# Patient Record
Sex: Female | Born: 1975 | Race: White | Hispanic: No
Health system: Midwestern US, Community
[De-identification: ages and names within clinical notes are randomized; demographics above are authoritative.]

## PROBLEM LIST (undated history)

## (undated) DIAGNOSIS — I10 Essential (primary) hypertension: Secondary | ICD-10-CM

## (undated) DIAGNOSIS — R229 Localized swelling, mass and lump, unspecified: Secondary | ICD-10-CM

## (undated) DIAGNOSIS — D249 Benign neoplasm of unspecified breast: Secondary | ICD-10-CM

## (undated) DIAGNOSIS — IMO0002 Reserved for concepts with insufficient information to code with codable children: Secondary | ICD-10-CM

## (undated) DIAGNOSIS — F419 Anxiety disorder, unspecified: Secondary | ICD-10-CM

## (undated) DIAGNOSIS — T7840XA Allergy, unspecified, initial encounter: Secondary | ICD-10-CM

## (undated) DIAGNOSIS — D649 Anemia, unspecified: Secondary | ICD-10-CM

## (undated) DIAGNOSIS — S96911A Strain of unspecified muscle and tendon at ankle and foot level, right foot, initial encounter: Secondary | ICD-10-CM

## (undated) HISTORY — DX: Benign neoplasm of unspecified breast: D24.9

## (undated) HISTORY — DX: Anxiety disorder, unspecified: F41.9

## (undated) HISTORY — PX: BREAST EXCISIONAL BIOPSY: SUR124

## (undated) HISTORY — DX: Anemia, unspecified: D64.9

## (undated) HISTORY — DX: Allergy, unspecified, initial encounter: T78.40XA

---

## 1998-05-20 ENCOUNTER — Emergency Department (HOSPITAL_COMMUNITY): Admission: EM | Admit: 1998-05-20 | Discharge: 1998-05-20 | Payer: Self-pay | Admitting: Emergency Medicine

## 1998-07-16 ENCOUNTER — Emergency Department (HOSPITAL_COMMUNITY): Admission: EM | Admit: 1998-07-16 | Discharge: 1998-07-16 | Payer: Self-pay | Admitting: Emergency Medicine

## 1998-09-29 ENCOUNTER — Emergency Department (HOSPITAL_COMMUNITY): Admission: EM | Admit: 1998-09-29 | Discharge: 1998-09-29 | Payer: Self-pay | Admitting: Emergency Medicine

## 1999-09-29 ENCOUNTER — Emergency Department (HOSPITAL_COMMUNITY): Admission: EM | Admit: 1999-09-29 | Discharge: 1999-09-29 | Payer: Self-pay | Admitting: Emergency Medicine

## 2000-01-13 ENCOUNTER — Emergency Department (HOSPITAL_COMMUNITY): Admission: EM | Admit: 2000-01-13 | Discharge: 2000-01-13 | Payer: Self-pay | Admitting: Emergency Medicine

## 2000-01-22 ENCOUNTER — Emergency Department (HOSPITAL_COMMUNITY): Admission: EM | Admit: 2000-01-22 | Discharge: 2000-01-22 | Payer: Self-pay

## 2001-05-19 ENCOUNTER — Emergency Department (HOSPITAL_COMMUNITY): Admission: EM | Admit: 2001-05-19 | Discharge: 2001-05-19 | Payer: Self-pay

## 2002-10-12 ENCOUNTER — Emergency Department (HOSPITAL_COMMUNITY): Admission: EM | Admit: 2002-10-12 | Discharge: 2002-10-12 | Payer: Self-pay | Admitting: Emergency Medicine

## 2003-01-31 ENCOUNTER — Emergency Department (HOSPITAL_COMMUNITY): Admission: EM | Admit: 2003-01-31 | Discharge: 2003-01-31 | Payer: Self-pay | Admitting: Emergency Medicine

## 2003-05-03 ENCOUNTER — Ambulatory Visit (HOSPITAL_COMMUNITY): Admission: RE | Admit: 2003-05-03 | Discharge: 2003-05-03 | Payer: Self-pay | Admitting: *Deleted

## 2003-06-08 ENCOUNTER — Ambulatory Visit (HOSPITAL_COMMUNITY): Admission: RE | Admit: 2003-06-08 | Discharge: 2003-06-08 | Payer: Self-pay | Admitting: *Deleted

## 2003-06-08 ENCOUNTER — Encounter: Payer: Self-pay | Admitting: *Deleted

## 2003-09-27 ENCOUNTER — Ambulatory Visit (HOSPITAL_COMMUNITY): Admission: RE | Admit: 2003-09-27 | Discharge: 2003-09-27 | Payer: Self-pay | Admitting: *Deleted

## 2003-10-29 ENCOUNTER — Inpatient Hospital Stay (HOSPITAL_COMMUNITY): Admission: AD | Admit: 2003-10-29 | Discharge: 2003-10-31 | Payer: Self-pay | Admitting: *Deleted

## 2003-10-30 ENCOUNTER — Encounter (INDEPENDENT_AMBULATORY_CARE_PROVIDER_SITE_OTHER): Payer: Self-pay | Admitting: Specialist

## 2003-10-30 HISTORY — PX: TUBAL LIGATION: SHX77

## 2004-12-16 ENCOUNTER — Emergency Department (HOSPITAL_COMMUNITY): Admission: EM | Admit: 2004-12-16 | Discharge: 2004-12-16 | Payer: Self-pay | Admitting: Family Medicine

## 2006-02-15 ENCOUNTER — Emergency Department (HOSPITAL_COMMUNITY): Admission: EM | Admit: 2006-02-15 | Discharge: 2006-02-15 | Payer: Self-pay | Admitting: Emergency Medicine

## 2006-08-10 ENCOUNTER — Emergency Department (HOSPITAL_COMMUNITY): Admission: EM | Admit: 2006-08-10 | Discharge: 2006-08-10 | Payer: Self-pay | Admitting: Emergency Medicine

## 2007-02-22 ENCOUNTER — Emergency Department (HOSPITAL_COMMUNITY): Admission: EM | Admit: 2007-02-22 | Discharge: 2007-02-22 | Payer: Self-pay | Admitting: Family Medicine

## 2008-03-01 ENCOUNTER — Emergency Department (HOSPITAL_COMMUNITY): Admission: EM | Admit: 2008-03-01 | Discharge: 2008-03-01 | Payer: Self-pay | Admitting: Emergency Medicine

## 2008-09-26 ENCOUNTER — Emergency Department (HOSPITAL_COMMUNITY): Admission: EM | Admit: 2008-09-26 | Discharge: 2008-09-26 | Payer: Self-pay | Admitting: Family Medicine

## 2009-05-30 ENCOUNTER — Ambulatory Visit (HOSPITAL_COMMUNITY): Admission: RE | Admit: 2009-05-30 | Discharge: 2009-05-30 | Payer: Self-pay | Admitting: Family Medicine

## 2010-02-20 ENCOUNTER — Emergency Department (HOSPITAL_COMMUNITY): Admission: EM | Admit: 2010-02-20 | Discharge: 2010-02-20 | Payer: Self-pay | Admitting: Family Medicine

## 2010-10-07 ENCOUNTER — Encounter: Payer: Self-pay | Admitting: Cardiology

## 2010-10-07 ENCOUNTER — Encounter: Payer: Self-pay | Admitting: Family Medicine

## 2011-01-31 NOTE — Op Note (Signed)
NAME:  Pamela Allen, HADDOX                         ACCOUNT NO.:  [id number]   MEDICAL RECORD NO.:  [id number]                   PATIENT TYPE:  INP   LOCATION:  9124                                 FACILITY:  [facility]   PHYSICIAN:  Phil D. Okey Dupre, M.D.                  DATE OF BIRTH:  [birth year]   DATE OF PROCEDURE:  10/30/2003  DATE OF DISCHARGE:                                 OPERATIVE REPORT   PREOPERATIVE DIAGNOSIS:  Multiparity.   POSTOPERATIVE DIAGNOSIS:  Multiparity with voluntary sterilization.   PROCEDURE:  Bilateral tubal ligation and partial bilateral salpingectomy.   SURGEON:  Javier Glazier. Rose, M.D.   ESTIMATED BLOOD LOSS:  Less than 5 mL.   ANESTHESIA:  Epidural.   DESCRIPTION OF PROCEDURE:  Under satisfactory epidural anesthesia, with the  patient in the dorsal supine position, the abdomen was prepped and draped in  the usual sterile manner and entered through a 5 cm subumbilical incision.  By entering the peritoneal cavity, the fallopian tubes were identified,  grasped from the mid point and opening made in an avascular portion of the  meso beneath the tube and through that, brought a #1 plain suture which was  tighter over the distal and proximal ends of the tube to form a loop of  approximately 2 cm above the tie.  A second tie was placed just below the  aforementioned tie and cut short.  The first tie, which was held with the  hemostat was also cut short and a portion of the tube above the first tie  was excised and sent for pathological diagnosis and the exposed ends of the  tube which was held by the hemostat were coagulated with hot cautery.  The  ureters were observed for bleeding and none was noted.  The tubes were  replaced in the peritoneal cavity and the fascia and peritoneum closed with  a continuous running 0 Vicryl on an atraumatic needle.  This was run to a  subcutaneous suture closure.  The subcuticular of 3-0 Monocryl was used for  subcuticular closure.  Dry  sterile dressing applied.  The patient  transferred to the recovery room in satisfactory condition having tolerated  the procedure well.                                               Phil D. Okey Dupre, M.D.    PDR/MEDQ  D:  10/30/2003  T:  10/30/2003  Job:  [id number]

## 2011-11-21 ENCOUNTER — Ambulatory Visit (INDEPENDENT_AMBULATORY_CARE_PROVIDER_SITE_OTHER): Payer: BC Managed Care – PPO | Admitting: Family Medicine

## 2011-11-21 ENCOUNTER — Telehealth: Payer: Self-pay | Admitting: Family Medicine

## 2011-11-21 DIAGNOSIS — L0390 Cellulitis, unspecified: Secondary | ICD-10-CM

## 2011-11-21 DIAGNOSIS — L03119 Cellulitis of unspecified part of limb: Secondary | ICD-10-CM

## 2011-11-21 DIAGNOSIS — I10 Essential (primary) hypertension: Secondary | ICD-10-CM | POA: Insufficient documentation

## 2011-11-21 DIAGNOSIS — L03115 Cellulitis of right lower limb: Secondary | ICD-10-CM

## 2011-11-21 MED ORDER — ACETAMINOPHEN-CODEINE #3 300-30 MG PO TABS: 1.0000 | ORAL_TABLET | Freq: Four times a day (QID) | ORAL | Status: AC | PRN

## 2011-11-21 MED ORDER — DOXYCYCLINE HYCLATE 100 MG PO TABS: 100.0000 mg | ORAL_TABLET | Freq: Two times a day (BID) | ORAL | Status: AC

## 2011-11-21 NOTE — Progress Notes (Signed)
  Subjective:    Patient ID: Pamela Allen, female    DOB: 1976/08/13, 36 y.o.   MRN: [id number]  HPI  Patient presents with a 3-4 day history of a painful lesion on her (R) lower extremity. Today with more redness and swelling.  Pain with ambulation.  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  Neck: Neck supple.  Cardiovascular: Normal rate, regular rhythm and normal heart sounds.   Pulmonary/Chest: Effort normal and breath sounds normal.  Musculoskeletal: Normal range of motion.  Neurological: She is alert.  Skin:         Informed consent obtained; anesthesia provided with ethyl chloride; I+D performed with 18 gauge needle.  Wound culture obtained. Patient tolerated procedure without complications    Assessment & Plan:   1. HTN (hypertension)    2. Cellulitis of right leg will small abscess;  Status post  I+D doxycycline (VIBRA-TABS) 100 MG tablet, acetaminophen-codeine (TYLENOL #3) 300-30 MG per tablet, Wound culture, Tdap vaccine greater than or equal to 7yo IM   Silvadene dressing placed. Wound care reviewed Call with follow up in 48 hours, sooner with worsening symptoms.  Patient in agreement with  plan

## 2011-11-21 NOTE — Telephone Encounter (Signed)
Rx for tylenol #3 printed and not given, will call into [facility].

## 2011-11-24 LAB — WOUND CULTURE: Gram Stain: NONE SEEN

## 2011-12-13 ENCOUNTER — Inpatient Hospital Stay: Admit: 2011-12-13 | Discharge: 2011-12-13

## 2011-12-13 MED ORDER — IBUPROFEN 800 MG PO TABS
800 MG | Freq: Once | ORAL | Status: AC
Start: 2011-12-13 — End: 2011-12-13
  Administered 2011-12-13: 22:00:00 via ORAL

## 2011-12-13 MED FILL — IBUPROFEN 800 MG PO TABS: 800 MG | ORAL | Qty: 1

## 2011-12-13 NOTE — Discharge Instructions (Signed)
Contusions  A contusion is an area of tenderness and swelling in the soft tissues. It may also be called a deep bruise. A contusion is the result of damage and a small amount of bleeding in an injured area. Severe contusions may stay painful and swollen for a few weeks following injury.    HOME CARE INSTRUCTIONS   Rest the injured area until the pain and swelling are better.    Apply ice packs every few hours for 2 to 3 days, then use moist heat for comfort.    Elevate the injury to reduce swelling.    Compression bandages may also help reduce swelling and motion, which may help control pain.   A larger collection of blood may form in the deep tissue (hematoma). This may form with a large contusion. Hematomas are usually reabsorbed by the body naturally. Sometimes they need to be drained.   SEEK IMMEDIATE MEDICAL CARE IF YOU DEVELOP:   Signs of infection (increased redness, swelling, or pain).    Numbness or coldness to the injured area.   MAKE SURE YOU:    Understand these instructions.    Will watch your condition.    Will get help right away if you are not doing well or get worse.   Document Released: 10/09/2004 Document Re-Released: 11/26/2009  [employer] Patient Information 2012 [employer].
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
## 2011-12-13 NOTE — ED Notes (Signed)
Derek PA in room to examine pt. No drug screen / BAT test required per Lt. In room with pt.    Cherylin Mylar, RN  12/13/11 [phone]

## 2011-12-13 NOTE — ED Notes (Signed)
RUE:AV40<JW> Expected date:<BR> Expected time:<BR> Means of arrival:<BR> Comments:<BR>

## 2011-12-13 NOTE — ED Notes (Signed)
Pt to x-ray per ambulation.    Regina Potter, [place]  12/13/11 [phone]
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
## 2012-04-17 ENCOUNTER — Ambulatory Visit (INDEPENDENT_AMBULATORY_CARE_PROVIDER_SITE_OTHER): Payer: BC Managed Care – PPO | Admitting: Family Medicine

## 2012-04-17 VITALS — BP 133/86 | HR 99 | Temp 99.0°F | Resp 16 | Ht 62.5 in | Wt 184.0 lb

## 2012-04-17 DIAGNOSIS — R070 Pain in throat: Secondary | ICD-10-CM

## 2012-04-17 DIAGNOSIS — E0789 Other specified disorders of thyroid: Secondary | ICD-10-CM

## 2012-04-17 LAB — POCT CBC
Granulocyte percent: 55.8 %G (ref 37–80)
Hemoglobin: 12.2 g/dL (ref 12.2–16.2)
MPV: 9.3 fL (ref 0–99.8)
POC Granulocyte: 5.4 (ref 2–6.9)
POC MID %: 6.5 %M (ref 0–12)
Platelet Count, POC: 328 10*3/uL (ref 142–424)
RBC: 4.22 M/uL (ref 4.04–5.48)

## 2012-04-17 LAB — POCT RAPID STREP A (OFFICE): Rapid Strep A Screen: NEGATIVE

## 2012-04-17 MED ORDER — PREDNISONE 20 MG PO TABS: ORAL_TABLET | ORAL | Status: AC

## 2012-04-17 NOTE — Progress Notes (Signed)
Subjective: Patient been having pain in her right side of her neck since yesterday. It's not like her usual sore throat. No fever. It hurts coughs or swallows hard.  Objective: TMs normal. Throat clear. Neck supple without nodes. He is tender in the right side of her thyroid. Chest clear. Heart regular without murmurs. Strep screen was taken anyhow.  Assessment: Throat pain Thyroid pain Possible thyroiditis  Plan: Strep screen and labs Results for orders placed in visit on 04/17/12  POCT CBC      Component Value Range   WBC 9.7  4.6 - 10.2 K/uL   Lymph, poc 3.7 (*) 0.6 - 3.4   POC LYMPH PERCENT 37.7  10 - 50 %L   MID (cbc) 0.6  0 - 0.9   POC MID % 6.5  0 - 12 %M   POC Granulocyte 5.4  2 - 6.9   Granulocyte percent 55.8  37 - 80 %G   RBC 4.22  4.04 - 5.48 M/uL   Hemoglobin 12.2  12.2 - 16.2 g/dL   HCT, POC 16.1  09.6 - 47.9 %   MCV 94.7  80 - 97 fL   MCH, POC 28.9  27 - 31.2 pg   MCHC 30.5 (*) 31.8 - 35.4 g/dL   RDW, POC 04.5     Platelet Count, POC 328  142 - 424 K/uL   MPV 9.3  0 - 99.8 fL  POCT RAPID STREP A (OFFICE)      Component Value Range   Rapid Strep A Screen Negative  Negative   Sedimentation rate is pending. Will give her a brief course of steroids and see if that helps. All other labs are pending.

## 2012-04-17 NOTE — Patient Instructions (Addendum)
Take medication as ordered. I will let you know the results of your other labs in a few days. If problems persist we may need to do further testing on the thyroid. I think this is a possible thyroiditis.  If not improving over the next week come back and get rechecked.

## 2012-04-18 LAB — THYROID PANEL WITH TSH
Free Thyroxine Index: 2.4 (ref 1.0–3.9)
T3 Uptake: 32.4 % (ref 22.5–37.0)
T4, Total: 7.3 ug/dL (ref 5.0–12.5)

## 2012-04-19 ENCOUNTER — Encounter: Payer: Self-pay | Admitting: Radiology

## 2012-06-11 ENCOUNTER — Ambulatory Visit: Payer: BC Managed Care – PPO

## 2012-06-11 ENCOUNTER — Ambulatory Visit (INDEPENDENT_AMBULATORY_CARE_PROVIDER_SITE_OTHER): Payer: BC Managed Care – PPO | Admitting: Emergency Medicine

## 2012-06-11 VITALS — BP 118/72 | HR 85 | Temp 98.5°F | Resp 16 | Ht 62.5 in | Wt 185.0 lb

## 2012-06-11 DIAGNOSIS — R079 Chest pain, unspecified: Secondary | ICD-10-CM

## 2012-06-11 MED ORDER — HYDROCODONE-ACETAMINOPHEN 5-325 MG PO TABS: 1.0000 | ORAL_TABLET | ORAL | Status: AC | PRN

## 2012-06-11 MED ORDER — NAPROXEN SODIUM 550 MG PO TABS: 550.0000 mg | ORAL_TABLET | Freq: Two times a day (BID) | ORAL | Status: DC

## 2012-06-11 NOTE — Progress Notes (Signed)
Subjective:    Patient ID: Pamela Allen, female    DOB: 1976/01/10, 36 y.o.   MRN: [id number]  HPI Comments: Comes and goes "for a minute or so" over last two weeks.  No history of injury or overuse.    Chest Pain  This is a new problem. The current episode started 1 to 4 weeks ago. The onset quality is sudden. The problem occurs 2 to 4 times per day. The problem has been unchanged. The pain is present in the lateral region. The pain is at a severity of 5/10. The pain is moderate. The quality of the pain is described as sharp and stabbing. The pain does not radiate. Pertinent negatives include no abdominal pain, back pain, claudication, cough, diaphoresis, dizziness, exertional chest pressure, fever, headaches, hemoptysis, irregular heartbeat, leg pain, lower extremity edema, malaise/fatigue, nausea, near-syncope, numbness, orthopnea, palpitations, PND, shortness of breath, sputum production, syncope, vomiting or weakness. The pain is aggravated by nothing. She has tried nothing for the symptoms. Risk factors include obesity.  Her past medical history is significant for hypertension.  Pertinent negatives for past medical history include no aneurysm, no anxiety/panic attacks, no aortic aneurysm, no aortic dissection, no arrhythmia, no bicuspid aortic valve, no CAD, no cancer, no congenital heart disease, no connective tissue disease, no COPD, no CHF, no diabetes, no DVT, no hyperlipidemia, no Kawasaki disease, no Marfan's syndrome, no MI, no mitral valve prolapse, no pacemaker, no PE, no PVD, no recent injury, no rheumatic fever, no seizures, no sickle cell disease, no sleep apnea, no spontaneous pneumothorax, no stimulant use, no strokes, no TIA, Turner syndrome and no valve disorder.  Her family medical history is significant for diabetes in family.  Pertinent negatives for family medical history include: family history of aortic dissection, no CAD in family, no connective tissue disease in family, no  heart disease in family, no hyperlipidemia in family, no hypertension in family, no Marfan's syndrome in family, no early MI in family, no PE in family, no PVD in family, no sickle cell disease in family, no stroke in family, no sudden death in family and no TIA in family.      Review of Systems  Constitutional: Negative.  Negative for fever, malaise/fatigue and diaphoresis.  Eyes: Negative.   Respiratory: Negative.  Negative for cough, hemoptysis, sputum production and shortness of breath.   Cardiovascular: Positive for chest pain. Negative for palpitations, orthopnea, claudication, syncope, PND and near-syncope.  Gastrointestinal: Negative.  Negative for nausea, vomiting and abdominal pain.  Genitourinary: Negative.   Musculoskeletal: Negative.  Negative for back pain.  Neurological: Negative.  Negative for dizziness, seizures, weakness, numbness and headaches.       Objective:   Physical Exam  Constitutional: She is oriented to person, place, and time. She appears well-developed and well-nourished.  HENT:  Head: Normocephalic and atraumatic.  Eyes: Conjunctivae normal are normal. Pupils are equal, round, and reactive to light. No scleral icterus.  Neck: Normal range of motion. Neck supple.  Cardiovascular: Normal rate, regular rhythm and normal heart sounds.   Pulmonary/Chest: Effort normal and breath sounds normal. No respiratory distress. She has no wheezes. She has no rales. She exhibits tenderness.  Abdominal: Soft. Bowel sounds are normal. She exhibits no distension.  Musculoskeletal: Normal range of motion.  Neurological: She is alert and oriented to person, place, and time.  Skin: Skin is warm and dry.          Assessment & Plan:  Pleuritic chest pain CXR  [facility] reading (  PRIMARY) by  Dr. Dareen Piano.  negative.  Meds ordered this encounter  Medications  . HYDROcodone-acetaminophen (NORCO) 5-325 MG per tablet    Sig: Take 1-2 tablets by mouth every 4 (four) hours as  needed for pain.    Dispense:  30 tablet    Refill:  0  . naproxen sodium (ANAPROX DS) 550 MG tablet    Sig: Take 1 tablet (550 mg total) by mouth 2 (two) times daily with a meal.    Dispense:  40 tablet    Refill:  0   I have reviewed and agree with documentation. Robert P. Merla Riches, M.D.

## 2013-04-30 ENCOUNTER — Encounter: Payer: Self-pay | Admitting: Family Medicine

## 2013-04-30 ENCOUNTER — Ambulatory Visit (INDEPENDENT_AMBULATORY_CARE_PROVIDER_SITE_OTHER): Payer: Managed Care, Other (non HMO) | Admitting: Family Medicine

## 2013-04-30 VITALS — BP 134/90 | HR 82 | Temp 99.0°F | Resp 16 | Ht 62.0 in | Wt 186.0 lb

## 2013-04-30 DIAGNOSIS — I10 Essential (primary) hypertension: Secondary | ICD-10-CM

## 2013-04-30 DIAGNOSIS — E669 Obesity, unspecified: Secondary | ICD-10-CM

## 2013-04-30 DIAGNOSIS — Z79899 Other long term (current) drug therapy: Secondary | ICD-10-CM

## 2013-04-30 MED ORDER — AMLODIPINE BESYLATE-VALSARTAN 10-320 MG PO TABS: 1.0000 | ORAL_TABLET | Freq: Every day | ORAL | Status: DC

## 2013-04-30 NOTE — Progress Notes (Signed)
  Subjective:    Patient ID: Pamela Allen, female    DOB: 15-Mar-1976, 37 y.o.   MRN: [id number] Chief Complaint  Patient presents with  . Medication Refill    ran out of bp med two days ago   HPI  Has switched her PCP to [facility] but her year long rx from her prior PCP just ran out.  Does check her BP at home and always runs about 130/89.  Here today with her fiance.  No problems tolerating the exforge 5/320. Has never been on a higher dose of it.  Plans to make an appt at 104 for a full CPE soon - is not fasting today.  History reviewed. No pertinent past medical history. No current outpatient prescriptions on file prior to visit.   No current facility-administered medications on file prior to visit.   No Known Allergies   Review of Systems  Constitutional: Negative for fever, chills, diaphoresis and appetite change.  Eyes: Negative for visual disturbance.  Respiratory: Negative for cough and shortness of breath.   Cardiovascular: Negative for chest pain, palpitations and leg swelling.  Genitourinary: Negative for decreased urine volume.  Neurological: Negative for syncope and headaches.  Hematological: Does not bruise/bleed easily.      BP 134/90  Pulse 82  Temp(Src) 99 F (37.2 C) (Oral)  Resp 16  Ht 5\' 2"  (1.575 m)  Wt 186 lb (84.369 kg)  BMI 34.01 kg/m2  SpO2 100%  LMP 04/21/2013 Objective:   Physical Exam  Constitutional: She is oriented to person, place, and time. She appears well-developed and well-nourished. No distress.  HENT:  Head: Normocephalic and atraumatic.  Right Ear: External ear normal.  Left Ear: External ear normal.  Eyes: Conjunctivae are normal. No scleral icterus.  Neck: Normal range of motion. Neck supple. No thyromegaly present.  Cardiovascular: Normal rate, regular rhythm, normal heart sounds and intact distal pulses.   Pulmonary/Chest: Effort normal and breath sounds normal. No respiratory distress.  Musculoskeletal: She exhibits no edema.   Lymphadenopathy:    She has no cervical adenopathy.  Neurological: She is alert and oriented to person, place, and time.  Skin: Skin is warm and dry. She is not diaphoretic. No erythema.  Psychiatric: She has a normal mood and affect. Her behavior is normal.      Assessment & Plan:  HTN (hypertension) - increase exforge from 5/320 to 10/320 - decrease back to prior dose if more lower ext edema.  Encounter for long-term (current) use of other medications - Plan: Comprehensive metabolic panel  Obesity, unspecified - Plan: TSH  Meds ordered this encounter  Medications  . amLODipine-valsartan (EXFORGE) 10-320 MG per tablet    Sig: Take 1 tablet by mouth daily.    Dispense:  90 tablet    Refill:  3   F/u at 104 within the next yr for BP recheck and CPE.

## 2013-04-30 NOTE — Patient Instructions (Addendum)
Continue checking your blood pressure at home about once a week. Make sure your arm is resting on the arm of the couch or chair or on the table and you have been sitting relaxed for several minutes before taking it. We want your blood pressure to be <120/80 on both upper and lower numbers.  DASH Diet The DASH diet stands for "Dietary Approaches to Stop Hypertension." It is a healthy eating plan that has been shown to reduce high blood pressure (hypertension) in as little as 14 days, while also possibly providing other significant health benefits. These other health benefits include reducing the risk of breast cancer after menopause and reducing the risk of type 2 diabetes, heart disease, colon cancer, and stroke. Health benefits also include weight loss and slowing kidney failure in patients with chronic kidney disease.  DIET GUIDELINES  Limit salt (sodium). Your diet should contain less than 1500 mg of sodium daily.  Limit refined or processed carbohydrates. Your diet should include mostly whole grains. Desserts and added sugars should be used sparingly.  Include small amounts of heart-healthy fats. These types of fats include nuts, oils, and tub margarine. Limit saturated and trans fats. These fats have been shown to be harmful in the body. CHOOSING FOODS  The following food groups are based on a 2000 calorie diet. See your Registered Dietitian for individual calorie needs. Grains and Grain Products (6 to 8 servings daily)  Eat More Often: Whole-wheat bread, brown rice, whole-grain or wheat pasta, quinoa, popcorn without added fat or salt (air popped).  Eat Less Often: White bread, white pasta, white rice, cornbread. Vegetables (4 to 5 servings daily)  Eat More Often: Fresh, frozen, and canned vegetables. Vegetables may be raw, steamed, roasted, or grilled with a minimal amount of fat.  Eat Less Often/Avoid: Creamed or fried vegetables. Vegetables in a cheese sauce. Fruit (4 to 5 servings  daily)  Eat More Often: All fresh, canned (in natural juice), or frozen fruits. Dried fruits without added sugar. One hundred percent fruit juice ( cup [237 mL] daily).  Eat Less Often: Dried fruits with added sugar. Canned fruit in light or heavy syrup. [employer], Fish, and Poultry (2 servings or less daily. One serving is 3 to 4 oz [85-114 g]).  Eat More Often: Ninety percent or leaner ground beef, tenderloin, sirloin. Round cuts of beef, chicken breast, [place] breast. All fish. Grill, bake, or broil your meat. Nothing should be fried.  Eat Less Often/Avoid: Fatty cuts of meat, [place], or chicken leg, thigh, or wing. Fried cuts of meat or fish. Dairy (2 to 3 servings)  Eat More Often: Low-fat or fat-free milk, low-fat plain or light yogurt, reduced-fat or part-skim cheese.  Eat Less Often/Avoid: Milk (whole, 2%).Whole milk yogurt. Full-fat cheeses. Nuts, Seeds, and Legumes (4 to 5 servings per week)  Eat More Often: All without added salt.  Eat Less Often/Avoid: Salted nuts and seeds, canned beans with added salt. Fats and Sweets (limited)  Eat More Often: Vegetable oils, tub margarines without trans fats, sugar-free gelatin. Mayonnaise and salad dressings.  Eat Less Often/Avoid: Coconut oils, palm oils, butter, stick margarine, cream, half and half, cookies, candy, pie. FOR MORE INFORMATION The Dash Diet Eating Plan: www.dashdiet.org Document Released: 08/21/2011 Document Revised: 11/24/2011 Document Reviewed: 08/21/2011 [facility] Patient Information 2014 [place], [place].

## 2013-05-01 LAB — COMPREHENSIVE METABOLIC PANEL
ALT: 16 U/L (ref 0–35)
AST: 16 U/L (ref 0–37)
Albumin: 4.4 g/dL (ref 3.5–5.2)
Alkaline Phosphatase: 45 U/L (ref 39–117)
Calcium: 9.3 mg/dL (ref 8.4–10.5)
Chloride: 103 mEq/L (ref 96–112)
Potassium: 4.1 mEq/L (ref 3.5–5.3)

## 2013-05-02 ENCOUNTER — Encounter: Payer: Self-pay | Admitting: Family Medicine

## 2013-08-04 IMAGING — CR DG CHEST 2V
2 series · 2 of 2 positions shown · non-contrast
Comparison: 12/16/2004.

CLINICAL DATA: Intermittent left chest pain.

CHEST - 2 VIEW

[PA]
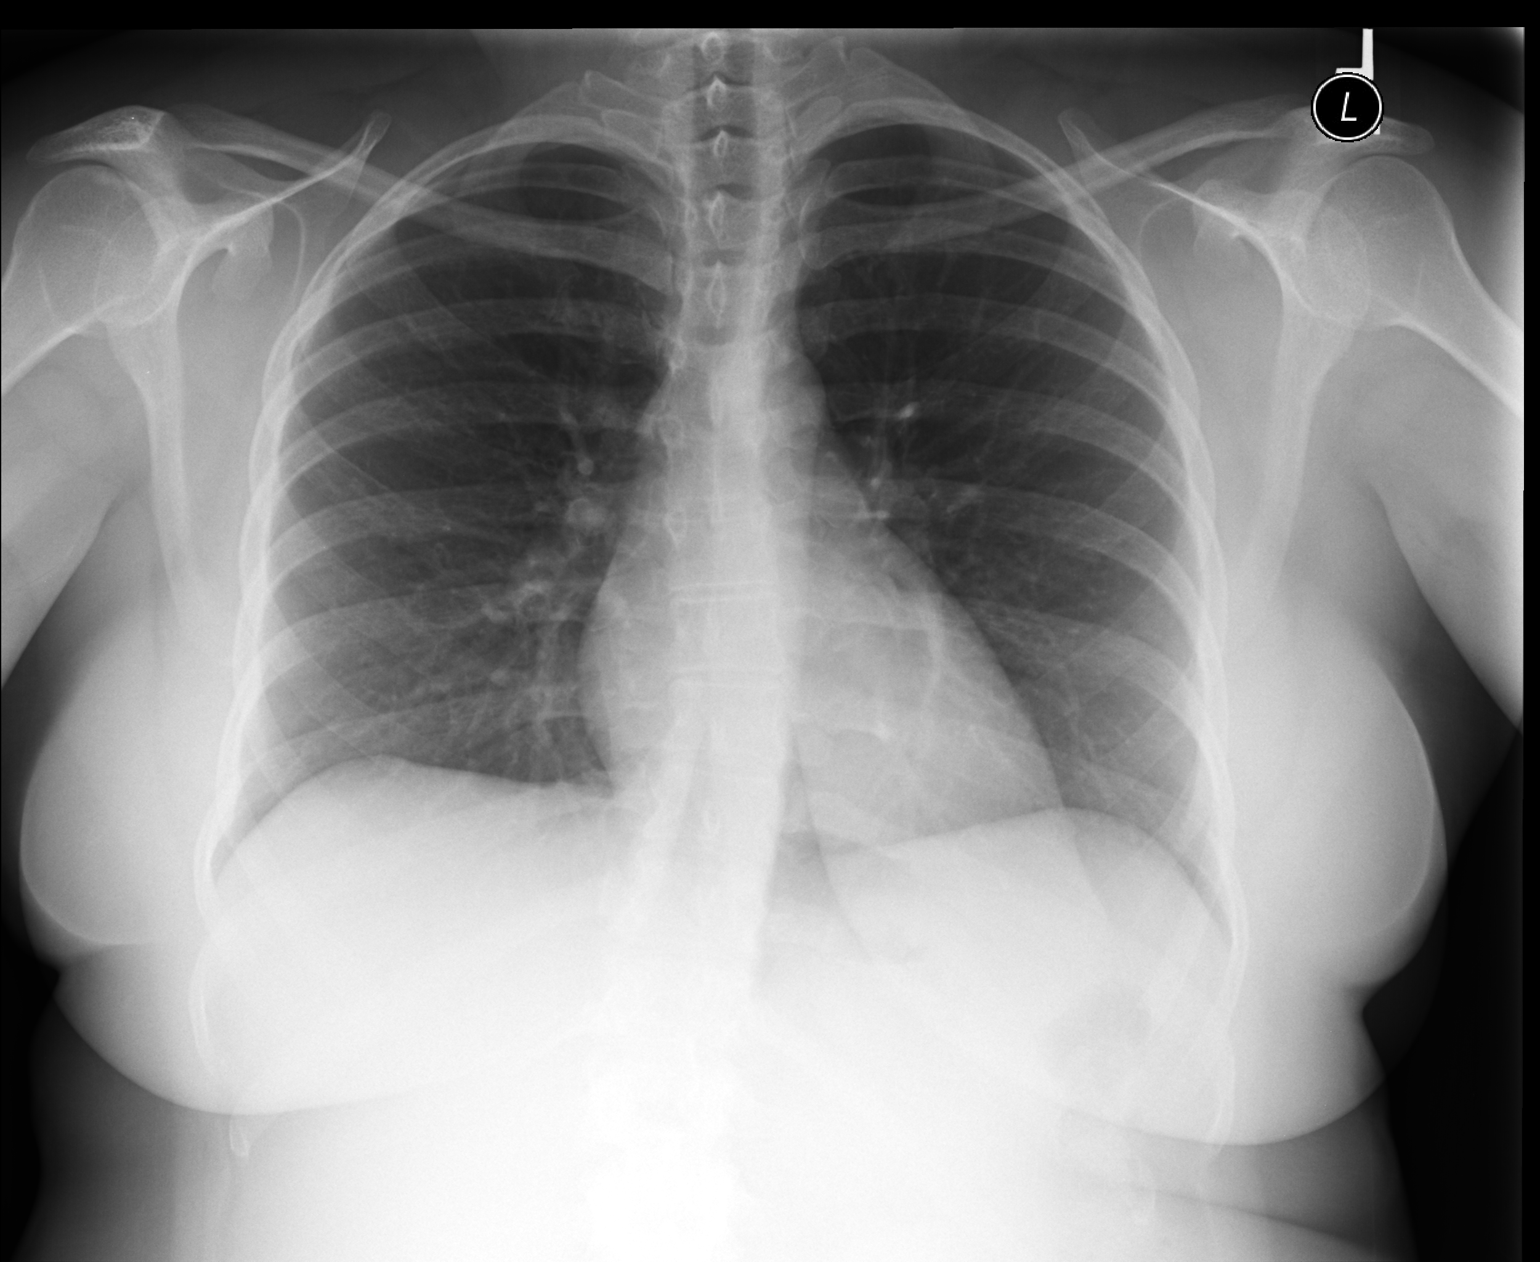

[lateral]
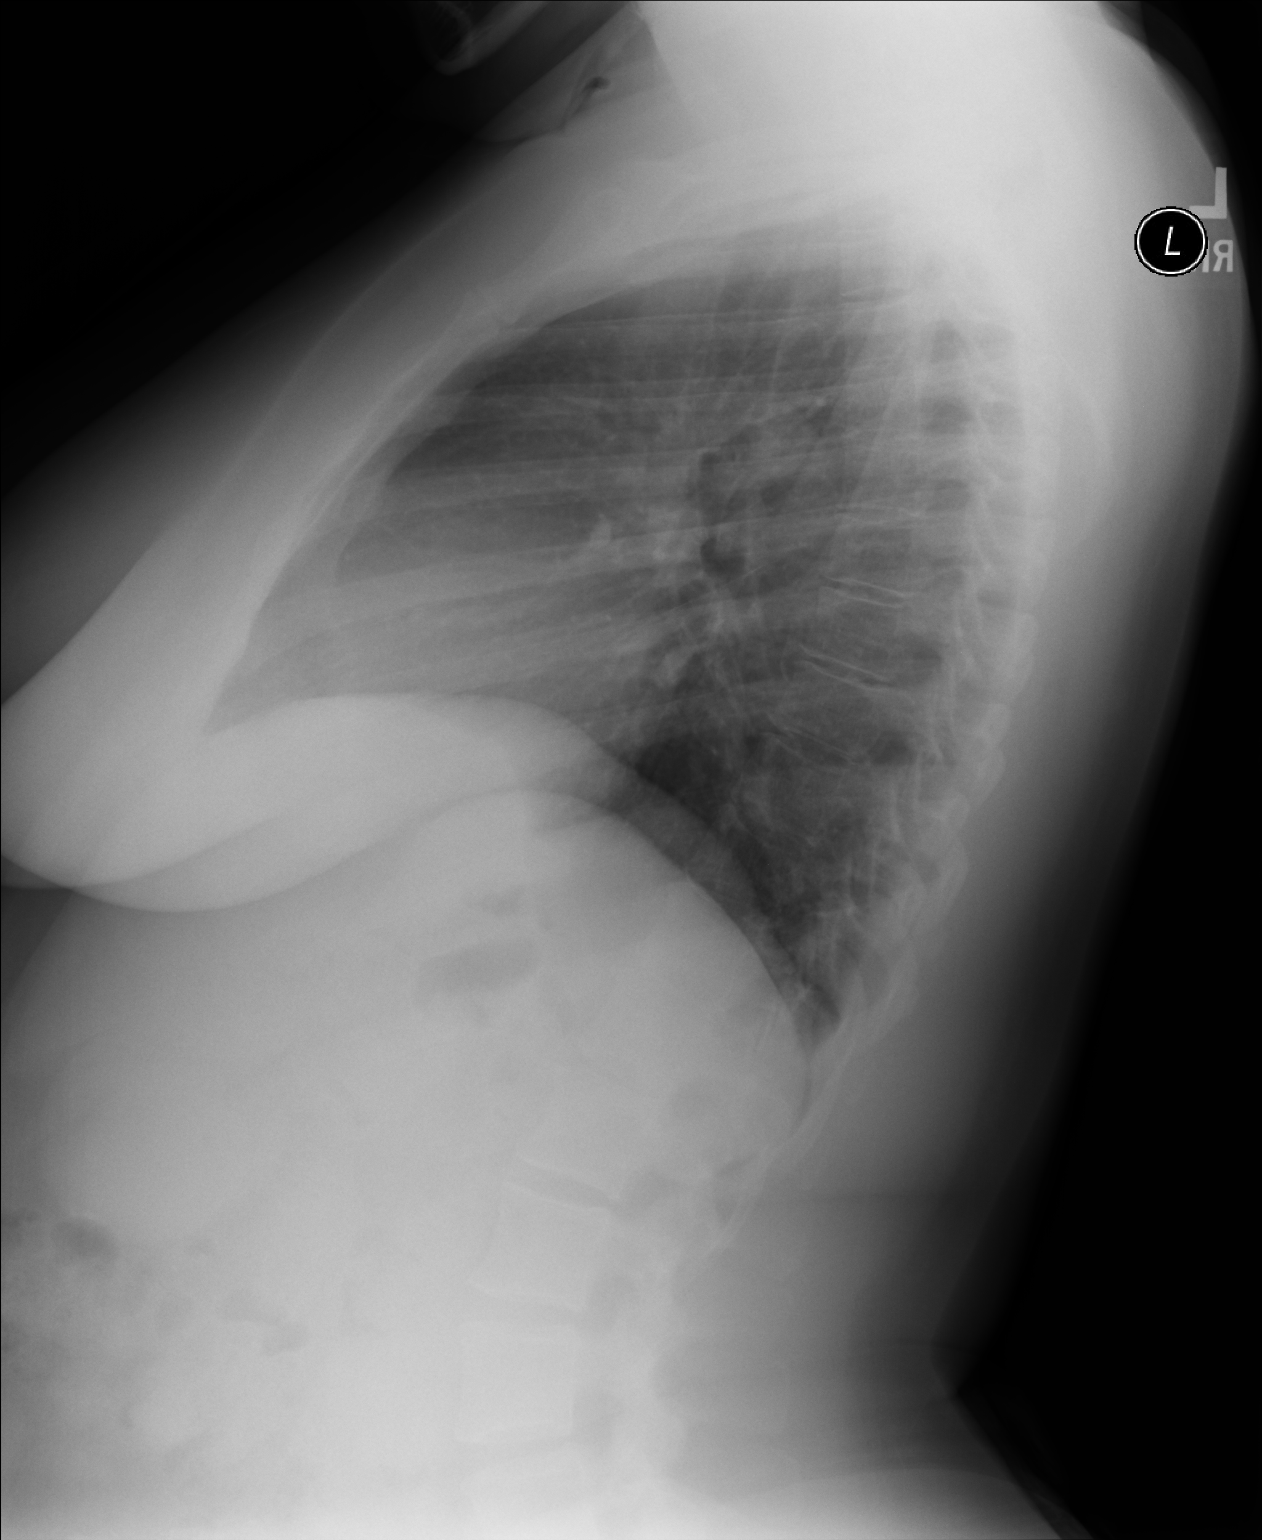

[2 of 2 positions shown; findings below may reference images not displayed]

FINDINGS: Trachea is midline.  Heart size normal.  Lungs are clear.
No pleural fluid.
IMPRESSION: No acute findings.

## 2013-11-07 DIAGNOSIS — G5600 Carpal tunnel syndrome, unspecified upper limb: Secondary | ICD-10-CM | POA: Insufficient documentation

## 2013-11-07 DIAGNOSIS — R209 Unspecified disturbances of skin sensation: Secondary | ICD-10-CM | POA: Insufficient documentation

## 2013-11-07 DIAGNOSIS — I10 Essential (primary) hypertension: Secondary | ICD-10-CM | POA: Insufficient documentation

## 2013-11-07 DIAGNOSIS — Z79899 Other long term (current) drug therapy: Secondary | ICD-10-CM | POA: Insufficient documentation

## 2013-11-08 ENCOUNTER — Encounter (HOSPITAL_COMMUNITY): Payer: Self-pay | Admitting: Emergency Medicine

## 2013-11-08 ENCOUNTER — Emergency Department (HOSPITAL_COMMUNITY)
Admission: EM | Admit: 2013-11-08 | Discharge: 2013-11-08 | Disposition: A | Discharge status: Home / Self Care | Payer: Managed Care, Other (non HMO) | Attending: Emergency Medicine | Admitting: Emergency Medicine

## 2013-11-08 DIAGNOSIS — G5602 Carpal tunnel syndrome, left upper limb: Secondary | ICD-10-CM

## 2013-11-08 HISTORY — DX: Essential (primary) hypertension: I10

## 2013-11-08 MED ORDER — NAPROXEN 500 MG PO TABS: 500.0000 mg | ORAL_TABLET | Freq: Two times a day (BID) | ORAL | Status: DC

## 2013-11-08 NOTE — ED Provider Notes (Signed)
CSN: [id number]     Arrival date & time 11/07/13  2322 History   First MD Initiated Contact with Patient 11/08/13 [phone]     Chief Complaint  Patient presents with  . Arm Pain     (Consider location/radiation/quality/duration/timing/severity/associated sxs/prior Treatment) HPI Comments: Patient states, that she's noticed for the past several weeks, that she's had some discomfort in the medial aspect of her right wrist.  That has progressively gotten worse for the last 2, days.  She's had discomfort.  That radiated up into the upper portion of her right arm.  At making in nature, cannot be reproduced with palpation, but with certain movements, has pain stops at the shoulder.  She has tried Advil with some relief.  Denies any injury. She says, occasionally, she'll feel a tingly sensation, like her arm is asleep, but has full range of motion and function  Patient is a 38 y.o. female presenting with arm pain. The history is provided by the patient.  Arm Pain This is a new problem. The current episode started in the past 7 days. The problem occurs constantly. The problem has been gradually worsening. Associated symptoms include numbness. Pertinent negatives include no joint swelling, rash or weakness. The symptoms are aggravated by exertion. She has tried NSAIDs for the symptoms. The treatment provided mild relief.    Past Medical History  Diagnosis Date  . Hypertension    History reviewed. No pertinent past surgical history. History reviewed. No pertinent family history. History  Substance Use Topics  . Smoking status: Never Smoker   . Smokeless tobacco: Not on file  . Alcohol Use: No   OB History   Grav Para Term Preterm Abortions TAB SAB Ect Mult Living                 Review of Systems  Musculoskeletal: Negative for joint swelling.  Skin: Negative for rash and wound.  Neurological: Positive for numbness. Negative for weakness.      Allergies  Review of patient's allergies  indicates no known allergies.  Home Medications   Current Outpatient Rx  Name  Route  Sig  Dispense  Refill  . amLODipine-valsartan (EXFORGE) 10-320 MG per tablet   Oral   Take 1 tablet by mouth daily.   90 tablet   3   . naproxen (NAPROSYN) 500 MG tablet   Oral   Take 1 tablet (500 mg total) by mouth 2 (two) times daily.   30 tablet   0    BP 155/96  Pulse 85  Temp(Src) 98.1 F (36.7 C) (Oral)  Resp 20  SpO2 100%  LMP 10/10/2013 Physical Exam  Nursing note and vitals reviewed. Constitutional: She is oriented to person, place, and time. She appears well-developed and well-nourished.  HENT:  Head: Normocephalic.  Eyes: Pupils are equal, round, and reactive to light.  Neck: Normal range of motion.  Cardiovascular: Normal rate.   Pulmonary/Chest: Effort normal.  Musculoskeletal: Normal range of motion. She exhibits tenderness. She exhibits no edema.       Right wrist: She exhibits tenderness. She exhibits no bony tenderness.  Positive Tinel sign  Neurological: She is alert and oriented to person, place, and time.  Skin: Skin is warm. No rash noted. No erythema.    ED Course  Procedures (including critical care time) Labs Review Labs Reviewed - No data to display Imaging Review No results found.  EKG Interpretation   None       MDM   Final diagnoses:  Carpal tunnel syndrome on left        Garald Balding, NP 11/08/13 2047  Garald Balding, NP 11/08/13 2047

## 2013-11-08 NOTE — ED Provider Notes (Signed)
Medical screening examination/treatment/procedure(s) were performed by non-physician practitioner and as supervising physician I was immediately available for consultation/collaboration.    Teressa Lower, MD 11/08/13 [phone]

## 2013-11-08 NOTE — ED Notes (Signed)
Pt complains of right arm numbness that started earlier today

## 2013-11-08 NOTE — Discharge Instructions (Signed)
Carpal Tunnel Syndrome The carpal tunnel is an area under the skin of the palm of your hand. Nerves, blood vessels, and strong tissues (tendons) pass through the tunnel. The tunnel can become puffy (swollen). If this happens, a nerve can be pinched in the wrist. This causes carpal tunnel syndrome.  HOME CARE  Take all medicine as told by your doctor.  If you were given a splint, wear it as told. Wear it at night or at times when your doctor told you to.  Rest your wrist from the activity that causes your pain.  Put ice on your wrist after long periods of wrist activity.  Put ice in a plastic bag.  Place a towel between your skin and the bag.  Leave the ice on for 15-20 minutes, 03-04 times a day.  Keep all doctor visits as told. GET HELP RIGHT AWAY IF:  You have new problems you cannot explain.  Your problems get worse and medicine does not help. MAKE SURE YOU:   Understand these instructions.  Will watch your condition.  Will get help right away if you are not doing well or get worse. Document Released: 08/21/2011 Document Revised: 11/24/2011 Document Reviewed: 08/21/2011 [facility] Patient Information 2014 [place], [place]. Please wear the splint provided 24 hours a day for the next week and then while up and about and at work.  He may remove it at night.  He may remove it to bed during the first week, but it should be worn at all times.  If this does not improve your discomfort.  Please make an appointment with Dr. Grandville Silos for further evaluation

## 2014-01-03 ENCOUNTER — Ambulatory Visit (INDEPENDENT_AMBULATORY_CARE_PROVIDER_SITE_OTHER): Payer: Managed Care, Other (non HMO) | Admitting: Emergency Medicine

## 2014-01-03 VITALS — BP 134/80 | HR 86 | Temp 98.0°F | Resp 16 | Ht 65.0 in | Wt 190.0 lb

## 2014-01-03 DIAGNOSIS — L259 Unspecified contact dermatitis, unspecified cause: Secondary | ICD-10-CM

## 2014-01-03 MED ORDER — TRIAMCINOLONE ACETONIDE 0.1 % EX CREA: 1.0000 "application " | TOPICAL_CREAM | Freq: Two times a day (BID) | CUTANEOUS | Status: DC

## 2014-01-03 MED ORDER — METHYLPREDNISOLONE ACETATE 80 MG/ML IJ SUSP
120.0000 mg | Freq: Once | INTRAMUSCULAR | Status: AC
  Administered 2014-01-03: 120 mg via INTRAMUSCULAR

## 2014-01-03 NOTE — Patient Instructions (Signed)
ContactContact Dermatitis Contact dermatitis is a reaction to certain substances that touch the skin. Contact dermatitis can be either irritant contact dermatitis or allergic contact dermatitis. Irritant contact dermatitis does not require previous exposure to the substance for a reaction to occur.Allergic contact dermatitis only occurs if you have been exposed to the substance before. Upon a repeat exposure, your body reacts to the substance.  CAUSES  Many substances can cause contact dermatitis. Irritant dermatitis is most commonly caused by repeated exposure to mildly irritating substances, such as:  Makeup.  Soaps.  Detergents.  Bleaches.  Acids.  Metal salts, such as nickel. Allergic contact dermatitis is most commonly caused by exposure to:  Poisonous plants.  Chemicals (deodorants, shampoos).  Jewelry.  Latex.  Neomycin in triple antibiotic cream.  Preservatives in products, including clothing. SYMPTOMS  The area of skin that is exposed may develop:  Dryness or flaking.  Redness.  Cracks.  Itching.  Pain or a burning sensation.  Blisters. With allergic contact dermatitis, there may also be swelling in areas such as the eyelids, mouth, or genitals.  DIAGNOSIS  Your caregiver can usually tell what the problem is by doing a physical exam. In cases where the cause is uncertain and an allergic contact dermatitis is suspected, a patch skin test may be performed to help determine the cause of your dermatitis. TREATMENT Treatment includes protecting the skin from further contact with the irritating substance by avoiding that substance if possible. Barrier creams, powders, and gloves may be helpful. Your caregiver may also recommend:  Steroid creams or ointments applied 2 times daily. For best results, soak the rash area in cool water for 20 minutes. Then apply the medicine. Cover the area with a plastic wrap. You can store the steroid cream in the refrigerator for a  "chilly" effect on your rash. That may decrease itching. Oral steroid medicines may be needed in more severe cases.  Antibiotics or antibacterial ointments if a skin infection is present.  Antihistamine lotion or an antihistamine taken by mouth to ease itching.  Lubricants to keep moisture in your skin.  Burow's solution to reduce redness and soreness or to dry a weeping rash. Mix one packet or tablet of solution in 2 cups cool water. Dip a clean washcloth in the mixture, wring it out a bit, and put it on the affected area. Leave the cloth in place for 30 minutes. Do this as often as possible throughout the day.  Taking several cornstarch or baking soda baths daily if the area is too large to cover with a washcloth. Harsh chemicals, such as alkalis or acids, can cause skin damage that is like a burn. You should flush your skin for 15 to 20 minutes with cold water after such an exposure. You should also seek immediate medical care after exposure. Bandages (dressings), antibiotics, and pain medicine may be needed for severely irritated skin.  HOME CARE INSTRUCTIONS  Avoid the substance that caused your reaction.  Keep the area of skin that is affected away from hot water, soap, sunlight, chemicals, acidic substances, or anything else that would irritate your skin.  Do not scratch the rash. Scratching may cause the rash to become infected.  You may take cool baths to help stop the itching.  Only take over-the-counter or prescription medicines as directed by your caregiver.  See your caregiver for follow-up care as directed to make sure your skin is healing properly. SEEK MEDICAL CARE IF:   Your condition is not better after 3  days of treatment.  You seem to be getting worse.  You see signs of infection such as swelling, tenderness, redness, soreness, or warmth in the affected area.  You have any problems related to your medicines. Document Released: 08/29/2000 Document Revised:  11/24/2011 Document Reviewed: 02/04/2011 [facility] Patient Information 2014 [place], [place].

## 2014-01-03 NOTE — Progress Notes (Signed)
[facility] [address], [place] [address] [phone]  Date:  01/03/2014   Name:  Pamela Allen   DOB:  [birth year]   MRN:  [id number]  PCP:  Delman Cheadle, MD    Chief Complaint: Rash   History of Present Illness:  Pamela Allen is a 38 y.o. very pleasant female patient who presents with the following:  Used a weight loss band the last two days.  How has a rather intense red and swollen and highly pruritic rash in the area of the rubber band.  No dysphagia or difficulty swallowing or breathing. No cough or coryza.  No improvement with over the counter medications or other home remedies. Denies other complaint or health concern today.   Patient Active Problem List   Diagnosis Date Noted  . HTN (hypertension) 11/21/2011    Past Medical History  Diagnosis Date  . Hypertension     No past surgical history on file.  History  Substance Use Topics  . Smoking status: Never Smoker   . Smokeless tobacco: Not on file  . Alcohol Use: No    No family history on file.  No Known Allergies  Medication list has been reviewed and updated.  Current Outpatient Prescriptions on File Prior to Visit  Medication Sig Dispense Refill  . amLODipine-valsartan (EXFORGE) 10-320 MG per tablet Take 1 tablet by mouth daily.  90 tablet  3  . naproxen (NAPROSYN) 500 MG tablet Take 1 tablet (500 mg total) by mouth 2 (two) times daily.  30 tablet  0   No current facility-administered medications on file prior to visit.    Review of Systems:  As per HPI, otherwise negative.    Physical Examination: Filed Vitals:   01/03/14 1908  BP: 134/80  Pulse: 86  Temp: 98 F (36.7 C)  Resp: 16   Filed Vitals:   01/03/14 1908  Height: 5' 5"  (1.651 m)  Weight: 190 lb (86.183 kg)   Body mass index is 31.62 kg/(m^2). Ideal Body Weight: Weight in (lb) to have BMI = 25: 149.9  GEN: WDWN, NAD, Non-toxic, A & O x 3 HEENT: Atraumatic, Normocephalic. Neck supple. No masses,  No LAD. Ears and Nose: No external deformity. CV: RRR, No M/G/R. No JVD. No thrill. No extra heart sounds. PULM: CTA B, no wheezes, crackles, rhonchi. No retractions. No resp. distress. No accessory muscle use. ABD: S, NT, ND, +BS. No rebound. No HSM. EXTR: No c/c/e NEURO Normal gait.  PSYCH: Normally interactive. Conversant. Not depressed or anxious appearing.  Calm demeanor.  SKIn:  Band of erythematous, swollen area.    Assessment and Plan: Contact dermatitis ?latex Depo medrol TAC  Signed,  Ellison Carwin, MD

## 2014-01-04 ENCOUNTER — Telehealth: Payer: Self-pay

## 2014-01-04 NOTE — Telephone Encounter (Signed)
Give it a week!

## 2014-01-04 NOTE — Telephone Encounter (Signed)
Patient was seen by Dr. Ouida Sills for rash.  She has been using the cream that was prescribed and taking the benadryl, but the itching and redness is worsening.  Please advise.

## 2014-01-04 NOTE — Telephone Encounter (Signed)
Patient called stated she seen Dr. Ouida Sills on 01/03/14. Patient stated she have rash on back and abdomen area. Rash is still itching and irritating patient. Please call patient at work [phone] ext:[phone]

## 2014-01-06 NOTE — Telephone Encounter (Signed)
Spoke to patient.  Advised it can take up to one week for sx to subside. Patient stated that someone had called her yesterday.  No phone message was documented.

## 2014-05-05 ENCOUNTER — Other Ambulatory Visit: Payer: Self-pay | Admitting: Family Medicine

## 2014-08-26 ENCOUNTER — Other Ambulatory Visit: Payer: Self-pay | Admitting: Family Medicine

## 2014-09-22 ENCOUNTER — Telehealth: Payer: Self-pay

## 2014-09-22 NOTE — Telephone Encounter (Signed)
Patient is requesting a refill on "Exforge". Patient is scheduled for her CPE with Dr Brigitte Pulse on 11/10/14. She is requesting it to be sent to [facility] in [place] and her call back number is [phone]

## 2014-09-23 MED ORDER — AMLODIPINE BESYLATE-VALSARTAN 10-320 MG PO TABS: 1.0000 | ORAL_TABLET | Freq: Every day | ORAL | Status: DC

## 2014-09-23 NOTE — Telephone Encounter (Signed)
Done and pt notified.

## 2014-11-10 ENCOUNTER — Ambulatory Visit (INDEPENDENT_AMBULATORY_CARE_PROVIDER_SITE_OTHER): Payer: Managed Care, Other (non HMO) | Admitting: Family Medicine

## 2014-11-10 ENCOUNTER — Encounter: Payer: Self-pay | Admitting: Family Medicine

## 2014-11-10 VITALS — BP 131/85 | HR 73 | Temp 98.5°F | Resp 16 | Ht 62.5 in | Wt 184.4 lb

## 2014-11-10 DIAGNOSIS — E559 Vitamin D deficiency, unspecified: Secondary | ICD-10-CM

## 2014-11-10 DIAGNOSIS — F4321 Adjustment disorder with depressed mood: Secondary | ICD-10-CM

## 2014-11-10 DIAGNOSIS — Z0000 Encounter for general adult medical examination without abnormal findings: Secondary | ICD-10-CM

## 2014-11-10 DIAGNOSIS — I10 Essential (primary) hypertension: Secondary | ICD-10-CM

## 2014-11-10 DIAGNOSIS — Z0189 Encounter for other specified special examinations: Secondary | ICD-10-CM

## 2014-11-10 DIAGNOSIS — Z5181 Encounter for therapeutic drug level monitoring: Secondary | ICD-10-CM

## 2014-11-10 DIAGNOSIS — R5383 Other fatigue: Secondary | ICD-10-CM

## 2014-11-10 DIAGNOSIS — F4320 Adjustment disorder, unspecified: Secondary | ICD-10-CM

## 2014-11-10 DIAGNOSIS — Z1239 Encounter for other screening for malignant neoplasm of breast: Secondary | ICD-10-CM

## 2014-11-10 LAB — CBC WITH DIFFERENTIAL/PLATELET
BASOS PCT: 0 % (ref 0–1)
Basophils Absolute: 0 10*3/uL (ref 0.0–0.1)
EOS ABS: 0.1 10*3/uL (ref 0.0–0.7)
EOS PCT: 1 % (ref 0–5)
HEMATOCRIT: 35.7 % — AB (ref 36.0–46.0)
HEMOGLOBIN: 11.6 g/dL — AB (ref 12.0–15.0)
Lymphocytes Relative: 47 % — ABNORMAL HIGH (ref 12–46)
Lymphs Abs: 3.2 10*3/uL (ref 0.7–4.0)
MCH: 29.7 pg (ref 26.0–34.0)
MCHC: 32.5 g/dL (ref 30.0–36.0)
MCV: 91.5 fL (ref 78.0–100.0)
MONO ABS: 0.5 10*3/uL (ref 0.1–1.0)
MONOS PCT: 7 % (ref 3–12)
MPV: 10.2 fL (ref 8.6–12.4)
NEUTROS PCT: 45 % (ref 43–77)
Neutro Abs: 3.1 10*3/uL (ref 1.7–7.7)
Platelets: 331 10*3/uL (ref 150–400)
RBC: 3.9 MIL/uL (ref 3.87–5.11)
RDW: 12.7 % (ref 11.5–15.5)
WBC: 6.9 10*3/uL (ref 4.0–10.5)

## 2014-11-10 LAB — COMPREHENSIVE METABOLIC PANEL
ALK PHOS: 44 U/L (ref 39–117)
ALT: 15 U/L (ref 0–35)
AST: 16 U/L (ref 0–37)
Albumin: 4.3 g/dL (ref 3.5–5.2)
BILIRUBIN TOTAL: 0.3 mg/dL (ref 0.2–1.2)
BUN: 9 mg/dL (ref 6–23)
CHLORIDE: 105 meq/L (ref 96–112)
CO2: 22 mEq/L (ref 19–32)
Calcium: 9.1 mg/dL (ref 8.4–10.5)
Creat: 0.99 mg/dL (ref 0.50–1.10)
GLUCOSE: 95 mg/dL (ref 70–99)
Potassium: 4.2 mEq/L (ref 3.5–5.3)
Sodium: 137 mEq/L (ref 135–145)
Total Protein: 7.4 g/dL (ref 6.0–8.3)

## 2014-11-10 LAB — LDL CHOLESTEROL, DIRECT: Direct LDL: 94 mg/dL

## 2014-11-10 LAB — HIV ANTIBODY (ROUTINE TESTING W REFLEX): HIV 1&2 Ab, 4th Generation: NONREACTIVE

## 2014-11-10 LAB — TSH: TSH: 2.475 u[IU]/mL (ref 0.350–4.500)

## 2014-11-10 MED ORDER — AMLODIPINE BESYLATE-VALSARTAN 10-320 MG PO TABS: 1.0000 | ORAL_TABLET | Freq: Every day | ORAL | Status: DC

## 2014-11-10 NOTE — Patient Instructions (Signed)
Stress and Stress Management Stress is a normal reaction to life events. It is what you feel when life demands more than you are used to or more than you can handle. Some stress can be useful. For example, the stress reaction can help you catch the last bus of the day, study for a test, or meet a deadline at work. But stress that occurs too often or for too long can cause problems. It can affect your emotional health and interfere with relationships and normal daily activities. Too much stress can weaken your immune system and increase your risk for physical illness. If you already have a medical problem, stress can make it worse. CAUSES  All sorts of life events may cause stress. An event that causes stress for one person may not be stressful for another person. Major life events commonly cause stress. These may be positive or negative. Examples include losing your job, moving into a new home, getting married, having a baby, or losing a loved one. Less obvious life events may also cause stress, especially if they occur day after day or in combination. Examples include working long hours, driving in traffic, caring for children, being in debt, or being in a difficult relationship. SIGNS AND SYMPTOMS Stress may cause emotional symptoms including, the following:  Anxiety. This is feeling worried, afraid, on edge, overwhelmed, or out of control.  Anger. This is feeling irritated or impatient.  Depression. This is feeling sad, down, helpless, or guilty.  Difficulty focusing, remembering, or making decisions. Stress may cause physical symptoms, including the following:   Aches and pains. These may affect your head, neck, back, stomach, or other areas of your body.  Tight muscles or clenched jaw.  Low energy or trouble sleeping. Stress may cause unhealthy behaviors, including the following:   Eating to feel better (overeating) or skipping meals.  Sleeping too little, too much, or both.  Working  too much or putting off tasks (procrastination).  Smoking, drinking alcohol, or using drugs to feel better. DIAGNOSIS  Stress is diagnosed through an assessment by your health care provider. Your health care provider will ask questions about your symptoms and any stressful life events.Your health care provider will also ask about your medical history and may order blood tests or other tests. Certain medical conditions and medicine can cause physical symptoms similar to stress. Mental illness can cause emotional symptoms and unhealthy behaviors similar to stress. Your health care provider may refer you to a mental health professional for further evaluation.  TREATMENT  Stress management is the recommended treatment for stress.The goals of stress management are reducing stressful life events and coping with stress in healthy ways.  Techniques for reducing stressful life events include the following:  Stress identification. Self-monitor for stress and identify what causes stress for you. These skills may help you to avoid some stressful events.  Time management. Set your priorities, keep a calendar of events, and learn to say "no." These tools can help you avoid making too many commitments. Techniques for coping with stress include the following:  Rethinking the problem. Try to think realistically about stressful events rather than ignoring them or overreacting. Try to find the positives in a stressful situation rather than focusing on the negatives.  Exercise. Physical exercise can release both physical and emotional tension. The key is to find a form of exercise you enjoy and do it regularly.  Relaxation techniques. These relax the body and mind. Examples include yoga, meditation, tai chi, biofeedback, deep  breathing, progressive muscle relaxation, listening to music, being out in nature, journaling, and other hobbies. Again, the key is to find one or more that you enjoy and can do  regularly.  Healthy lifestyle. Eat a balanced diet, get plenty of sleep, and do not smoke. Avoid using alcohol or drugs to relax.  Strong support network. Spend time with family, friends, or other people you enjoy being around.Express your feelings and talk things over with someone you trust. Counseling or talktherapy with a mental health professional may be helpful if you are having difficulty managing stress on your own. Medicine is typically not recommended for the treatment of stress.Talk to your health care provider if you think you need medicine for symptoms of stress. HOME CARE INSTRUCTIONS  Keep all follow-up visits as directed by your health care provider.  Take all medicines as directed by your health care provider. SEEK MEDICAL CARE IF:  Your symptoms get worse or you start having new symptoms.  You feel overwhelmed by your problems and can no longer manage them on your own. SEEK IMMEDIATE MEDICAL CARE IF:  You feel like hurting yourself or someone else. Document Released: 02/25/2001 Document Revised: 01/16/2014 Document Reviewed: 04/26/2013 [facility] Patient Information 2015 [place], [place]. This information is not intended to replace advice given to you by your health care provider. Make sure you discuss any questions you have with your health care provider. Adjustment Disorder Most changes in life can cause stress. Getting used to changes may take a few months or longer. If feelings of stress, hopelessness, or worry continue, you may have an adjustment disorder. This stress-related mental health problem may affect your feelings, thinking and how you act. It occurs in both sexes and happens at any age. SYMPTOMS  Some of the following problems may be seen and vary from person to person:  Sadness or depression.  Loss of enjoyment.  Thoughts of suicide.  Fighting.  Avoiding family and friends.  Poor school performance.  Hopelessness, sense of loss.  Trouble  sleeping.  Vandalism.  Worry, weight loss or gain.  Crying spells.  Anxiety  Reckless driving.  Skipping school.  Poor work Systems analyst.  Nervousness.  Ignoring bills.  Poor attitude. DIAGNOSIS  Your caregiver will ask what has happened in your life and do a physical exam. They will make a diagnosis of an adjustment disorder when they are sure another problem or medical illness causing your feelings does not exist. TREATMENT  When problems caused by stress interfere with you daily life or last longer than a few months, you may need counseling for an adjustment disorder. Early treatment may diminish problems and help you to better cope with the stressful events in your life. Sometimes medication is necessary. Individual counseling and or support groups can be very helpful. PROGNOSIS  Adjustment disorders usually last less than 3 to 6 months. The condition may persist if there is long lasting stress. This could include health problems, relationship problems, or job difficulties where you can not easily escape from what is causing the problem. PREVENTION  Even the most mentally healthy, highly functioning people can suffer from an adjustment disorder given a significant blow from a life-changing event. There is no way to prevent pain and loss. Most people need help from time to time. You are not alone. SEEK MEDICAL CARE IF:  Your feelings or symptoms listed above do not improve or worsen. Document Released: 05/06/2006 Document Revised: 11/24/2011 Document Reviewed: 07/28/2007 [facility] Patient Information 2015 [place], [place]. This information  is not intended to replace advice given to you by your health care provider. Make sure you discuss any questions you have with your health care provider. Managing Your High Blood Pressure Blood pressure is a measurement of how forceful your blood is pressing against the walls of the arteries. Arteries are muscular tubes within the circulatory  system. Blood pressure does not stay the same. Blood pressure rises when you are active, excited, or nervous; and it lowers during sleep and relaxation. If the numbers measuring your blood pressure stay above normal most of the time, you are at risk for health problems. High blood pressure (hypertension) is a long-term (chronic) condition in which blood pressure is elevated. A blood pressure reading is recorded as two numbers, such as 120 over 80 (or 120/80). The first, higher number is called the systolic pressure. It is a measure of the pressure in your arteries as the heart beats. The second, lower number is called the diastolic pressure. It is a measure of the pressure in your arteries as the heart relaxes between beats.  Keeping your blood pressure in a normal range is important to your overall health and prevention of health problems, such as heart disease and stroke. When your blood pressure is uncontrolled, your heart has to work harder than normal. High blood pressure is a very common condition in adults because blood pressure tends to rise with age. Men and women are equally likely to have hypertension but at different times in life. Before age 56, men are more likely to have hypertension. After 40 years of age, women are more likely to have it. Hypertension is especially common in African Americans. This condition often has no signs or symptoms. The cause of the condition is usually not known. Your caregiver can help you come up with a plan to keep your blood pressure in a normal, healthy range. BLOOD PRESSURE STAGES Blood pressure is classified into four stages: normal, prehypertension, stage 1, and stage 2. Your blood pressure reading will be used to determine what type of treatment, if any, is necessary. Appropriate treatment options are tied to these four stages:  Normal  Systolic pressure (mm Hg): below 120.  Diastolic pressure (mm Hg): below 80. Prehypertension  Systolic pressure (mm  Hg): 120 to 139.  Diastolic pressure (mm Hg): 80 to 89. Stage1  Systolic pressure (mm Hg): 140 to 159.  Diastolic pressure (mm Hg): 90 to 99. Stage2  Systolic pressure (mm Hg): 160 or above.  Diastolic pressure (mm Hg): 100 or above. RISKS RELATED TO HIGH BLOOD PRESSURE Managing your blood pressure is an important responsibility. Uncontrolled high blood pressure can lead to:  A heart attack.  A stroke.  A weakened blood vessel (aneurysm).  Heart failure.  Kidney damage.  Eye damage.  Metabolic syndrome.  Memory and concentration problems. HOW TO MANAGE YOUR BLOOD PRESSURE Blood pressure can be managed effectively with lifestyle changes and medicines (if needed). Your caregiver will help you come up with a plan to bring your blood pressure within a normal range. Your plan should include the following: Education  Read all information provided by your caregivers about how to control blood pressure.  Educate yourself on the latest guidelines and treatment recommendations. New research is always being done to further define the risks and treatments for high blood pressure. Lifestylechanges  Control your weight.  Avoid smoking.  Stay physically active.  Reduce the amount of salt in your diet.  Reduce stress.  Control any chronic conditions, such  as high cholesterol or diabetes.  Reduce your alcohol intake. Medicines  Several medicines (antihypertensive medicines) are available, if needed, to bring blood pressure within a normal range. Communication  Review all the medicines you take with your caregiver because there may be side effects or interactions.  Talk with your caregiver about your diet, exercise habits, and other lifestyle factors that may be contributing to high blood pressure.  See your caregiver regularly. Your caregiver can help you create and adjust your plan for managing high blood pressure. RECOMMENDATIONS FOR TREATMENT AND FOLLOW-UP  The  following recommendations are based on current guidelines for managing high blood pressure in nonpregnant adults. Use these recommendations to identify the proper follow-up period or treatment option based on your blood pressure reading. You can discuss these options with your caregiver.  Systolic pressure of 536 to 468 or diastolic pressure of 80 to 89: Follow up with your caregiver as directed.  Systolic pressure of 032 to 122 or diastolic pressure of 90 to 100: Follow up with your caregiver within 2 months.  Systolic pressure above 482 or diastolic pressure above 500: Follow up with your caregiver within 1 month.  Systolic pressure above 370 or diastolic pressure above 488: Consider antihypertensive therapy; follow up with your caregiver within 1 week.  Systolic pressure above 891 or diastolic pressure above 694: Begin antihypertensive therapy; follow up with your caregiver within 1 week. Document Released: 05/26/2012 Document Reviewed: 05/26/2012 [facility] Patient Information 2015 [place]. This information is not intended to replace advice given to you by your health care provider. Make sure you discuss any questions you have with your health care provider.

## 2014-11-10 NOTE — Progress Notes (Deleted)
   Subjective:    Patient ID: Pamela Allen, female    DOB: [birth year], 39 y.o.   MRN: [id number]  HPI    Review of Systems  Constitutional: Negative.   HENT: Negative.   Eyes: Negative.   Respiratory: Negative.   Cardiovascular: Negative.   Gastrointestinal: Negative.   Endocrine: Negative.   Genitourinary: Negative.   Musculoskeletal: Negative.   Skin: Negative.   Allergic/Immunologic: Negative.   Neurological: Negative.   Hematological: Negative.   Psychiatric/Behavioral: Negative.        Objective:   Physical Exam        Assessment & Plan:

## 2014-11-10 NOTE — Progress Notes (Signed)
Subjective:  This chart was scribed for Pamela Knapp, MD by Tamsen Roers, at [facility].  This patient was seen in room 26 and the patient's care was started at 11:00 AM.   Chief Complaint  Patient presents with  . Annual Exam    with pap     Patient ID: Pamela Allen, female    DOB: March 01, 1976, 39 y.o.   MRN: [id number]  HPI  HPI Comments: Pamela Allen is a 39 y.o. female who presents to [facility] for blood pressure medication.  She is not having any issues with her blood pressure medication, checks it at home ( usually 128/82).  She has been on Amlodipine valsartan for years and states that it works well for her.  Patients last pap smear 2 years ago without any abnormalities. Patient goes to the eye doctor once a year (last in December).  She has already eaten today.  She has not had a mammogram yet and she is up to date with her shots.  Patient does not Vitamin D or Calcium supplements, nor does she drink milk. Patients husband is in the hospital (ICU) and has stage 4 Pancreatic cancer and states she is not getting much sleep lately.  She is not currently working and has children (11 and 17).  Patient has no other complaints or concerns today.    Past Medical History  Diagnosis Date  . Hypertension     Current Outpatient Prescriptions on File Prior to Visit  Medication Sig Dispense Refill  . amLODipine-valsartan (EXFORGE) 10-320 MG per tablet Take 1 tablet by mouth daily. NO MORE REFILLS WITHOUT OFFICE VISIT - 2ND NOTICE 30 tablet 1  . naproxen (NAPROSYN) 500 MG tablet Take 1 tablet (500 mg total) by mouth 2 (two) times daily. 30 tablet 0  . triamcinolone cream (KENALOG) 0.1 % Apply 1 application topically 2 (two) times daily. 60 g 1   No current facility-administered medications on file prior to visit.    No Known Allergies  Past Surgical History  Procedure Laterality Date  . Tubal ligation     Family History  Problem  Relation Age of Onset  . Diabetes Mother   . Heart disease Mother   . Hypertension Mother   . Stroke Mother   . Cancer Father   . Hypertension Father   . Stroke Sister    History   Social History  . Marital Status: Single    Spouse Name: N/A  . Number of Children: N/A  . Years of Education: N/A   Social History Main Topics  . Smoking status: Never Smoker   . Smokeless tobacco: Not on file  . Alcohol Use: No  . Drug Use: Not on file  . Sexual Activity: Not on file   Other Topics Concern  . None   Social History Narrative   Married   Cytogeneticist / CNA    Review of Systems  All other systems reviewed and are negative.  BP 131/85 mmHg  Pulse 73  Temp(Src) 98.5 F (36.9 C) (Oral)  Resp 16  Ht 5' 2.5" (1.588 m)  Wt 184 lb 6.4 oz (83.643 kg)  BMI 33.17 kg/m2  SpO2 99%  LMP 10/23/2014     Objective:   Physical Exam  Constitutional: She is oriented to person, place, and time. She appears well-developed and well-nourished. No distress.  HENT:  Head: Normocephalic and atraumatic.  Eyes: Conjunctivae and  EOM are normal.  Neck: Neck supple.  Cardiovascular: Normal rate, regular rhythm, S1 normal, S2 normal and normal heart sounds.   No murmur heard. Pulmonary/Chest: Effort normal and breath sounds normal. No respiratory distress. She has no wheezes. She has no rales.  Musculoskeletal: Normal range of motion.  Neurological: She is alert and oriented to person, place, and time.  Skin: Skin is warm and dry.  Psychiatric: She has a normal mood and affect. Her behavior is normal.  Nursing note and vitals reviewed.   Results for orders placed or performed in visit on 04/30/13  Comprehensive metabolic panel  Result Value Ref Range   Sodium 137 135 - 145 mEq/L   Potassium 4.1 3.5 - 5.3 mEq/L   Chloride 103 96 - 112 mEq/L   CO2 27 19 - 32 mEq/L   Glucose, Bld 94 70 - 99 mg/dL   BUN 11 6 - 23 mg/dL   Creat 1.16 (H) 0.50 - 1.10 mg/dL   Total  Bilirubin 0.4 0.3 - 1.2 mg/dL   Alkaline Phosphatase 45 39 - 117 U/L   AST 16 0 - 37 U/L   ALT 16 0 - 35 U/L   Total Protein 7.4 6.0 - 8.3 g/dL   Albumin 4.4 3.5 - 5.2 g/dL   Calcium 9.3 8.4 - 10.5 mg/dL  TSH  Result Value Ref Range   TSH 1.400 0.350 - 4.500 uIU/mL         Assessment & Plan:   Essential hypertension, benign - Plan: LDL Cholesterol, Direct  Wellness examination - Plan: CBC with Differential/Platelet, Comprehensive metabolic panel, TSH, Vit D  25 hydroxy (rtn osteoporosis monitoring), LDL Cholesterol, Direct, HIV antibody (with reflex) - nml pap 2 yrs prior w/ no h/o abnml so repeat in 1 yr - not currently sexually active due to husbands illness.  Medication monitoring encounter - Plan: Comprehensive metabolic panel  Screening for breast cancer - Plan: MM Digital Diagnostic Bilat  Other fatigue - Plan: CBC with Differential/Platelet, TSH, Vit D  25 hydroxy (rtn osteoporosis monitoring), HIV antibody (with reflex)  Vitamin D deficiency - start high dose replacement x 6 mos  Anticipatory grieving - husband in [place] ICU for end-stage pancreatic cancer - pt taking him home with hospice tomorrow - has 11 yo and 97 yo sons so pt advised that if she has any needs - inc poor sleep or anxiety - just to call and will try to avoid her needing to come in for OV if possible  Meds ordered this encounter  Medications  . amLODipine-valsartan (EXFORGE) 10-320 MG per tablet    Sig: Take 1 tablet by mouth daily.    Dispense:  90 tablet    Refill:  3    I personally performed the services described in this documentation, which was scribed in my presence. The recorded information has been reviewed and considered, and addended by me as needed.  Delman Cheadle, MD MPH

## 2014-11-11 ENCOUNTER — Encounter: Payer: Self-pay | Admitting: Family Medicine

## 2014-11-11 DIAGNOSIS — E559 Vitamin D deficiency, unspecified: Secondary | ICD-10-CM | POA: Insufficient documentation

## 2014-11-11 LAB — VITAMIN D 25 HYDROXY (VIT D DEFICIENCY, FRACTURES): Vit D, 25-Hydroxy: 7 ng/mL — ABNORMAL LOW (ref 30–100)

## 2014-11-11 MED ORDER — ERGOCALCIFEROL 1.25 MG (50000 UT) PO CAPS: 50000.0000 [IU] | ORAL_CAPSULE | ORAL | Status: DC

## 2014-11-14 ENCOUNTER — Other Ambulatory Visit: Payer: Self-pay | Admitting: Family Medicine

## 2014-11-14 DIAGNOSIS — Z1231 Encounter for screening mammogram for malignant neoplasm of breast: Secondary | ICD-10-CM

## 2014-11-17 ENCOUNTER — Other Ambulatory Visit: Payer: Self-pay | Admitting: Family Medicine

## 2014-12-07 ENCOUNTER — Telehealth: Payer: Self-pay

## 2014-12-07 NOTE — Telephone Encounter (Signed)
Dr Brigitte Pulse  Patient recently lost her husband and cannot sleep.  Can you recommend something she can take.   [place] [address]  [phone] (H)

## 2014-12-08 MED ORDER — CLONAZEPAM 1 MG PO TABS: 1.0000 mg | ORAL_TABLET | Freq: Every day | ORAL | Status: DC

## 2014-12-08 NOTE — Telephone Encounter (Signed)
Called pt - she is having trouble sleeping at night and then when she wakes up has trouble getting motivated and going - spends a lot of time just thinking of her husband - wonders if she could be depressed. No h/o any prior mood d/o - has never tried any antidepressed/antianxiety/or sleep medicine.  Pt advised that she is grieving which a lot of times feels exactly like depression but hopefully as the next several months pass she will notice her mood starting to normalize - if not would want to consider medication or counseling. In the meantime, try clonazepam qhs w/ 1/2 tab qam prn - pt counseled to call if ineffective - may make fatigue/sedation worse but hopefully will enable her to get the necessary things done during this grieving periods as well as sleep some.

## 2014-12-13 ENCOUNTER — Ambulatory Visit
Admission: RE | Admit: 2014-12-13 | Discharge: 2014-12-13 | Disposition: A | Discharge status: Home / Self Care | Payer: Managed Care, Other (non HMO) | Source: Ambulatory Visit | Attending: Family Medicine | Admitting: Family Medicine

## 2014-12-13 DIAGNOSIS — Z1231 Encounter for screening mammogram for malignant neoplasm of breast: Secondary | ICD-10-CM

## 2014-12-18 ENCOUNTER — Other Ambulatory Visit: Payer: Self-pay | Admitting: Family Medicine

## 2014-12-18 DIAGNOSIS — R928 Other abnormal and inconclusive findings on diagnostic imaging of breast: Secondary | ICD-10-CM

## 2014-12-20 ENCOUNTER — Ambulatory Visit
Admission: RE | Admit: 2014-12-20 | Discharge: 2014-12-20 | Disposition: A | Discharge status: Home / Self Care | Payer: Managed Care, Other (non HMO) | Source: Ambulatory Visit | Attending: Family Medicine | Admitting: Family Medicine

## 2014-12-20 DIAGNOSIS — R928 Other abnormal and inconclusive findings on diagnostic imaging of breast: Secondary | ICD-10-CM

## 2014-12-21 ENCOUNTER — Encounter: Payer: Self-pay | Admitting: Family Medicine

## 2014-12-21 DIAGNOSIS — D242 Benign neoplasm of left breast: Secondary | ICD-10-CM | POA: Insufficient documentation

## 2015-05-06 ENCOUNTER — Other Ambulatory Visit: Payer: Self-pay | Admitting: Family Medicine

## 2015-05-22 ENCOUNTER — Other Ambulatory Visit: Payer: Self-pay | Admitting: Family Medicine

## 2015-05-22 DIAGNOSIS — N6320 Unspecified lump in the left breast, unspecified quadrant: Secondary | ICD-10-CM

## 2015-06-11 ENCOUNTER — Ambulatory Visit (INDEPENDENT_AMBULATORY_CARE_PROVIDER_SITE_OTHER): Payer: Managed Care, Other (non HMO) | Admitting: Emergency Medicine

## 2015-06-11 VITALS — BP 110/72 | HR 86 | Temp 98.1°F | Resp 16 | Ht 62.0 in | Wt 184.2 lb

## 2015-06-11 DIAGNOSIS — M26629 Arthralgia of temporomandibular joint, unspecified side: Secondary | ICD-10-CM

## 2015-06-11 DIAGNOSIS — M2662 Arthralgia of temporomandibular joint: Secondary | ICD-10-CM

## 2015-06-11 MED ORDER — DIAZEPAM 2 MG PO TABS: 2.0000 mg | ORAL_TABLET | Freq: Three times a day (TID) | ORAL | Status: DC

## 2015-06-11 MED ORDER — NAPROXEN SODIUM 550 MG PO TABS: 550.0000 mg | ORAL_TABLET | Freq: Two times a day (BID) | ORAL | Status: DC

## 2015-06-11 NOTE — Patient Instructions (Signed)
Temporomandibular Problems  Temporomandibular joint (TMJ) dysfunction means there are problems with the joint between your jaw and your skull. This is a joint lined by cartilage like other joints in your body but also has a small disc in the joint which keeps the bones from rubbing on each other. These joints are like other joints and can get inflamed (sore) from arthritis and other problems. When this joint gets sore, it can cause headaches and pain in the jaw and the face. CAUSES  Usually the arthritic types of problems are caused by soreness in the joint. Soreness in the joint can also be caused by overuse. This may come from grinding your teeth. It may also come from mis-alignment in the joint. DIAGNOSIS Diagnosis of this condition can often be made by history and exam. Sometimes your caregiver may need X-rays or an MRI scan to determine the exact cause. It may be necessary to see your dentist to determine if your teeth and jaws are lined up correctly. TREATMENT  Most of the time this problem is not serious; however, sometimes it can persist (become chronic). When this happens medications that will cut down on inflammation (soreness) help. Sometimes a shot of cortisone into the joint will be helpful. If your teeth are not aligned it may help for your dentist to make a splint for your mouth that can help this problem. If no physical problems can be found, the problem may come from tension. If tension is found to be the cause, biofeedback or relaxation techniques may be helpful. HOME CARE INSTRUCTIONS   Later in the day, applications of ice packs may be helpful. Ice can be used in a plastic bag with a towel around it to prevent frostbite to skin. This may be used about every 2 hours for 20 to 30 minutes, as needed while awake, or as directed by your caregiver.  Only take over-the-counter or prescription medicines for pain, discomfort, or fever as directed by your caregiver.  If physical therapy was  prescribed, follow your caregiver's directions.  Wear mouth appliances as directed if they were given. Document Released: 05/27/2001 Document Revised: 11/24/2011 Document Reviewed: 09/03/2008 [facility] Patient Information 2015 [place], [place]. This information is not intended to replace advice given to you by your health care provider. Make sure you discuss any questions you have with your health care provider.

## 2015-06-11 NOTE — Progress Notes (Signed)
Subjective:  Patient ID: Pamela Allen, female    DOB: 10/09/1975  Age: 39 y.o. MRN: [id number]  CC: Headache; nausea; and left ear   HPI Pamela Allen presents  pain in the left side of her jaw. She has a headache from that. This been bothering her for about 2 weeks. She's no fever chills nasal congestion postnasal drainage or sore throat. She has no thermal sensitivity in her teeth. No history of injury or overuse. She has pain is worse when she chews or bites  History Pamela Allen has a past medical history of Hypertension.   She has past surgical history that includes Tubal ligation.   Her  family history includes Cancer in her father; Diabetes in her mother; Heart disease in her mother; Hypertension in her father and mother; Stroke in her mother and sister.  She   reports that she has never smoked. She does not have any smokeless tobacco history on file. She reports that she does not drink alcohol. Her drug history is not on file.  Outpatient Prescriptions Prior to Visit  Medication Sig Dispense Refill  . amLODipine-valsartan (EXFORGE) 10-320 MG per tablet Take 1 tablet by mouth daily. 90 tablet 3  . clonazePAM (KLONOPIN) 1 MG tablet Take 1 tablet (1 mg total) by mouth at bedtime. For sleep.  Can take 1/2 tab qam as needed for anxiety 30 tablet 1  . Vitamin D, Ergocalciferol, (DRISDOL) 50000 UNITS CAPS capsule TAKE 1 CAPSULE BY MOUTH ONCE A WEEK 12 capsule 0  . naproxen (NAPROSYN) 500 MG tablet Take 1 tablet (500 mg total) by mouth 2 (two) times daily. (Patient not taking: Reported on 11/10/2014) 30 tablet 0  . triamcinolone cream (KENALOG) 0.1 % Apply 1 application topically 2 (two) times daily. (Patient not taking: Reported on 11/10/2014) 60 g 1   No facility-administered medications prior to visit.    Social History   Social History  . Marital Status: Single    Spouse Name: N/A  . Number of Children: N/A  . Years of Education: N/A   Social History Main Topics  .  Smoking status: Never Smoker   . Smokeless tobacco: None  . Alcohol Use: No  . Drug Use: None  . Sexual Activity: Not Asked   Other Topics Concern  . None   Social History Narrative   Married   Cytogeneticist / CNA     Review of Systems  Constitutional: Negative for fever, chills and appetite change.  HENT: Negative for congestion, ear pain, postnasal drip, sinus pressure and sore throat.   Eyes: Negative for pain and redness.  Respiratory: Negative for cough, shortness of breath and wheezing.   Cardiovascular: Negative for leg swelling.  Gastrointestinal: Negative for nausea, vomiting, abdominal pain, diarrhea, constipation and blood in stool.  Endocrine: Negative for polyuria.  Genitourinary: Negative for dysuria, urgency, frequency and flank pain.  Musculoskeletal: Negative for gait problem.  Skin: Negative for rash.  Neurological: Negative for weakness and headaches.  Psychiatric/Behavioral: Negative for confusion and decreased concentration. The patient is not nervous/anxious.     Objective:  BP 110/72 mmHg  Pulse 86  Temp(Src) 98.1 F (36.7 C) (Oral)  Resp 16  Ht 5' 2"  (1.575 m)  Wt 184 lb 3.2 oz (83.553 kg)  BMI 33.68 kg/m2  SpO2 97%  LMP 06/08/2015  Physical Exam  Constitutional: She is oriented to person, place, and time. She appears well-developed and well-nourished.  HENT:  Head: Normocephalic and atraumatic.  Mouth/Throat: No oral lesions. Normal dentition. No dental caries.  Tender left TMJ  Eyes: Conjunctivae are normal. Pupils are equal, round, and reactive to light.  Pulmonary/Chest: Effort normal.  Musculoskeletal: She exhibits no edema.  Neurological: She is alert and oriented to person, place, and time.  Skin: Skin is dry.  Psychiatric: She has a normal mood and affect. Her behavior is normal. Thought content normal.      Assessment & Plan:   Maleia was seen today for headache, nausea and left ear.  Diagnoses and all  orders for this visit:  TMJ arthralgia  Other orders -     diazepam (VALIUM) 2 MG tablet; Take 1 tablet (2 mg total) by mouth every 8 (eight) hours. -     naproxen sodium (ANAPROX DS) 550 MG tablet; Take 1 tablet (550 mg total) by mouth 2 (two) times daily with a meal.  I have discontinued Ms. Chavis's naproxen and triamcinolone cream. I am also having her start on diazepam and naproxen sodium. Additionally, I am having her maintain her amLODipine-valsartan, clonazePAM, and Vitamin D (Ergocalciferol).  Meds ordered this encounter  Medications  . diazepam (VALIUM) 2 MG tablet    Sig: Take 1 tablet (2 mg total) by mouth every 8 (eight) hours.    Dispense:  45 tablet    Refill:  0  . naproxen sodium (ANAPROX DS) 550 MG tablet    Sig: Take 1 tablet (550 mg total) by mouth 2 (two) times daily with a meal.    Dispense:  40 tablet    Refill:  0     Appropriate red flag conditions were discussed with the patient as well as actions that should be taken.  Patient expressed his understanding.  Follow-up: Return if symptoms worsen or fail to improve.  Roselee Culver, MD

## 2015-06-22 ENCOUNTER — Ambulatory Visit
Admission: RE | Admit: 2015-06-22 | Discharge: 2015-06-22 | Disposition: A | Discharge status: Home / Self Care | Payer: Managed Care, Other (non HMO) | Source: Ambulatory Visit | Attending: Family Medicine | Admitting: Family Medicine

## 2015-06-22 DIAGNOSIS — N6320 Unspecified lump in the left breast, unspecified quadrant: Secondary | ICD-10-CM

## 2015-07-29 ENCOUNTER — Other Ambulatory Visit: Payer: Self-pay | Admitting: Physician Assistant

## 2015-08-27 ENCOUNTER — Emergency Department (HOSPITAL_COMMUNITY)
Admission: EM | Admit: 2015-08-27 | Discharge: 2015-08-27 | Disposition: A | Discharge status: Home / Self Care | Payer: Managed Care, Other (non HMO) | Attending: Emergency Medicine | Admitting: Emergency Medicine

## 2015-08-27 ENCOUNTER — Emergency Department (HOSPITAL_COMMUNITY): Payer: Managed Care, Other (non HMO)

## 2015-08-27 ENCOUNTER — Encounter (HOSPITAL_COMMUNITY): Payer: Self-pay

## 2015-08-27 DIAGNOSIS — H9209 Otalgia, unspecified ear: Secondary | ICD-10-CM | POA: Diagnosis not present

## 2015-08-27 DIAGNOSIS — Z792 Long term (current) use of antibiotics: Secondary | ICD-10-CM | POA: Diagnosis not present

## 2015-08-27 DIAGNOSIS — R519 Headache, unspecified: Secondary | ICD-10-CM

## 2015-08-27 DIAGNOSIS — Z79899 Other long term (current) drug therapy: Secondary | ICD-10-CM | POA: Insufficient documentation

## 2015-08-27 DIAGNOSIS — I10 Essential (primary) hypertension: Secondary | ICD-10-CM | POA: Diagnosis not present

## 2015-08-27 DIAGNOSIS — R51 Headache: Secondary | ICD-10-CM | POA: Insufficient documentation

## 2015-08-27 MED ORDER — NAPROXEN 500 MG PO TABS
500.0000 mg | ORAL_TABLET | Freq: Once | ORAL | Status: AC
  Administered 2015-08-27: 500 mg via ORAL
  Filled 2015-08-27: qty 1

## 2015-08-27 MED ORDER — NAPROXEN 375 MG PO TABS: 375.0000 mg | ORAL_TABLET | Freq: Two times a day (BID) | ORAL | Status: DC

## 2015-08-27 NOTE — ED Notes (Signed)
Pt transported to CT ?

## 2015-08-27 NOTE — ED Notes (Addendum)
Pt returned from CT °

## 2015-08-27 NOTE — ED Notes (Signed)
Pt c/o R side head pain radiating into R ear x 1 day.  Pain score 5/10.  Denies n/v/d.  Denies blurred vision and light sensitivity.  Pt reports taking ibuprofen w/o relief.

## 2015-08-27 NOTE — ED Provider Notes (Signed)
CSN: [id number]     Arrival date & time 08/27/15  0940 History   First MD Initiated Contact with Patient 08/27/15 1059     Chief Complaint  Patient presents with  . Headache  . Otalgia     (Consider location/radiation/quality/duration/timing/severity/associated sxs/prior Treatment) HPI   Pamela Allen is a 39 y.o F with a pmhx of HTN, TMJ, who presents the emergency department today complaining of headache and otalgia. Patient states that she was watching TV last night when she felt a headache on the right side of her head that radiates down to her right jaw with otalgia. The pain is sharp in nature and intermittent. No associated triggers. No photophobia or phonophobia. Patient is taking ibuprofen at home with no relief. Patient does not have history of migraines. Patient states that she attempted to see her PCP today however she is out of the office until next week. Denies visual changes, paresthesias, numbness, vomiting, gait disturbance, dizziness, syncope, neck stiffness, fever. Patient is not on blood thinners. No trauma or injury.  Past Medical History  Diagnosis Date  . Hypertension    Past Surgical History  Procedure Laterality Date  . Tubal ligation     Family History  Problem Relation Age of Onset  . Diabetes Mother   . Heart disease Mother   . Hypertension Mother   . Stroke Mother   . Cancer Father   . Hypertension Father   . Stroke Sister    Social History  Substance Use Topics  . Smoking status: Never Smoker   . Smokeless tobacco: None  . Alcohol Use: No   OB History    No data available     Review of Systems  All other systems reviewed and are negative.     Allergies  Review of patient's allergies indicates no known allergies.  Home Medications   Prior to Admission medications   Medication Sig Start Date End Date Taking? Authorizing Provider  amLODipine-valsartan (EXFORGE) 10-320 MG per tablet Take 1 tablet by mouth daily. 11/10/14  Yes Shawnee Knapp, MD  amoxicillin (AMOXIL) 500 MG capsule Take 1 capsule by mouth every 8 (eight) hours. 08/15/15  Yes Historical Provider, MD  clonazePAM (KLONOPIN) 1 MG tablet Take 1 tablet (1 mg total) by mouth at bedtime. For sleep.  Can take 1/2 tab qam as needed for anxiety 12/08/14  Yes Shawnee Knapp, MD  diazepam (VALIUM) 2 MG tablet Take 1 tablet (2 mg total) by mouth every 8 (eight) hours. Patient taking differently: Take 2 mg by mouth every 8 (eight) hours as needed for anxiety.  06/11/15  Yes Roselee Culver, MD  HYDROcodone-acetaminophen (NORCO/VICODIN) 5-325 MG tablet Take 1 tablet by mouth every 4 (four) hours as needed for moderate pain.   Yes Historical Provider, MD  ibuprofen (ADVIL,MOTRIN) 800 MG tablet Take 800 mg by mouth every 8 (eight) hours as needed for moderate pain.   Yes Historical Provider, MD  naproxen sodium (ANAPROX DS) 550 MG tablet Take 1 tablet (550 mg total) by mouth 2 (two) times daily with a meal. 06/11/15 06/10/16 Yes Roselee Culver, MD  Vitamin D, Ergocalciferol, (DRISDOL) 50000 UNITS CAPS capsule TAKE 1 CAPSULE BY MOUTH 1 TIME A WEEK   "NO MORE REFILLS WITHOUT OFFICE VISIT" Patient taking differently: Take 50,000 Units by mouth every Wednesday.  07/30/15  Yes Chelle Jeffery, PA-C   BP 128/91 mmHg  Pulse 76  Temp(Src) 98 F (36.7 C) (Oral)  Resp 16  SpO2 99%  LMP 08/20/2015 Physical Exam  Constitutional: She is oriented to person, place, and time. She appears well-developed and well-nourished. No distress.  HENT:  Head: Normocephalic and atraumatic.  Mouth/Throat: Oropharynx is clear and moist. No oropharyngeal exudate.  Eyes: Conjunctivae and EOM are normal. Pupils are equal, round, and reactive to light. Right eye exhibits no discharge. Left eye exhibits no discharge. No scleral icterus.  Neck: Normal range of motion. Neck supple.  No meningismus.   Cardiovascular: Normal rate, regular rhythm, normal heart sounds and intact distal pulses.  Exam reveals no  gallop and no friction rub.   No murmur heard. Pulmonary/Chest: Effort normal and breath sounds normal. No respiratory distress. She has no wheezes. She has no rales. She exhibits no tenderness.  Abdominal: Soft. She exhibits no distension. There is no tenderness. There is no guarding.  Musculoskeletal: Normal range of motion. She exhibits no edema.  Lymphadenopathy:    She has no cervical adenopathy.  Neurological: She is alert and oriented to person, place, and time. No cranial nerve deficit. She exhibits normal muscle tone. Coordination normal.  Strength 5/5 throughout. No sensory deficits.  No gait abnormality. Normal finger to nose.   Skin: Skin is warm and dry. No rash noted. She is not diaphoretic. No erythema. No pallor.  Psychiatric: She has a normal mood and affect. Her behavior is normal.  Nursing note and vitals reviewed.   ED Course  Procedures (including critical care time) Labs Review Labs Reviewed - No data to display  Imaging Review Ct Head Wo Contrast  08/27/2015  CLINICAL DATA:  Atypical headache. Right headache radiating to right ear for 1 day EXAM: CT HEAD WITHOUT CONTRAST TECHNIQUE: Contiguous axial images were obtained from the base of the skull through the vertex without intravenous contrast. COMPARISON:  05/30/2009 FINDINGS: No acute intracranial abnormality. Specifically, no hemorrhage, hydrocephalus, mass lesion, acute infarction, or significant intracranial injury. No acute calvarial abnormality. Visualized paranasal sinuses and mastoids clear. Orbital soft tissues unremarkable. IMPRESSION: Unremarkable noncontrast head CT. Electronically Signed   By: Rolm Baptise M.D.   On: 08/27/2015 12:49   I have personally reviewed and evaluated these images and lab results as part of my medical decision-making.   EKG Interpretation None      MDM   Final diagnoses:  Acute nonintractable headache, unspecified headache type   Given atypical presentation of HA with  no history of same, concern for intracranial abnormality. Discussed pt with Dr. Ralene Bathe. Will CT head.   Pt HA treated and improved while in ED.  CT head negative for SAH, hydrocephalus, mass lesion, or acute infarction. Presentation non concerning for  ICH, Meningitis, or temporal arteritis. Pt is afebrile with no focal neuro deficits, nuchal rigidity, or change in vision. Pt is to follow up with PCP to discuss prophylactic medication. Pt verbalizes understanding and is agreeable with plan to dc.   Patient was discussed with and seen by Dr. Ralene Bathe who agrees with the treatment plan.       Dondra Spry [place], PA-C 08/27/15 [place], MD 08/28/15 [phone]

## 2015-08-27 NOTE — Discharge Instructions (Signed)
General Headache Without Cause A headache is pain or discomfort felt around the head or neck area. There are many causes and types of headaches. In some cases, the cause may not be found.  HOME CARE  Managing Pain  Take over-the-counter and prescription medicines only as told by your doctor.  Lie down in a dark, quiet room when you have a headache.  If directed, apply ice to the head and neck area:  Put ice in a plastic bag.  Place a towel between your skin and the bag.  Leave the ice on for 20 minutes, 2-3 times per day.  Use a heating pad or hot shower to apply heat to the head and neck area as told by your doctor.  Keep lights dim if bright lights bother you or make your headaches worse. Eating and Drinking  Eat meals on a regular schedule.  Lessen how much alcohol you drink.  Lessen how much caffeine you drink, or stop drinking caffeine. General Instructions  Keep all follow-up visits as told by your doctor. This is important.  Keep a journal to find out if certain things bring on headaches. For example, write down:  What you eat and drink.  How much sleep you get.  Any change to your diet or medicines.  Relax by getting a massage or doing other relaxing activities.  Lessen stress.  Sit up straight. Do not tighten (tense) your muscles.  Do not use tobacco products. This includes cigarettes, chewing tobacco, or e-cigarettes. If you need help quitting, ask your doctor.  Exercise regularly as told by your doctor.  Get enough sleep. This often means 7-9 hours of sleep. GET HELP IF:  Your symptoms are not helped by medicine.  You have a headache that feels different than the other headaches.  You feel sick to your stomach (nauseous) or you throw up (vomit).  You have a fever. GET HELP RIGHT AWAY IF:   Your headache becomes really bad.  You keep throwing up.  You have a stiff neck.  You have trouble seeing.  You have trouble speaking.  You have  pain in the eye or ear.  Your muscles are weak or you lose muscle control.  You lose your balance or have trouble walking.  You feel like you will pass out (faint) or you pass out.  You have confusion.   This information is not intended to replace advice given to you by your health care provider. Make sure you discuss any questions you have with your health care provider.   Document Released: 06/10/2008 Document Revised: 05/23/2015 Document Reviewed: 12/25/2014 Elsevier Interactive Patient Education 2016 [employer].  Migraine Headache A migraine headache is an intense, throbbing pain on one or both sides of your head. A migraine can last for 30 minutes to several hours. CAUSES  The exact cause of a migraine headache is not always known. However, a migraine may be caused when nerves in the brain become irritated and release chemicals that cause inflammation. This causes pain. Certain things may also trigger migraines, such as:  Alcohol.  Smoking.  Stress.  Menstruation.  Aged cheeses.  Foods or drinks that contain nitrates, glutamate, aspartame, or tyramine.  Lack of sleep.  Chocolate.  Caffeine.  Hunger.  Physical exertion.  Fatigue.  Medicines used to treat chest pain (nitroglycerine), birth control pills, estrogen, and some blood pressure medicines. SIGNS AND SYMPTOMS  Pain on one or both sides of your head.  Pulsating or throbbing pain.  Severe  pain that prevents daily activities.  Pain that is aggravated by any physical activity.  Nausea, vomiting, or both.  Dizziness.  Pain with exposure to bright lights, loud noises, or activity.  General sensitivity to bright lights, loud noises, or smells. Before you get a migraine, you may get warning signs that a migraine is coming (aura). An aura may include:  Seeing flashing lights.  Seeing bright spots, halos, or zigzag lines.  Having tunnel vision or blurred vision.  Having feelings of numbness  or tingling.  Having trouble talking.  Having muscle weakness. DIAGNOSIS  A migraine headache is often diagnosed based on:  Symptoms.  Physical exam.  A CT scan or MRI of your head. These imaging tests cannot diagnose migraines, but they can help rule out other causes of headaches. TREATMENT Medicines may be given for pain and nausea. Medicines can also be given to help prevent recurrent migraines.  HOME CARE INSTRUCTIONS  Only take over-the-counter or prescription medicines for pain or discomfort as directed by your health care provider. The use of long-term narcotics is not recommended.  Lie down in a dark, quiet room when you have a migraine.  Keep a journal to find out what may trigger your migraine headaches. For example, write down:  What you eat and drink.  How much sleep you get.  Any change to your diet or medicines.  Limit alcohol consumption.  Quit smoking if you smoke.  Get 7-9 hours of sleep, or as recommended by your health care provider.  Limit stress.  Keep lights dim if bright lights bother you and make your migraines worse. SEEK IMMEDIATE MEDICAL CARE IF:   Your migraine becomes severe.  You have a fever.  You have a stiff neck.  You have vision loss.  You have muscular weakness or loss of muscle control.  You start losing your balance or have trouble walking.  You feel faint or pass out.  You have severe symptoms that are different from your first symptoms. MAKE SURE YOU:   Understand these instructions.  Will watch your condition.  Will get help right away if you are not doing well or get worse.   This information is not intended to replace advice given to you by your health care provider. Make sure you discuss any questions you have with your health care provider.   Follow-up with her primary care provider for reevaluation. Take naproxen as needed for pain. Return to the emergency department if you expands worsening or symptoms,  visual changes, numbness or tingling in extremity, severe increase in your pain, neck stiffness, fever, vomiting.

## 2015-09-16 HISTORY — PX: BREAST BIOPSY: SHX20

## 2015-09-16 HISTORY — PX: WISDOM TOOTH EXTRACTION: SHX21

## 2015-11-12 ENCOUNTER — Other Ambulatory Visit: Payer: Self-pay | Admitting: Family Medicine

## 2015-11-12 DIAGNOSIS — N6320 Unspecified lump in the left breast, unspecified quadrant: Secondary | ICD-10-CM

## 2015-11-15 ENCOUNTER — Encounter: Payer: Self-pay | Admitting: Family Medicine

## 2015-11-15 ENCOUNTER — Ambulatory Visit (INDEPENDENT_AMBULATORY_CARE_PROVIDER_SITE_OTHER): Payer: Managed Care, Other (non HMO) | Admitting: Family Medicine

## 2015-11-15 VITALS — BP 119/80 | HR 84 | Temp 98.0°F | Resp 16 | Ht 62.0 in | Wt 183.0 lb

## 2015-11-15 DIAGNOSIS — I10 Essential (primary) hypertension: Secondary | ICD-10-CM

## 2015-11-15 DIAGNOSIS — Z124 Encounter for screening for malignant neoplasm of cervix: Secondary | ICD-10-CM | POA: Diagnosis not present

## 2015-11-15 DIAGNOSIS — Z1239 Encounter for other screening for malignant neoplasm of breast: Secondary | ICD-10-CM | POA: Diagnosis not present

## 2015-11-15 DIAGNOSIS — Z1389 Encounter for screening for other disorder: Secondary | ICD-10-CM | POA: Diagnosis not present

## 2015-11-15 DIAGNOSIS — Z0000 Encounter for general adult medical examination without abnormal findings: Secondary | ICD-10-CM

## 2015-11-15 DIAGNOSIS — Z1329 Encounter for screening for other suspected endocrine disorder: Secondary | ICD-10-CM

## 2015-11-15 DIAGNOSIS — Z136 Encounter for screening for cardiovascular disorders: Secondary | ICD-10-CM | POA: Diagnosis not present

## 2015-11-15 DIAGNOSIS — N898 Other specified noninflammatory disorders of vagina: Secondary | ICD-10-CM | POA: Diagnosis not present

## 2015-11-15 DIAGNOSIS — E559 Vitamin D deficiency, unspecified: Secondary | ICD-10-CM | POA: Diagnosis not present

## 2015-11-15 DIAGNOSIS — B9689 Other specified bacterial agents as the cause of diseases classified elsewhere: Secondary | ICD-10-CM

## 2015-11-15 DIAGNOSIS — D649 Anemia, unspecified: Secondary | ICD-10-CM

## 2015-11-15 DIAGNOSIS — N760 Acute vaginitis: Secondary | ICD-10-CM

## 2015-11-15 DIAGNOSIS — R198 Other specified symptoms and signs involving the digestive system and abdomen: Secondary | ICD-10-CM

## 2015-11-15 DIAGNOSIS — Z1383 Encounter for screening for respiratory disorder NEC: Secondary | ICD-10-CM

## 2015-11-15 DIAGNOSIS — R5383 Other fatigue: Secondary | ICD-10-CM

## 2015-11-15 DIAGNOSIS — A499 Bacterial infection, unspecified: Secondary | ICD-10-CM

## 2015-11-15 DIAGNOSIS — K6289 Other specified diseases of anus and rectum: Secondary | ICD-10-CM

## 2015-11-15 LAB — CBC
HCT: 37.3 % (ref 36.0–46.0)
Hemoglobin: 12.1 g/dL (ref 12.0–15.0)
MCH: 29.6 pg (ref 26.0–34.0)
MCHC: 32.4 g/dL (ref 30.0–36.0)
MCV: 91.2 fL (ref 78.0–100.0)
MPV: 10.5 fL (ref 8.6–12.4)
PLATELETS: 347 10*3/uL (ref 150–400)
RBC: 4.09 MIL/uL (ref 3.87–5.11)
RDW: 12.7 % (ref 11.5–15.5)
WBC: 6.8 10*3/uL (ref 4.0–10.5)

## 2015-11-15 LAB — POCT URINALYSIS DIP (MANUAL ENTRY)
BILIRUBIN UA: NEGATIVE
Glucose, UA: NEGATIVE
Ketones, POC UA: NEGATIVE
Leukocytes, UA: NEGATIVE
NITRITE UA: NEGATIVE
PH UA: 6
Protein Ur, POC: NEGATIVE
RBC UA: NEGATIVE
SPEC GRAV UA: 1.015
UROBILINOGEN UA: 0.2

## 2015-11-15 LAB — POCT WET + KOH PREP
Trich by wet prep: ABSENT
Yeast by KOH: ABSENT
Yeast by wet prep: ABSENT

## 2015-11-15 LAB — COMPREHENSIVE METABOLIC PANEL
ALT: 13 U/L (ref 6–29)
AST: 14 U/L (ref 10–30)
Albumin: 4.3 g/dL (ref 3.6–5.1)
Alkaline Phosphatase: 41 U/L (ref 33–115)
BUN: 10 mg/dL (ref 7–25)
CHLORIDE: 103 mmol/L (ref 98–110)
CO2: 26 mmol/L (ref 20–31)
CREATININE: 1 mg/dL (ref 0.50–1.10)
Calcium: 9.3 mg/dL (ref 8.6–10.2)
GLUCOSE: 96 mg/dL (ref 65–99)
Potassium: 4.3 mmol/L (ref 3.5–5.3)
SODIUM: 139 mmol/L (ref 135–146)
Total Bilirubin: 0.4 mg/dL (ref 0.2–1.2)
Total Protein: 7.4 g/dL (ref 6.1–8.1)

## 2015-11-15 LAB — LIPID PANEL
CHOL/HDL RATIO: 2.7 ratio (ref <=5.0)
Cholesterol: 172 mg/dL (ref 125–200)
HDL: 63 mg/dL (ref >=46)
LDL Cholesterol: 89 mg/dL (ref <130)
Triglycerides: 98 mg/dL (ref <150)
VLDL: 20 mg/dL (ref <30)

## 2015-11-15 LAB — TSH: TSH: 1.98 mIU/L

## 2015-11-15 LAB — FERRITIN: FERRITIN: 11 ng/mL (ref 10–232)

## 2015-11-15 MED ORDER — AMITRIPTYLINE HCL 25 MG PO TABS: 25.0000 mg | ORAL_TABLET | Freq: Every day | ORAL | Status: DC

## 2015-11-15 MED ORDER — METRONIDAZOLE 500 MG PO TABS: 500.0000 mg | ORAL_TABLET | Freq: Two times a day (BID) | ORAL | Status: DC

## 2015-11-15 MED ORDER — CLONAZEPAM 1 MG PO TABS: 1.0000 mg | ORAL_TABLET | Freq: Every day | ORAL | Status: DC

## 2015-11-15 MED ORDER — AMLODIPINE BESYLATE-VALSARTAN 10-320 MG PO TABS: 1.0000 | ORAL_TABLET | Freq: Every day | ORAL | Status: DC

## 2015-11-15 NOTE — Progress Notes (Signed)
Subjective:    Patient ID: Pamela Allen, female    DOB: [birth year], 40 y.o.   MRN: [id number]    Chief Complaint  Patient presents with  . Annual Exam  . Gynecologic Exam     HPI  [name] is a 40 yo woman who is here for her CPE.  She was last seen by myself a year prior for the same.  Preventative Medicine:  She was not fasting at her CPE yest last yr Sees oprtho annually - has an appt  Mam: Had left breast abnml on screening mam last yr so left breast US was repeated6 mos piror and is scheuduled to have mamm and US done again at the end of this moth.  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 Immunizatoins: had flu shot this yr; tdap done 2013. Pap:   Pt has children 42 and 100 yo.  Her husband passed away 1 yr ago from pancreatic cancer so tried clonazpeam prn then.   HTN: Checks BP at home, on amlodipine and valsartan. Has been running 120s/70-80s H/o mild anemia so rec starting mvi with ca, vit D, and B complex. Vitamin D was 7, still on once weekly. Has been eating more fish and chicken, no red meats, lots of leafy greens.  Uses either valium or konoplin very sparingly. Tired/fatigued, not sleeping well.   Walking for exercise 3x/wk - 30 minutes.   Past Medical History  Diagnosis Date  . Hypertension    Past Surgical History  Procedure Laterality Date  . Tubal ligation     No current outpatient prescriptions on file prior to visit.   No current facility-administered medications on file prior to visit.   Allergies  Allergen Reactions  . Other     Neoprene in rubber   Family History  Problem Relation Age of Onset  . Diabetes Mother   . Heart disease Mother   . Hypertension Mother   . Stroke Mother   . Cancer Father   . Hypertension Father   . Stroke Sister    Social History   Social History  . Marital Status: Single    Spouse Name: N/A  . Number of Children: N/A  . Years  of Education: N/A   Social History Main Topics  . Smoking status: Never Smoker   . Smokeless tobacco: None  . Alcohol Use: No  . Drug Use: No  . Sexual Activity: No   Other Topics Concern  . None   Social History Narrative   Married   Cytogeneticist / CNA    Depression screen [facility] 2/9 11/15/2015  Decreased Interest 0  Down, Depressed, Hopeless 2  PHQ - 2 Score 2  Altered sleeping 3  Tired, decreased energy 3  Change in appetite 2  Feeling bad or failure about yourself  0  Trouble concentrating 0  Moving slowly or fidgety/restless 1  Suicidal thoughts 0  PHQ-9 Score 11     Review of Systems  Constitutional: Positive for activity change, appetite change and fatigue. Negative for unexpected weight change.  Eyes: Negative for visual disturbance.  Psychiatric/Behavioral: Positive for sleep disturbance, dysphoric mood and agitation. Negative for suicidal ideas, confusion, self-injury and decreased concentration. The patient is nervous/anxious.   All other systems reviewed and are negative.      Objective:  BP 119/80 mmHg  Pulse 84  Temp(Src) 98 F (36.7 C)  Resp 16  Ht 5' 2"  (1.575 m)  Wt 183 lb (83.008 kg)  BMI 33.46 kg/m2  LMP 11/09/2015  Physical Exam  Constitutional: She is oriented to person, place, and time. She appears well-developed and well-nourished. No distress.  HENT:  Head: Normocephalic and atraumatic.  Right Ear: Tympanic membrane, external ear and ear canal normal.  Left Ear: Tympanic membrane, external ear and ear canal normal.  Nose: Mucosal edema present. No rhinorrhea.  Mouth/Throat: Uvula is midline, oropharynx is clear and moist and mucous membranes are normal. No posterior oropharyngeal erythema.  Eyes: Conjunctivae and EOM are normal. Pupils are equal, round, and reactive to light. Right eye exhibits no discharge. Left eye exhibits no discharge. No scleral icterus.  Neck: Normal range of motion. Neck supple. No thyromegaly  present.  Cardiovascular: Normal rate, regular rhythm, normal heart sounds and intact distal pulses.   Pulmonary/Chest: Effort normal and breath sounds normal. No respiratory distress.  Abdominal: Soft. Bowel sounds are normal. There is no tenderness.  Genitourinary: Vagina normal and uterus normal. No breast swelling, tenderness, discharge or bleeding. Cervix exhibits no motion tenderness and no friability. Right adnexum displays no mass and no tenderness. Left adnexum displays no mass and no tenderness.  Musculoskeletal: She exhibits no edema.  Lymphadenopathy:    She has no cervical adenopathy.  Neurological: She is alert and oriented to person, place, and time. She has normal reflexes.  Skin: Skin is warm and dry. She is not diaphoretic. No erythema.     Rather large polyp-like mass on left inner lower buttock - approx 2 in dm soft benign appearing mass with thick base - feels like a lipoma and can feel some of the thicker tissue extend from the mass down into subcutaneous skin  Psychiatric: She has a normal mood and affect. Her behavior is normal.     EKG: NSR, no ischemic change Assessment & Plan:   1. Annual physical exam   2. Screening for breast cancer   3. Screening for cardiovascular, respiratory, and genitourinary diseases   4. Screening for cervical cancer   5. Anemia, unspecified anemia type   6. Screening for thyroid disorder   7. Vitamin D deficiency   8. Essential hypertension   9. Vaginal discharge   10. Other fatigue   11. Mass of perianal area - appears benign - feels like a lipoma but has grown out into a polyp rather than subcutaneously - however, this is quite large and at her underwear line so with size would be frequently inflamed and irritated causing pain and bleeding esp with acitvity/exericse so refer to surgery for removal - i expect this would be something that could be done with local only in the office.  12. BV (bacterial vaginosis)     Orders Placed  This Encounter  Procedures  . Comprehensive metabolic panel    Order Specific Question:  Has the patient fasted?    Answer:  Yes  . TSH  . Lipid panel    Order Specific Question:  Has the patient fasted?    Answer:  Yes  . CBC  . VITAMIN D 25 Hydroxy (Vit-D Deficiency, Fractures)  . Ferritin  . Ambulatory referral to General Surgery    Referral Priority:  Routine    Referral Type:  Surgical    Referral Reason:  Specialty Services Required    Requested Specialty:  General Surgery    Number of Visits Requested:  1  . POCT urinalysis dipstick  . POCT Wet +  KOH Prep  . EKG 12-Lead    Meds ordered this encounter  Medications  . ergocalciferol (VITAMIN D2) 50000 units capsule    Sig: Take 50,000 Units by mouth once a week.  . clonazePAM (KLONOPIN) 1 MG tablet    Sig: Take 1 tablet (1 mg total) by mouth at bedtime. For sleep.  Can take 1/2 tab qam as needed for anxiety    Dispense:  30 tablet    Refill:  1  . amLODipine-valsartan (EXFORGE) 10-320 MG tablet    Sig: Take 1 tablet by mouth daily.    Dispense:  90 tablet    Refill:  3  . amitriptyline (ELAVIL) 25 MG tablet    Sig: Take 1 tablet (25 mg total) by mouth at bedtime.    Dispense:  90 tablet    Refill:  3  . metroNIDAZOLE (FLAGYL) 500 MG tablet    Sig: Take 1 tablet (500 mg total) by mouth 2 (two) times daily.    Dispense:  14 tablet    Refill:  0     Delman Cheadle, MD MPH  Results for orders placed or performed in visit on 11/15/15  Comprehensive metabolic panel  Result Value Ref Range   Sodium 139 135 - 146 mmol/L   Potassium 4.3 3.5 - 5.3 mmol/L   Chloride 103 98 - 110 mmol/L   CO2 26 20 - 31 mmol/L   Glucose, Bld 96 65 - 99 mg/dL   BUN 10 7 - 25 mg/dL   Creat 1.00 0.50 - 1.10 mg/dL   Total Bilirubin 0.4 0.2 - 1.2 mg/dL   Alkaline Phosphatase 41 33 - 115 U/L   AST 14 10 - 30 U/L   ALT 13 6 - 29 U/L   Total Protein 7.4 6.1 - 8.1 g/dL   Albumin 4.3 3.6 - 5.1 g/dL   Calcium 9.3 8.6 - 10.2 mg/dL  TSH    Result Value Ref Range   TSH 1.98 mIU/L  Lipid panel  Result Value Ref Range   Cholesterol 172 125 - 200 mg/dL   Triglycerides 98 <150 mg/dL   HDL 63 >=46 mg/dL   Total CHOL/HDL Ratio 2.7 <=5.0 Ratio   VLDL 20 <30 mg/dL   LDL Cholesterol 89 <130 mg/dL  CBC  Result Value Ref Range   WBC 6.8 4.0 - 10.5 K/uL   RBC 4.09 3.87 - 5.11 MIL/uL   Hemoglobin 12.1 12.0 - 15.0 g/dL   HCT 37.3 36.0 - 46.0 %   MCV 91.2 78.0 - 100.0 fL   MCH 29.6 26.0 - 34.0 pg   MCHC 32.4 30.0 - 36.0 g/dL   RDW 12.7 11.5 - 15.5 %   Platelets 347 150 - 400 K/uL   MPV 10.5 8.6 - 12.4 fL  VITAMIN D 25 Hydroxy (Vit-D Deficiency, Fractures)  Result Value Ref Range   Vit D, 25-Hydroxy 23 (L) 30 - 100 ng/mL  Ferritin  Result Value Ref Range   Ferritin 11 10 - 232 ng/mL  POCT urinalysis dipstick  Result Value Ref Range   Color, UA yellow yellow   Clarity, UA clear clear   Glucose, UA negative negative   Bilirubin, UA negative negative   Ketones, POC UA negative negative   Spec Grav, UA 1.015    Blood, UA negative negative   pH, UA 6.0    Protein Ur, POC negative negative   Urobilinogen, UA 0.2    Nitrite, UA Negative Negative   Leukocytes, UA Negative Negative  POCT  Wet + KOH Prep  Result Value Ref Range   Yeast by KOH Absent Present, Absent   Yeast by wet prep Absent Present, Absent   WBC by wet prep None None, Few, Too numerous to count   Clue Cells Wet Prep HPF POC Few (A) None, Too numerous to count   Trich by wet prep Absent Present, Absent   Bacteria Wet Prep HPF POC Few None, Few, Too numerous to count   Epithelial Cells By [employer] ([facility]) Few None, Few, Too numerous to count   RBC,UR,HPF,POC None None RBC/hpf  Pap IG and HPV (high risk) DNA detection  Result Value Ref Range   HPV DNA High Risk Not Detected    Specimen adequacy: SEE NOTE    FINAL DIAGNOSIS: SEE NOTE    COMMENTS: SEE NOTE    Cytotechnologist: SEE NOTE

## 2015-11-15 NOTE — Patient Instructions (Signed)
Start an iron supplement once a day - I recommend ferrous sulfate 326m (644mof elemental iron).  Take this on an empty stomach 30 minutes before eating or 2 hours after a meal.  Take it with a vitamin C supplement or a small glass of orange juice.  Iron-Rich Diet Iron is a mineral that helps your body to produce hemoglobin. Hemoglobin is a protein in your red blood cells that carries oxygen to your body's tissues. Eating too little iron may cause you to feel weak and tired, and it can increase your risk for infection. Eating enough iron is necessary for your body's metabolism, muscle function, and nervous system. Iron is naturally found in many foods. It can also be added to foods or fortified in foods. There are two types of dietary iron:  Heme iron. Heme iron is absorbed by the body more easily than nonheme iron. Heme iron is found in meat, poultry, and fish.  Nonheme iron. Nonheme iron is found in dietary supplements, iron-fortified grains, beans, and vegetables. You may need to follow an iron-rich diet if:  You have been diagnosed with iron deficiency or iron-deficiency anemia.  You have a condition that prevents you from absorbing dietary iron, such as:  Infection in your intestines.  Celiac disease. This involves long-lasting (chronic) inflammation of your intestines.  You do not eat enough iron.  You eat a diet that is high in foods that impair iron absorption.  You have lost a lot of blood.  You have heavy bleeding during your menstrual cycle.  You are pregnant. WHAT IS MY PLAN? Your health care provider may help you to determine how much iron you need per day based on your condition. Generally, when a person consumes sufficient amounts of iron in the diet, the following iron needs are met:  Men.  1475838ears old: 11 mg per day.  197907ears old: 8 mg per day.  Women.   144862ears old: 15 mg per day.  197501ears old: 18 mg per day.  Over 5066ears old: 8 mg per  day.  Pregnant women: 27 mg per day.  Breastfeeding women: 9 mg per day. WHAT DO I NEED TO KNOW ABOUT AN IRON-RICH DIET?  Eat fresh fruits and vegetables that are high in vitamin C along with foods that are high in iron. This will help increase the amount of iron that your body absorbs from food, especially with foods containing nonheme iron. Foods that are high in vitamin C include oranges, peppers, tomatoes, and mango.  Take iron supplements only as directed by your health care provider. Overdose of iron can be life-threatening. If you were prescribed iron supplements, take them with orange juice or a vitamin C supplement.  Cook foods in pots and pans that are made from iron.   Eat nonheme iron-containing foods alongside foods that are high in heme iron. This helps to improve your iron absorption.   Certain foods and drinks contain compounds that impair iron absorption. Avoid eating these foods in the same meal as iron-rich foods or with iron supplements. These include:  Coffee, black tea, and red wine.  Milk, dairy products, and foods that are high in calcium.  Beans, soybeans, and peas.  Whole grains.  When eating foods that contain both nonheme iron and compounds that impair iron absorption, follow these tips to absorb iron better.   Soak beans overnight before cooking.  Soak whole grains overnight and drain them before using.  Ferment flours before  baking, such as using yeast in bread dough. WHAT FOODS CAN I EAT? Grains Iron-fortified breakfast cereal. Iron-fortified whole-wheat bread. Enriched rice. Sprouted grains. Vegetables Spinach. Potatoes with skin. Green peas. Broccoli. Red and green bell peppers. Fermented vegetables. Fruits Prunes. Raisins. Oranges. Strawberries. Mango. Grapefruit. Meats and Other Protein Sources Beef liver. Oysters. Beef. Shrimp. [place]. Chicken. [place]. Sardines. Chickpeas. Nuts. Tofu. Beverages Tomato juice. Fresh orange juice. Prune  juice. Hibiscus tea. Fortified instant breakfast shakes. Condiments Tahini. Fermented soy sauce. Sweets and Desserts Black-strap molasses.  Other Wheat germ. The items listed above may not be a complete list of recommended foods or beverages. Contact your dietitian for more options. WHAT FOODS ARE NOT RECOMMENDED? Grains Whole grains. Bran cereal. Bran flour. Oats. Vegetables Artichokes. Brussels sprouts. Kale. Fruits Blueberries. Raspberries. Strawberries. Figs. Meats and Other Protein Sources Soybeans. Products made from soy protein. Dairy Milk. Cream. Cheese. Yogurt. Cottage cheese. Beverages Coffee. Black tea. Red wine. Sweets and Desserts Cocoa. Chocolate. Ice cream. Other Basil. Oregano. Parsley. The items listed above may not be a complete list of foods and beverages to avoid. Contact your dietitian for more information.   This information is not intended to replace advice given to you by your health care provider. Make sure you discuss any questions you have with your health care provider.   Document Released: 04/15/2005 Document Revised: 09/22/2014 Document Reviewed: 03/29/2014 Elsevier Interactive Patient Education [employer].

## 2015-11-16 ENCOUNTER — Other Ambulatory Visit: Payer: Self-pay | Admitting: Physician Assistant

## 2015-11-16 LAB — VITAMIN D 25 HYDROXY (VIT D DEFICIENCY, FRACTURES): Vit D, 25-Hydroxy: 23 ng/mL — ABNORMAL LOW (ref 30–100)

## 2015-11-16 NOTE — Telephone Encounter (Signed)
Dr. Brigitte Pulse, can I refill this? On the lab notes it did not say continue but her vit d is still low.

## 2015-11-17 LAB — PAP IG AND HPV HIGH-RISK: HPV DNA HIGH RISK: NOT DETECTED

## 2015-12-14 ENCOUNTER — Ambulatory Visit
Admission: RE | Admit: 2015-12-14 | Discharge: 2015-12-14 | Disposition: A | Discharge status: Home / Self Care | Payer: Managed Care, Other (non HMO) | Source: Ambulatory Visit | Attending: Family Medicine | Admitting: Family Medicine

## 2015-12-14 ENCOUNTER — Other Ambulatory Visit: Payer: Self-pay | Admitting: Family Medicine

## 2015-12-14 DIAGNOSIS — N6320 Unspecified lump in the left breast, unspecified quadrant: Secondary | ICD-10-CM

## 2015-12-18 ENCOUNTER — Other Ambulatory Visit: Payer: Self-pay | Admitting: Family Medicine

## 2015-12-18 DIAGNOSIS — N6320 Unspecified lump in the left breast, unspecified quadrant: Secondary | ICD-10-CM

## 2015-12-19 ENCOUNTER — Ambulatory Visit
Admission: RE | Admit: 2015-12-19 | Discharge: 2015-12-19 | Disposition: A | Discharge status: Home / Self Care | Payer: Managed Care, Other (non HMO) | Source: Ambulatory Visit | Attending: Family Medicine | Admitting: Family Medicine

## 2015-12-19 DIAGNOSIS — N6320 Unspecified lump in the left breast, unspecified quadrant: Secondary | ICD-10-CM

## 2015-12-24 ENCOUNTER — Other Ambulatory Visit: Payer: Self-pay | Admitting: General Surgery

## 2016-01-08 ENCOUNTER — Encounter (HOSPITAL_BASED_OUTPATIENT_CLINIC_OR_DEPARTMENT_OTHER): Payer: Self-pay | Admitting: *Deleted

## 2016-01-08 NOTE — Progress Notes (Signed)
NPO AFTER MN.  ARRIVE AT 3845,.  NEEDS ISTAT AND EKG.  WILL TAKE EXFORGE AM DOS W/ SIPS OF WATER.

## 2016-01-17 ENCOUNTER — Encounter (HOSPITAL_BASED_OUTPATIENT_CLINIC_OR_DEPARTMENT_OTHER): Payer: Self-pay | Admitting: *Deleted

## 2016-01-17 ENCOUNTER — Ambulatory Visit (HOSPITAL_BASED_OUTPATIENT_CLINIC_OR_DEPARTMENT_OTHER): Payer: Managed Care, Other (non HMO) | Admitting: Anesthesiology

## 2016-01-17 ENCOUNTER — Encounter (HOSPITAL_BASED_OUTPATIENT_CLINIC_OR_DEPARTMENT_OTHER)
Admission: RE | Disposition: A | Discharge status: Home / Self Care | Payer: Self-pay | Source: Ambulatory Visit | Attending: General Surgery

## 2016-01-17 ENCOUNTER — Ambulatory Visit (HOSPITAL_BASED_OUTPATIENT_CLINIC_OR_DEPARTMENT_OTHER)
Admission: RE | Admit: 2016-01-17 | Discharge: 2016-01-17 | Disposition: A | Discharge status: Home / Self Care | Payer: Managed Care, Other (non HMO) | Source: Ambulatory Visit | Attending: General Surgery | Admitting: General Surgery

## 2016-01-17 DIAGNOSIS — Z79899 Other long term (current) drug therapy: Secondary | ICD-10-CM | POA: Diagnosis not present

## 2016-01-17 DIAGNOSIS — D171 Benign lipomatous neoplasm of skin and subcutaneous tissue of trunk: Secondary | ICD-10-CM | POA: Insufficient documentation

## 2016-01-17 DIAGNOSIS — I10 Essential (primary) hypertension: Secondary | ICD-10-CM | POA: Insufficient documentation

## 2016-01-17 DIAGNOSIS — Z6833 Body mass index (BMI) 33.0-33.9, adult: Secondary | ICD-10-CM | POA: Diagnosis not present

## 2016-01-17 DIAGNOSIS — F329 Major depressive disorder, single episode, unspecified: Secondary | ICD-10-CM | POA: Insufficient documentation

## 2016-01-17 DIAGNOSIS — R222 Localized swelling, mass and lump, trunk: Secondary | ICD-10-CM | POA: Diagnosis present

## 2016-01-17 HISTORY — DX: Reserved for concepts with insufficient information to code with codable children: IMO0002

## 2016-01-17 HISTORY — PX: MASS EXCISION: SHX2000

## 2016-01-17 HISTORY — DX: Localized swelling, mass and lump, unspecified: R22.9

## 2016-01-17 LAB — POCT I-STAT 4, (NA,K, GLUC, HGB,HCT)
GLUCOSE: 106 mg/dL — AB (ref 65–99)
HCT: 40 % (ref 36.0–46.0)
HEMOGLOBIN: 13.6 g/dL (ref 12.0–15.0)
POTASSIUM: 3.5 mmol/L (ref 3.5–5.1)
Sodium: 140 mmol/L (ref 135–145)

## 2016-01-17 SURGERY — EXCISION MASS
Anesthesia: Monitor Anesthesia Care | Laterality: Left

## 2016-01-17 MED ORDER — FENTANYL CITRATE (PF) 100 MCG/2ML IJ SOLN
INTRAMUSCULAR | Status: DC | PRN
  Administered 2016-01-17 (×2): 50 ug via INTRAVENOUS

## 2016-01-17 MED ORDER — ACETAMINOPHEN 650 MG RE SUPP
650.0000 mg | RECTAL | Status: DC | PRN
  Filled 2016-01-17: qty 1

## 2016-01-17 MED ORDER — HYDROCODONE-ACETAMINOPHEN 5-325 MG PO TABS: 1.0000 | ORAL_TABLET | ORAL | Status: DC | PRN

## 2016-01-17 MED ORDER — PROPOFOL 500 MG/50ML IV EMUL
INTRAVENOUS | Status: DC | PRN
  Administered 2016-01-17: 50 ug/kg/min via INTRAVENOUS

## 2016-01-17 MED ORDER — SODIUM CHLORIDE 0.9% FLUSH
3.0000 mL | INTRAVENOUS | Status: DC | PRN
  Filled 2016-01-17: qty 3

## 2016-01-17 MED ORDER — LACTATED RINGERS IV SOLN
INTRAVENOUS | Status: DC
  Administered 2016-01-17: 09:00:00 via INTRAVENOUS
  Filled 2016-01-17: qty 1000

## 2016-01-17 MED ORDER — CEFAZOLIN SODIUM-DEXTROSE 2-4 GM/100ML-% IV SOLN
2.0000 g | INTRAVENOUS | Status: AC
  Administered 2016-01-17: 2 g via INTRAVENOUS
  Filled 2016-01-17: qty 100

## 2016-01-17 MED ORDER — OXYCODONE HCL 5 MG/5ML PO SOLN
5.0000 mg | Freq: Once | ORAL | Status: DC | PRN
  Filled 2016-01-17: qty 5

## 2016-01-17 MED ORDER — LIDOCAINE HCL (CARDIAC) 20 MG/ML IV SOLN
INTRAVENOUS | Status: AC
Start: 2016-01-17 — End: 2016-01-17
  Filled 2016-01-17: qty 5

## 2016-01-17 MED ORDER — ACETAMINOPHEN 160 MG/5ML PO SOLN
325.0000 mg | ORAL | Status: DC | PRN
  Filled 2016-01-17: qty 20.3

## 2016-01-17 MED ORDER — FENTANYL CITRATE (PF) 100 MCG/2ML IJ SOLN
25.0000 ug | INTRAMUSCULAR | Status: DC | PRN
  Filled 2016-01-17: qty 1

## 2016-01-17 MED ORDER — ACETAMINOPHEN 325 MG PO TABS
325.0000 mg | ORAL_TABLET | ORAL | Status: DC | PRN
  Filled 2016-01-17: qty 2

## 2016-01-17 MED ORDER — CEFAZOLIN SODIUM-DEXTROSE 2-4 GM/100ML-% IV SOLN
INTRAVENOUS | Status: AC
  Filled 2016-01-17: qty 100

## 2016-01-17 MED ORDER — BUPIVACAINE HCL 0.5 % IJ SOLN
INTRAMUSCULAR | Status: DC | PRN
  Administered 2016-01-17: 10 mL

## 2016-01-17 MED ORDER — DIPHENHYDRAMINE HCL 50 MG/ML IJ SOLN
INTRAMUSCULAR | Status: AC
Start: 2016-01-17 — End: 2016-01-17
  Filled 2016-01-17: qty 1

## 2016-01-17 MED ORDER — FENTANYL CITRATE (PF) 100 MCG/2ML IJ SOLN
INTRAMUSCULAR | Status: AC
  Filled 2016-01-17: qty 2

## 2016-01-17 MED ORDER — LIDOCAINE HCL (CARDIAC) 20 MG/ML IV SOLN
INTRAVENOUS | Status: DC | PRN
  Administered 2016-01-17: 50 mg via INTRAVENOUS

## 2016-01-17 MED ORDER — OXYCODONE HCL 5 MG PO TABS
5.0000 mg | ORAL_TABLET | Freq: Once | ORAL | Status: DC | PRN
  Filled 2016-01-17: qty 1

## 2016-01-17 MED ORDER — LIDOCAINE HCL (CARDIAC) 20 MG/ML IV SOLN
INTRAVENOUS | Status: AC
  Filled 2016-01-17: qty 5

## 2016-01-17 MED ORDER — ACETAMINOPHEN 325 MG PO TABS
650.0000 mg | ORAL_TABLET | ORAL | Status: DC | PRN
  Filled 2016-01-17: qty 2

## 2016-01-17 MED ORDER — SODIUM CHLORIDE 0.9 % IV SOLN
250.0000 mL | INTRAVENOUS | Status: DC | PRN
  Filled 2016-01-17: qty 250

## 2016-01-17 MED ORDER — ONDANSETRON HCL 4 MG/2ML IJ SOLN
INTRAMUSCULAR | Status: DC | PRN
  Administered 2016-01-17: 4 mg via INTRAVENOUS

## 2016-01-17 MED ORDER — SODIUM CHLORIDE 0.9% FLUSH
3.0000 mL | Freq: Two times a day (BID) | INTRAVENOUS | Status: DC
  Filled 2016-01-17: qty 3

## 2016-01-17 MED ORDER — MIDAZOLAM HCL 5 MG/5ML IJ SOLN
INTRAMUSCULAR | Status: DC | PRN
  Administered 2016-01-17: 2 mg via INTRAVENOUS

## 2016-01-17 MED ORDER — DEXAMETHASONE SODIUM PHOSPHATE 4 MG/ML IJ SOLN
INTRAMUSCULAR | Status: DC | PRN
  Administered 2016-01-17: 10 mg via INTRAVENOUS

## 2016-01-17 MED ORDER — OXYCODONE HCL 5 MG PO TABS
5.0000 mg | ORAL_TABLET | ORAL | Status: DC | PRN
Start: 2016-01-17 — End: 2016-01-17
  Filled 2016-01-17: qty 2

## 2016-01-17 MED ORDER — MIDAZOLAM HCL 2 MG/2ML IJ SOLN
INTRAMUSCULAR | Status: AC
  Filled 2016-01-17: qty 2

## 2016-01-17 SURGICAL SUPPLY — 45 items
APL SKNCLS STERI-STRIP NONHPOA (GAUZE/BANDAGES/DRESSINGS)
BENZOIN TINCTURE PRP APPL 2/3 (GAUZE/BANDAGES/DRESSINGS) IMPLANT
BLADE CLIPPER SURG (BLADE) IMPLANT
BLADE SURG 10 STRL SS (BLADE) ×3 IMPLANT
CHLORAPREP W/TINT 26ML (MISCELLANEOUS) ×3 IMPLANT
CLOSURE WOUND 1/2 X4 (GAUZE/BANDAGES/DRESSINGS)
COVER BACK TABLE 60X90IN (DRAPES) ×3 IMPLANT
COVER MAYO STAND STRL (DRAPES) ×3 IMPLANT
DECANTER SPIKE VIAL GLASS SM (MISCELLANEOUS) IMPLANT
DRAPE LAPAROTOMY 100X72 PEDS (DRAPES) ×3 IMPLANT
DRAPE UTILITY XL STRL (DRAPES) ×3 IMPLANT
DRSG TEGADERM 4X4.75 (GAUZE/BANDAGES/DRESSINGS) IMPLANT
ELECT BLADE 6.5 .24CM SHAFT (ELECTRODE) IMPLANT
ELECT REM PT RETURN 9FT ADLT (ELECTROSURGICAL) ×3
ELECTRODE REM PT RTRN 9FT ADLT (ELECTROSURGICAL) ×1 IMPLANT
GLOVE BIO SURGEON STRL SZ 6.5 (GLOVE) ×4 IMPLANT
GLOVE BIO SURGEONS STRL SZ 6.5 (GLOVE) ×2
GLOVE INDICATOR 7.0 STRL GRN (GLOVE) ×3 IMPLANT
GOWN STRL REUS W/ TWL XL LVL3 (GOWN DISPOSABLE) ×1 IMPLANT
GOWN STRL REUS W/TWL 2XL LVL3 (GOWN DISPOSABLE) ×3 IMPLANT
GOWN STRL REUS W/TWL XL LVL3 (GOWN DISPOSABLE) ×3
KIT ROOM TURNOVER WOR (KITS) ×3 IMPLANT
LIQUID BAND (GAUZE/BANDAGES/DRESSINGS) ×3 IMPLANT
MANIFOLD NEPTUNE II (INSTRUMENTS) IMPLANT
NEEDLE HYPO 22GX1.5 SAFETY (NEEDLE) ×3 IMPLANT
NS IRRIG 500ML POUR BTL (IV SOLUTION) IMPLANT
PAD ARMBOARD 7.5X6 YLW CONV (MISCELLANEOUS) IMPLANT
PENCIL BUTTON HOLSTER BLD 10FT (ELECTRODE) ×3 IMPLANT
SPONGE GAUZE 4X4 12PLY (GAUZE/BANDAGES/DRESSINGS) IMPLANT
STRIP CLOSURE SKIN 1/2X4 (GAUZE/BANDAGES/DRESSINGS) IMPLANT
SUT ETHILON 2 0 FS 18 (SUTURE) IMPLANT
SUT ETHILON 4 0 PS 2 18 (SUTURE) IMPLANT
SUT MNCRL AB 4-0 PS2 18 (SUTURE) ×3 IMPLANT
SUT SILK 2 0 SH (SUTURE) IMPLANT
SUT VIC AB 2-0 SH 27 (SUTURE)
SUT VIC AB 2-0 SH 27XBRD (SUTURE) IMPLANT
SUT VICRYL 4-0 PS2 18IN ABS (SUTURE) IMPLANT
SYR CONTROL 10ML LL (SYRINGE) ×3 IMPLANT
TOWEL OR 17X24 6PK STRL BLUE (TOWEL DISPOSABLE) ×3 IMPLANT
TUBE ANAEROBIC SPECIMEN COL (MISCELLANEOUS) IMPLANT
TUBE CONNECTING 12'X1/4 (SUCTIONS)
TUBE CONNECTING 12X1/4 (SUCTIONS) IMPLANT
UNDERPAD 30X30 INCONTINENT (UNDERPADS AND DIAPERS) IMPLANT
WATER STERILE IRR 500ML POUR (IV SOLUTION) ×3 IMPLANT
YANKAUER SUCT BULB TIP NO VENT (SUCTIONS) IMPLANT

## 2016-01-17 NOTE — Op Note (Signed)
01/17/2016  9:59 AM  PATIENT:  Pamela Allen  40 y.o. female  Patient Care Team: Shawnee Knapp, MD as PCP - General (Family Medicine)  PRE-OPERATIVE DIAGNOSIS:  Mass RIGHT buttock   POST-OPERATIVE DIAGNOSIS:  Mass RIGHT buttock   PROCEDURE:  Procedure(s): EXCISION BIOPSY OF MASS RIGHT BUTTOCK   SURGEON:  Surgeon(s): Leighton Ruff, MD  ASSISTANT: none   ANESTHESIA:   local and MAC  EBL:  Total I/O In: 300 [I.V.:300] Out: -   DRAINS: none   SPECIMEN:  Source of Specimen:  Right buttock lipoma  DISPOSITION OF SPECIMEN:  PATHOLOGY  COUNTS:  YES  PLAN OF CARE: Discharge to home after PACU  PATIENT DISPOSITION:  PACU - hemodynamically stable.  INDICATION: 40 year old female with a pedunculated right buttock mass.  It has gotten larger over time and is starting to become painful for the patient to sit.   OR FINDINGS: Right buttock mass  DESCRIPTION: the patient was identified in the preoperative holding area and taken to the OR where they were laid prone on the operating room table.  MAC anesthesia was induced without difficulty. SCDs were also noted to be in place prior to the initiation of anesthesia.  The patient was then prepped and draped in the usual sterile fashion.   A surgical timeout was performed indicating the correct patient, procedure, positioning and need for preoperative antibiotics.   Local anesthesia was infused subcutaneously around the mass. An elliptical incision was made with a 15 blade scalpel. Dissection was carried down around the lipomatous section of tissue using electrocautery. The entire specimen was removed. This was sent to pathology for further examination. The subcutaneous layer was closed using interrupted 2-0 Vicryl sutures. The dermal layer was closed using interrupted 2-0 Vicryl sutures and the skin was closed using a 3-0 running subcuticular Vicryl suture. Skin adhesive was placed over this. The patient was then awakened from anesthesia and  sent to the postanesthesia care unit in stable condition. All counts were correct per operating room staff.

## 2016-01-17 NOTE — Discharge Instructions (Addendum)
GENERAL SURGERY: POST OP INSTRUCTIONS  1. DIET: Follow a light bland diet the first 24 hours after arrival home, such as soup, liquids, crackers, etc.  Be sure to include lots of fluids daily.  Avoid fast food or heavy meals as your are more likely to get nauseated.   2. Take your usually prescribed home medications unless otherwise directed. 3. PAIN CONTROL: a. Pain is best controlled by a usual combination of three different methods TOGETHER: i. Ice/Heat ii. Over the counter pain medication iii. Prescription pain medication b. Most patients will experience some swelling and bruising around the incisions.  Ice packs or heating pads (30-60 minutes up to 6 times a day) will help. Use ice for the first few days to help decrease swelling and bruising, then switch to heat to help relax tight/sore spots and speed recovery.  Some people prefer to use ice alone, heat alone, alternating between ice & heat.  Experiment to what works for you.  Swelling and bruising can take several weeks to resolve.   c. It is helpful to take an over-the-counter pain medication regularly for the first few weeks.  Choose one of the following that works best for you: i. Naproxen (Aleve, etc)  Two 249m tabs twice a day ii. Ibuprofen (Advil, etc) Three 2047mtabs four times a day (every meal & bedtime) d. A  prescription for pain medication (such as Percocet, oxycodone, hydrocodone, etc) should be given to you upon discharge.  Take your pain medication as prescribed.  i. If you are having problems/concerns with the prescription medicine (does not control pain, nausea, vomiting, rash, itching, etc), please call [phone] see if we need to switch you to a different pain medicine that will work better for you and/or control your side effect better. ii. If you need a refill on your pain medication, please contact your pharmacy.  They will contact our office to request authorization. Prescriptions will not be filled after 5  pm or on week-ends. 4. Avoid getting constipated.  Between the surgery and the pain medications, it is common to experience some constipation.  Increasing fluid intake and taking a fiber supplement (such as Metamucil, Citrucel, FiberCon, MiraLax, etc) 1-2 times a day regularly will usually help prevent this problem from occurring.  A mild laxative (prune juice, Milk of Magnesia, MiraLax, etc) should be taken according to package directions if there are no bowel movements after 48 hours.   5. Wash / shower every day.  You may shower over the dressings as they are waterproof.   6. You have a sterile glue over your incision.  It will wear off with time. 7. ACTIVITIES as tolerated:   a. You may resume regular (light) daily activities beginning the next day--such as daily self-care, walking, climbing stairs--gradually increasing activities as tolerated.  If you can walk 30 minutes without difficulty, it is safe to try more intense activity such as jogging, treadmill, bicycling, low-impact aerobics, swimming, etc. b. Save the most intensive and strenuous activity for last such as sit-ups, heavy lifting, contact sports, etc  Refrain from any heavy lifting or straining until you are off narcotics for pain control.   c. DO NOT PUSH THROUGH PAIN.  Let pain be your guide: If it hurts to do something, don't do it.  Pain is your body warning you to avoid that activity for another week until the pain goes down. d. You may drive when you are no longer taking prescription pain medication, you can  comfortably wear a seatbelt, and you can safely maneuver your car and apply brakes. e. Pamela Allen may have sexual intercourse when it is comfortable.  8. FOLLOW UP in our office a. Please call [facility] at [phone] to set up an appointment to see your surgeon in the office for a follow-up appointment approximately 2-3 weeks after your surgery. b. Make sure that you call for this appointment the day you arrive home to insure a  convenient appointment time. 9. IF YOU HAVE DISABILITY OR FAMILY LEAVE FORMS, BRING THEM TO THE OFFICE FOR PROCESSING.  DO NOT GIVE THEM TO YOUR DOCTOR.   WHEN TO CALL us [phone]: 1. Poor pain control 2. Reactions / problems with new medications (rash/itching, nausea, etc)  3. Fever over 101.5 F (38.5 C) 4. Worsening swelling or bruising 5. Continued bleeding from incision. 6. Increased pain, redness, or drainage from the incision   The clinic staff is available to answer your questions during regular business hours (8:30am-5pm).  Please dont hesitate to call and ask to speak to one of our nurses for clinical concerns.   If you have a medical emergency, go to the nearest emergency room or call 911.  A surgeon from [facility] is always on call at the [facility], [place], [place], [place], [place]  [address] ? MAIN: [phone] ? TOLL FREE: [phone] ?  FAX [phone] [email or web address]   Post Anesthesia Home Care Instructions  Activity: Get plenty of rest for the remainder of the day. A responsible adult should stay with you for 24 hours following the procedure.  For the next 24 hours, DO NOT: -Drive a car -Paediatric nurse -Drink alcoholic beverages -Take any medication unless instructed by your physician -Make any legal decisions or sign important papers.  Meals: Start with liquid foods such as gelatin or soup. Progress to regular foods as tolerated. Avoid greasy, spicy, heavy foods. If nausea and/or vomiting occur, drink only clear liquids until the nausea and/or vomiting subsides. Call your physician if vomiting continues.  Special Instructions/Symptoms: Your throat may feel dry or sore from the anesthesia or the breathing tube placed in your throat during surgery. If this causes discomfort, gargle with warm salt water. The discomfort should disappear within 24 hours.  If you had a  scopolamine patch placed behind your ear for the management of post- operative nausea and/or vomiting:  1. The medication in the patch is effective for 72 hours, after which it should be removed.  Wrap patch in a tissue and discard in the trash. Wash hands thoroughly with soap and water. 2. You may remove the patch earlier than 72 hours if you experience unpleasant side effects which may include dry mouth, dizziness or visual disturbances. 3. Avoid touching the patch. Wash your hands with soap and water after contact with the patch.

## 2016-01-17 NOTE — H&P (Signed)
History of Present Illness Pamela Ruff MD; 11/20/6576 10:53 AM) Patient words: perianal mass.  The patient is a 40 year old female who presents with a complaint of Mass. 40 year old female who was found to have a mass on the right inner buttock approximately 2 cm in diameter. She states that over the past few months this has become very sore and irritated. It is mostly sore when she sits. She denies any other symptoms. She has never had anything like this before.   Other Problems Pamela Lorenzo, LPN; 4/69/6295 28:41 AM) Depression High blood pressure  Diagnostic Studies History Pamela Lorenzo, LPN; 12/06/4008 27:25 AM) Colonoscopy never Mammogram within last year Pap Smear 1-5 years ago  Allergies Pamela Lorenzo, LPN; 3/66/4403 47:42 AM) No Known Drug Allergies04/06/2016  Medication History Pamela Lorenzo, LPN; 5/95/6387 56:43 AM) Amitriptyline HCl (25MG Tablet, Oral) Active. Amlodipine Besylate-Valsartan (10-320MG Tablet, Oral) Active. Vitamin D (Ergocalciferol) (50000UNIT Capsule, Oral) Active. KlonoPIN (1MG Tablet, Oral as needed) Active. Medications Reconciled  Social History Pamela Lorenzo, LPN; 12/12/5186 41:66 AM) Alcohol use Occasional alcohol use. Caffeine use Tea. No drug use Tobacco use Never smoker.  Family History Pamela Lorenzo, LPN; 0/63/0160 10:93 AM) Cancer Father. Cerebrovascular Accident Mother. Diabetes Mellitus Mother. Heart disease in female family member before age 70 Hypertension Father, Mother. Kidney Disease Mother.  Pregnancy / Birth History Pamela Lorenzo, LPN; 2/35/5732 20:25 AM) Age at menarche 53 years. Gravida 4 Maternal age 59-20 Para 4 Regular periods    Review of Systems Pamela Billings Dockery LPN; 01/09/622 76:28 AM) General Not Present- Appetite Loss, Chills, Fatigue, Fever, Night Sweats, Weight Gain and Weight Loss. HEENT Present- Wears glasses/contact lenses. Not Present- Earache, Hearing Loss,  Hoarseness, Nose Bleed, Oral Ulcers, Ringing in the Ears, Seasonal Allergies, Sinus Pain, Sore Throat, Visual Disturbances and Yellow Eyes. Respiratory Not Present- Bloody sputum, Chronic Cough, Difficulty Breathing, Snoring and Wheezing. Breast Not Present- Breast Mass, Breast Pain, Nipple Discharge and Skin Changes. Cardiovascular Not Present- Chest Pain, Difficulty Breathing Lying Down, Leg Cramps, Palpitations, Rapid Heart Rate, Shortness of Breath and Swelling of Extremities. Gastrointestinal Not Present- Abdominal Pain, Bloating, Bloody Stool, Change in Bowel Habits, Chronic diarrhea, Constipation, Difficulty Swallowing, Excessive gas, Gets full quickly at meals, Hemorrhoids, Indigestion, Nausea, Rectal Pain and Vomiting. Female Genitourinary Not Present- Frequency, Nocturia, Painful Urination, Pelvic Pain and Urgency. Musculoskeletal Not Present- Back Pain, Joint Pain, Joint Stiffness, Muscle Pain, Muscle Weakness and Swelling of Extremities. Neurological Not Present- Decreased Memory, Fainting, Headaches, Numbness, Seizures, Tingling, Tremor, Trouble walking and Weakness. Psychiatric Present- Depression. Not Present- Anxiety, Bipolar, Change in Sleep Pattern, Fearful and Frequent crying. Endocrine Not Present- Cold Intolerance, Excessive Hunger, Hair Changes, Heat Intolerance, Hot flashes and New Diabetes. Hematology Not Present- Easy Bruising, Excessive bleeding, Gland problems, HIV and Persistent Infections.  Vitals Pamela Billings Dockery LPN; 11/28/1759 60:73 AM) 12/24/2015 10:42 AM Weight: 182 lb Height: 62in Body Surface Area: 1.84 m Body Mass Index: 33.29 kg/m  Temp.: 98.93F(Oral)  Pulse: 76 (Regular)  BP: 122/78 (Sitting, Left Arm, Standard)       Physical Exam Pamela Ruff MD; 03/24/6268 10:57 AM) General Mental Status-Alert. General Appearance-Not in acute distress. Build & Nutrition-Well nourished. Posture-Normal posture. Gait-Normal.  Head and  Neck Head-normocephalic, atraumatic with no lesions or palpable masses. Trachea-midline.  Chest and Lung Exam Chest and lung exam reveals -on auscultation, normal breath sounds, no adventitious sounds and normal vocal resonance.  Cardiovascular Cardiovascular examination reveals -normal heart sounds, regular rate and rhythm with no murmurs.  Abdomen Inspection Inspection of the abdomen reveals -  No Hernias. Palpation/Percussion Palpation and Percussion of the abdomen reveal - Soft, Non Tender, No Rigidity (guarding), No hepatosplenomegaly and No Palpable abdominal masses.  Rectal Anorectal Exam External - Note: Polypoid mass approximately 3 x 5 cm projecting from the inferior right buttock. Internal border unclear on exam.  Neurologic Neurologic evaluation reveals -alert and oriented x 3 with no impairment of recent or remote memory, normal attention span and ability to concentrate, normal sensation and normal coordination.  Musculoskeletal Normal Exam - Bilateral-Upper Extremity Strength Normal and Lower Extremity Strength Normal.    Assessment & Plan Pamela Ruff MD; 0/76/2263 10:58 AM) MASS OF SKIN (R22.9) Impression: 40 year old female with a mass of the right buttock causing significant irritation and pain. On exam this is approximately 3 x 5 cm. It was projecting out from the skin. I recommended excisional biopsy. Risk and benefits were explained to the patient and she has agreed to proceed with surgery.  Risks include bleeding, infection and wound dehiscence.  We have discussed ways to limit this as much as possible post operatively.  We will plan on doing this in the outpatient surgical center.

## 2016-01-17 NOTE — Anesthesia Preprocedure Evaluation (Signed)
Anesthesia Evaluation  Patient identified by MRN, date of birth, ID band Patient awake    Reviewed: Allergy & Precautions, NPO status , Patient's Chart, lab work & pertinent test results  History of Anesthesia Complications Negative for: history of anesthetic complications  Airway Mallampati: II  TM Distance: >3 FB Neck ROM: Full    Dental  (+) Teeth Intact   Pulmonary neg pulmonary ROS,    breath sounds clear to auscultation       Cardiovascular hypertension, Pt. on medications (-) angina(-) Past MI and (-) CHF  Rhythm:Regular     Neuro/Psych PSYCHIATRIC DISORDERS Anxiety negative neurological ROS     GI/Hepatic negative GI ROS, Neg liver ROS,   Endo/Other  Morbid obesity  Renal/GU negative Renal ROS     Musculoskeletal   Abdominal   Peds  Hematology   Anesthesia Other Findings   Reproductive/Obstetrics                             Anesthesia Physical Anesthesia Plan  ASA: II  Anesthesia Plan: MAC   Post-op Pain Management:    Induction: Intravenous  Airway Management Planned: Natural Airway, Nasal Cannula and Simple Face Mask  Additional Equipment: None  Intra-op Plan:   Post-operative Plan:   Informed Consent: I have reviewed the patients History and Physical, chart, labs and discussed the procedure including the risks, benefits and alternatives for the proposed anesthesia with the patient or authorized representative who has indicated his/her understanding and acceptance.   Dental advisory given  Plan Discussed with: CRNA and Surgeon  Anesthesia Plan Comments:         Anesthesia Quick Evaluation

## 2016-01-17 NOTE — Transfer of Care (Signed)
Last Vitals:  Filed Vitals:   01/17/16 0835  BP: 136/79  Pulse: 93  Temp: 36.6 C  Resp: 16    Last Pain: There were no vitals filed for this visit.   Immediate Anesthesia Transfer of Care Note  Patient: Pamela Allen  Procedure(s) Performed: Procedure(s) (LRB): EXCISION BIOPSY OF MASS LEFT BUTTOCK  (Left)  Patient Location: PACU  Anesthesia Type: General  Level of Consciousness: awake, alert  and oriented  Airway & Oxygen Therapy: Patient Spontanous Breathing and Patient connected to nasal cannula oxygen  Post-op Assessment: Report given to PACU RN and Post -op Vital signs reviewed and stable  Post vital signs: Reviewed and stable  Complications: No apparent anesthesia complications

## 2016-01-17 NOTE — Anesthesia Postprocedure Evaluation (Signed)
Anesthesia Post Note  Patient: Pamela Allen  Procedure(s) Performed: Procedure(s) (LRB): EXCISION BIOPSY OF MASS LEFT BUTTOCK  (Left)  Patient location during evaluation: PACU Anesthesia Type: MAC Level of consciousness: awake Pain management: pain level controlled Vital Signs Assessment: post-procedure vital signs reviewed and stable Respiratory status: spontaneous breathing Cardiovascular status: stable Postop Assessment: no signs of nausea or vomiting Anesthetic complications: no    Last Vitals:  Filed Vitals:   01/17/16 1030 01/17/16 1105  BP: 129/99 133/87  Pulse: 93 80  Temp:  36.8 C  Resp: 13 16    Last Pain:  Filed Vitals:   01/17/16 1127  PainSc: 0-No pain                 Laban Orourke

## 2016-01-17 NOTE — Anesthesia Procedure Notes (Signed)
Procedure Name: MAC Date/Time: 01/17/2016 9:20 AM Performed by: Mechele Claude Pre-anesthesia Checklist: Patient identified, Emergency Drugs available, Suction available, Patient being monitored and Timeout performed Patient Re-evaluated:Patient Re-evaluated prior to inductionOxygen Delivery Method: Nasal cannula Intubation Type: IV induction Placement Confirmation: positive ETCO2 and breath sounds checked- equal and bilateral

## 2016-01-18 ENCOUNTER — Encounter (HOSPITAL_BASED_OUTPATIENT_CLINIC_OR_DEPARTMENT_OTHER): Payer: Self-pay | Admitting: General Surgery

## 2016-02-26 ENCOUNTER — Encounter: Payer: Self-pay | Admitting: Family Medicine

## 2016-03-18 ENCOUNTER — Encounter: Admit: 2016-03-19 | Primary: Adult Medicine

## 2016-03-18 DIAGNOSIS — S76211A Strain of adductor muscle, fascia and tendon of right thigh, initial encounter: Secondary | ICD-10-CM

## 2016-03-18 NOTE — ED Triage Notes (Signed)
Pt states was chasing someone on foot for job and hit a pot hole twisting on the way down. C/O R groin and foot pain and L knee pain.

## 2016-03-18 NOTE — ED Provider Notes (Signed)
Patient Identification  Regina Potter is a 40 y.o. female    Chief Complaint  Fall      HPI  (History provided by patient)  This is a 40 y.o. female who was brought in by self for chief complaint of fall.  Onset was just PTA.  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  States fell forward and caught herself on left knee and bilateral hands.  Denies UE pain but does have pain in right groin, left knee, and right heel.  Pain is 7/10 and aching.  Constant since onset.  Notes abrasion on left knee.  Denies head injury, neck or back pain.  Able to walk after injuries.        REVIEW OF SYSTEMS    Constitutional:  Denies fever, chills  Musculoskeletal:  Denies back pain.  + left knee, right groin, right heel pain  Skin:  Denies rash  Neurologic:  Denies focal weakness, or sensory changes     See HPI and nursing notes for additional information     I have reviewed the following nursing documentation:  Allergies: No Known Allergies    Past medical history:  has no past medical history on file.    Past surgical history:  has a past surgical history that includes Tubal ligation.    Home medications:   Prior to Admission medications    Medication Sig Start Date End Date Taking? Authorizing Provider   ibuprofen (IBU) 800 MG tablet Take 1 tablet by mouth every 6 hours as needed for Pain. 12/13/11   Johny Shock, PA       Social history:  reports that she has never smoked. She does not have any smokeless tobacco history on file. She reports that she does not drink alcohol or use illicit drugs.    Family history:  History reviewed. No pertinent family history.      Exam  BP 102/64   Pulse 92   Temp 98 ??F (36.7 ??C) (Oral)    Resp 16   Ht 6' (1.829 m)   Wt 200 lb (90.7 kg)   LMP 02/19/2016 (Approximate)   SpO2 98%   BMI 27.12 kg/m2  Nursing note and vitals reviewed.  Constitutional: Well developed, well nourished. No acute distress.    HENT:      Head: Normocephalic and atraumatic.      Ears:  External ears normal.      Nose: Nose normal.  Cardiovascular: DP and PT pulses 2+ bilaterally.  Pulmonary/Chest: Effort normal. No respiratory distress.   Musculoskeletal: Moves all extremities. No gross deformity.  Pelvis stable and nontender.  No tenderness to palpation over the right lateral knee.  There is mild tenderness to palpation in the right groin.  Range of motion of the right hip shows no pain with flexion, internal or external rotation.  There is pain with abduction.  There is mild tenderness to palpation of the left knee joint over the patella, medial joint space, MCL.  No LCL, lateral joint space, popliteal fossa tenderness.  Joint is stable without varus or valgus laxity, normal anterior drawer test, full range of motion noted although full flexion is painful.  There is mild tenderness to palpation over the medial right calcaneus, remainder of foot and ankle including the right first DIP joint are nontender.    Neurological: Alert and oriented to person, place, and time.  Motor and sensation grossly intact. Normal muscle tone.    Skin:  Warm and dry. No rash.  Good capillary refill noted in right foot.    Psychiatric: Normal mood and affect. Behavior is normal.    Procedures        Radiographs (if obtained):  []  The following radiograph was interpreted by myself in the absence of a radiologist:   [x]  Radiologist's Report Reviewed:  XR Hip Right Standard   Final Result   Unremarkable right hip.         XR Foot Right Standard   Preliminary Result   1. Questionable longitudinal fracture of the 1st distal DIP, only appreciated   on a single view.  No associated soft tissue swelling.  Correlate with focal   pain in this area.         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   No acute osseous abnormality.                Labs  No results found for this visit on 03/18/16.      MDM  Patient presents today with chief complaint of  right heel, right groin, left knee pain.  Physical exam was as above.  Workup included x-ray of left knee, right hip, right foot which revealed tricompartmental osteoarthritis in the right knee with a joint effusion, and a questionable longitudinal fracture of the first distal DIP joint only appreciated on one view.  This does not correlate with any focal area of pain.  Discussed all the above patient.  Recommend a few days of rest due to likely sprains and contusions.  Discussed use of Motrin and Tylenol.  Recommended cryotherapy.  I estimate there is LOW risk for COMPARTMENT SYNDROME, DEEP VENOUS THROMBOSIS, NECROTIZING FASCIITIS, CELLULITIS, MAJOR FRACTURE, OSTEOMYELITIS, SEPTIC ARTHRITIS, TENDON OR NEUROVASCULAR INJURY, thus I consider the discharge disposition reasonable. Benedetto Coons and I have discussed the diagnosis and risks, and we agree with discharging home to follow-up with their primary doctor or the referral orthopedist. We also discussed returning to the Emergency Department immediately if new or worsening symptoms occur. We have discussed the symptoms which are most concerning (e.g., changing or worsening pain, numbness, weakness, fever, new rash, discoloration, new or worsening swelling) that necessitate immediate return.      I have independently evaluated this patient.    Final Impression  1. Groin strain, right, initial encounter    2. Sprain of left knee, unspecified ligament, initial encounter    3. Contusion of right foot, initial encounter        Blood pressure 102/64, pulse 92, temperature 98 ??F (36.7 ??C), temperature source Oral, resp. rate 16, height 6' (1.829 m), weight 200 lb (90.7 kg), last menstrual period 02/19/2016, SpO2 98 %.     Disposition:  Discharge to home in stable condition.       Patient was given scripts for the following medications. I counseled patient how to take these medications.     New Prescriptions    No medications on file       @EMPNOTECCTIME @    This chart was  generated using the [employer] dictation system. I created this record but it may contain dictation errors given the limitations of this technology.       Christian Mate Dois Juarbe, PA-C  03/19/16 [phone]

## 2016-03-18 NOTE — ED Notes (Signed)
Bed: ED03  Expected date:   Expected time:   Means of arrival:   Comments:  Needs quick reg     Bradd Burner, RN  03/18/16 2306

## 2016-03-19 ENCOUNTER — Inpatient Hospital Stay: Admit: 2016-03-19 | Discharge: 2016-03-19

## 2016-03-19 NOTE — ED Notes (Signed)
Discharge reviewed. Pt voices understanding and denies any questions.      Jesus Genera, RN  03/19/16 [phone]

## 2016-03-19 NOTE — ED Notes (Signed)
Pt awaiting XR results at this time. Water provided. Denies other needs.      Jesus Genera, RN  03/19/16 [phone]

## 2016-03-25 ENCOUNTER — Other Ambulatory Visit: Payer: Self-pay | Admitting: Family Medicine

## 2016-04-02 ENCOUNTER — Telehealth: Payer: Self-pay | Admitting: Family Medicine

## 2016-04-02 NOTE — Telephone Encounter (Signed)
[name] called saying her pharmacy sent a medication refill request to our office a week ago and haven't received a response. She thinks the medication was Metronidazole for a possible yeast infection but she's not sure. She's going to call the pharmacy to ask them to send the request again but she wants a phone call regarding this to see if a refill can be sent to the pharmacy or if she needs to come in for an office visit first.  Pt's ph# [phone] Thank you.

## 2016-04-02 NOTE — Telephone Encounter (Signed)
Called and LMOM advising pt that we had responded to pharm req w/denial and note that we will need to eval her first before Rxing an Abx to make sure we are treating her with the correct medication.

## 2016-04-15 ENCOUNTER — Inpatient Hospital Stay: Admit: 2016-04-15 | Attending: Physician Assistant | Primary: Adult Medicine

## 2016-04-15 ENCOUNTER — Encounter

## 2016-04-15 ENCOUNTER — Ambulatory Visit: Admit: 2016-04-15 | Primary: Adult Medicine

## 2016-04-15 ENCOUNTER — Encounter: Admit: 2016-04-15 | Primary: Adult Medicine

## 2016-04-15 DIAGNOSIS — S86912A Strain of unspecified muscle(s) and tendon(s) at lower leg level, left leg, initial encounter: Secondary | ICD-10-CM

## 2016-05-06 ENCOUNTER — Inpatient Hospital Stay: Primary: Adult Medicine

## 2016-05-06 NOTE — Other (Signed)
Physical Therapy Daily Treatment Note  Date:  05/06/2016    Patient Name:  Regina Potter    DOB:  19-Aug-1976  MRN: [id number]  Restrictions/Precautions:  Must Wear Walking Boot for [employer] Activities  Diagnosis: Rt Ankle/foot strain of unspecified muscle & tendon  Treatment Diagnosis: Muscular weakness, pain, difficulty walking, decreased function  Insurance/Certification information:  [employer]WC, auth 2 x 6 = 12 visits, dates 04/29/16 to 06/16/16  Next Physician Visit: 06/02/2016  Referring Physician:Kevin Adela Ports, MD    POC EXPIRATION DATE  Visit# / total visits: 1 / 12                 Initial Pain level: 4/10     Subjective: Pt was chasing a person and stepped in a hole she had pain in the achilles tendon and fell forward onto the ground. She now has pain in the lateral achilles tendon area at the calcaneus. 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 Pt is in a walking boot and rating her pain at 4/10 currently. The more she is up an moving the worse her pain gets and reaches over 10/10 especially before she was placed in the boot. Pt has been in the boot since 05/02/2016. Pt does stay busy outside of work and plays semi-professional women's football.  Pt had MRI  with results showing a Right calcaneal fracture and longitudinal split tear tendon Right Peroneus Brevis. Returns to Surgeon on 06/02/2016.   Any changes to Ambulatory Summary Sheet?no             Goals:   Long term goals  Time Frame for Long term goals : All goals to be assessed by the 10th visit  Long term goal 1: Pt to be independent with HEP   Long term goal 2: Pt to increase right ankle AROM  Long term goal 3: Pt to increase right ankle strength  Long term goal 4: Pt to  improve score of LEFS  Patient's goal:  Exercise/Equipment/Modalities Date 05/06/2016 (1) Date Date Date     Outcome measure used  LEFS        On visit#1 29 Points/36.3% (63.7% disabled)                Nu-Step  Next        Ankle pumps X 10 reps        Ankle Circles CW/CCW X 10 ea        Seated calf stretch Next                                                                                   Progress/goals assessment (every 10 visits) by  PT           HEP: As above      Objective   findings: See Eval  Manual treatments/provider interaction:  STM/TPR to right gastroc with multiple TP's in the gastroc lat > med      Response to intervention:   Pt felt better in the gastroc after a bit      Plan for Next Session: Plan to advance exercises for ankle ROM and strengthening and add them as pt tolerates        Modality/intervention used:  [x]  Therapeutic Exercise  []  Modalities:  []  Therapeutic Activity     []  Ultrasound  []  Elec  Stim  []  Gait Training      []  Cervical Traction []  Lumbar Traction  []  Neuromuscular Re-education    []  Cold/hotpack []  Iontophoresis   [x]  Instruction in HEP      []  Vasopneumatic     [x]  Manual Therapy               [x]  Self care home management                    (    ) Dry needling    Post Tx Pain Rating:    Plan:(Fequency/duration/# of visits)   [x]  Continue per plan of care []  Alter current plan   [x]  Plan of care initiated []  Hold pending MD visit []  Discharge         Time In:1105  Time Out:1200  Timed Code Treatment Minutes: 30   Total Treatment Minutes:55      Electronically signed by:  Cletis Media 05/06/2016, 7:50 PM

## 2016-05-06 NOTE — Plan of Care (Signed)
Outpatient Physical Therapy        [x]  Phone: [phone]   Fax: [phone]   Pediatric Therapy          []  Phone: [phone]   Fax: [phone]  Pediatric [place]          []  Phone: [phone]   Fax: [phone]      To: Referring Practitioner: Lorin [place], MD  From: Neomia Glass, PT     Patient: Regina Potter       DOB: 1976/06/22  Diagnosis: Rt Ankle/foot strain of unspecified muscle & tendon   Treatment Diagnosis: Muscular weakness, pain, difficulty walking, decreased function   Date: 05/06/2016    Physical Therapy Certification/Re-Certification Form  Dear Regina Moats, MD  The following patient has been evaluated for physical therapy services and for therapy to continue, Please review the attached evaluation and/or summary of the patient's plan of care, and verify that you agree therapy should continue by signing the attached document and sending it back to our office.    Assessment:Patient is a 40 yo Female who presents with Right Ankle/foot strain of unspecified muscle & tendon  which impacts on endurance, gait, [place] activities, work;patient's goal is to resolve pain, return to normal daily activities including work and extracurricular activities (plays sports) ;patient reports that Right Ankle/foot strain of unspecified muscle & tendon limits activities including walking, running, standing and other activities that would be involved in work and extra curricular activities; PT to address patient's goals, impairments and activity limitations with skilled interventions checked in plan of care;patient's level of function prior to onset of Right Ankle/foot strain of unspecified muscle & tendon was WNL; did not observe any barriers to learning during PT eval; learning preferences include demonstration, practice, and handouts; patient expressed understanding of HEP; patient appears to be motivated to participate in an active PT program and to be compliant with HEP expectations;patient assisted  in developing treatment plan and goals; DME is currently being used = Walking boot      Plan of Care/Treatment to date:  [x]  Therapeutic Exercise    [x]  Modalities:  []  Therapeutic Activity     []  Ultrasound  []  Elec Stimulation  [x]  Gait Training      []  Cervical Traction []  Lumbar Traction  [x]  Neuromuscular Re-education    [x]  Cold/hotpack []  Iontophoresis   [x]  Instruction in HEP     Other:  [x]  Manual Therapy      [x]  vasopneumatic            [x]  Self care home management         [] Dry needling trigger point point/pain management    ?      Frequency/Duration:  # Days per week: []  1 day # Weeks: []  1 week []  5 weeks     [x]  2 days?   []  2 weeks [x]  6 weeks     []  3 days   []  3 weeks []  7 weeks     []  4 days   []  4 weeks []  8 weeks    Rehab Potential/Progress: []  Excellent []  Good [x]  Fair  []  Poor     Goals:   Long term goals  Time Frame for Long term goals : All goals to be assessed by the 10th visit  Long term goal 1: Pt to be independent with HEP   Long term goal 2: Pt to increase right ankle AROM  Long term goal 3: Pt to increase right ankle strength  Long term goal 4: Pt to improve score of LEFS  Electronically signed by:  Neomia Glass, PT, 05/06/2016, 7:51 PM              If you have any questions or concerns, please don't hesitate to call.  Thank you for your referral.      Physician Signature:_________________Date:____________Time: ________  By signing above, therapist???s plan is approved by physician

## 2016-05-06 NOTE — Progress Notes (Signed)
Physical Therapy  Initial Assessment  Date: 05/06/2016  Patient Name: Regina Potter  MRN: [id number]  DOB: 06-16-1976     Treatment Diagnosis: Muscular weakness, pain, difficulty walking, decreased function    Restrictions   Pt in walking boot currently when up and [place]    Subjective   General  Chart Reviewed: Yes  Patient assessed for rehabilitation services?: Yes  Family / Caregiver Present: No  Referring Practitioner: Lorin Longtown, MD  Referral Date : 04/29/16  Diagnosis: Rt Ankle/foot strain of unspecified muscle & tendon  Follows Commands: Within Functional Limits  PT Visit Information  Onset Date: 03/18/16  PT Insurance Information: BWC  Subjective  Subjective: Pt was chasing a person and stepped in a hole she had pain in the achilles tendon and fell forward onto the ground. She now has pain in the lateral achilles tendon area at the calcaneus. 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 Pt is in a walking boot and rating her pain at 4/10 currently. The more she is up an moving the worse her pain gets and reaches over 10/10 especially before she was placed in the boot. Pt has been in the boot since 05/02/2016. Pt does stay busy outside of work and plays semi-professional women's football.  Pt had MRI  with results showing a Right calcaneal fracture and longitudinal split tear tendon Right Peroneus Brevis.   Pain Screening  Patient Currently in Pain: Yes  Pain Assessment  Pain Assessment: 0-10  Pain Level: 4  Pain Type: Acute pain  Pain Location: Ankle;Foot  Pain Orientation: Right  Pain Descriptors: Throbbing;Sharp;Burning;Pressure  Pain Frequency: Continuous  Pain Intervention(s): Medication (see eMar);Cold applied  (Motrin)  Vital Signs  Patient Currently in Pain: Yes    Vision/Hearing  Vision  Vision: Within Functional Limits  Hearing  Hearing: Within functional limits    Orientation  Orientation  Overall Orientation Status: Within Normal Limits    Social/Functional History  Social/Functional History  Lives With: Alone  Type of Home: House  Home Layout: One level  ADL Assistance: Independent  Homemaking Assistance: Independent  Ambulation Assistance: Independent  Active Driver: Yes  Mode of Transportation: Car  Occupation: Full time employment  Type of occupation: Librarian, 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    Objective     Observation/Palpation  Posture: Good  Palpation: Tenderness noted in the posterior calcaneus, posterior and inferior lateral malleolus, gastroc and achilles tendon  Edema: Edema noted in the entire posterior ankle, worse lateral; mild in anterior talus region    AROM RLE (degrees)  RLE General AROM: Ankle DF 2 PF 69 Inv 42 Ever 11  AROM LLE (degrees)  LLE General AROM: Ankle DF 8 PF 75 Inv 31 Ever 20    Assessment  Patient is a 40 yo Female who presents with Right Ankle/foot strain of unspecified muscle & tendon  which impacts on endurance, gait, [place] activities, work;patient's goal is to resolve pain, return to normal daily activities including work and extracurricular activities (plays sports) ;patient reports that Right Ankle/foot strain of unspecified muscle & tendon limits activities including walking, running, standing and other activities that would be involved in work and  extra curricular activities; PT to address patient's goals, impairments and activity limitations with skilled interventions checked in plan of care;patient's level of function prior to onset of Right Ankle/foot strain of unspecified muscle & tendon was WNL; did not observe any barriers to learning during PT eval; learning preferences include demonstration, practice, and handouts; patient expressed understanding of HEP; patient appears  to be motivated to participate in an active PT program and to be compliant with HEP expectations;patient assisted in developing treatment plan and goals; DME is currently being used = Walking boot;      Current functional level (based on LEFS)   :29 Points/36.3% (63.7% disabled)    Conditions Requiring Skilled Therapeutic Intervention  Treatment Diagnosis: Muscular weakness, pain, difficulty walking, decreased function  Prognosis: Fair  Decision Making: Low Complexity  History: unremarkable  Exam: ROM, strength, palpation, LEFS  Clinical Presentation: evolving  Patient Education: HEP, Ice and heat  Barriers to Learning: None noted  REQUIRES PT FOLLOW UP: Yes  Treatment Initiated : Yes         Plan   Plan  Times per week: 2x/week  Times per day: Daily  Plan weeks: 6 weeks  Current Treatment Recommendations: Strengthening, ROM, Hotel manager, Engineer, production, Personnel officer, Neuromuscular Re-education, Pain Management, Home Exercise Program, Safety Education & Training, Modalities, Patient/Caregiver Education & Training, Manual Therapy - Soft Tissue Mobilization    G-Code  PT G-Codes  Functional Assessment Tool Used: LEFS  Score: 29 Points/36.3% (63.7% disabled)  Functional Limitation: Mobility: Walking and moving around  Mobility: Walking and Moving Around Current Status [id number]): At least 60 percent but less than 80 percent impaired, limited or restricted  Mobility: Walking and Moving Around Goal Status [phone]): At least 20 percent but less than 40 percent impaired, limited or restricted    Goals  Long term goals  Time Frame for Long term goals : All goals to be assessed by the 10th visit  Long term goal 1: Pt to be independent with HEP   Long term goal 2: Pt to increase right ankle AROM  Long term goal 3: Pt to increase right ankle strength  Long term goal 4: Pt to improve score of LEFS       Therapy Time   Individual Concurrent Group Co-treatment   Time In 1105         Time Out 1200         Minutes 55          Timed Code Treatment Minutes: 30 Minutes       Neomia Glass, PT

## 2016-05-08 ENCOUNTER — Inpatient Hospital Stay: Primary: Adult Medicine

## 2016-05-08 NOTE — Other (Signed)
Physical Therapy Daily Treatment Note  Date:  05/08/2016    Patient Name:  Regina Potter    DOB:  Dec 10, 1975  MRN: [id number]  Restrictions/Precautions:  Must Wear Walking Boot for [employer] Activities  Diagnosis: Rt Ankle/foot strain of unspecified muscle & tendon  Treatment Diagnosis: Muscular weakness, pain, difficulty walking, decreased function  Insurance/Certification information:  BWC, auth 2 x 6 = 12 visits, dates 04/29/16 to 06/16/16  Next Physician Visit: 06/02/2016  Referring Physician:Kevin Adela Ports, MD    POC EXPIRATION DATE  Visit# / total visits: 2 / 12                 Initial Pain level: 3/10     Subjective: pt reports decreased pain today.  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), though pt states "the longer I'm on my feet the more it hurts, but I just suck it up"   PT may not be fully aware of severity of injury, pt walking short distances without boot           Goals:   Long term goals  Time Frame for Long term goals : All goals to be assessed by the 10th visit  Long term goal 1: Pt to be independent with HEP   Long term goal 2: Pt to increase right ankle AROM  Long term goal 3: Pt to increase right ankle strength  Long term goal 4: Pt to improve score of LEFS  Patient's goal:  Exercise/Equipment/Modalities Date 05/06/2016 (1) Date 05/08/16 #2 Date Date     Outcome measure used  LEFS        On visit#1 29 Points/36.3% (63.7% disabled)                Nu-Step  Next 5 min lv1       Ankle pumps X 10 reps x20       Ankle Circles CW/CCW X 10 ea x20 reps CW/CCW       Seated calf stretch Next  5 reps 10 ct       seated toe crunches with towell  2x10 reps                Vaso R ankle   x15 min                                                       Progress/goals assessment (every 10 visits) by  PT           HEP: As above      Objective   findings: See Eval      Manual treatments/provider interaction:  STM/TPR to right gastroc with multiple TP's in the gastroc lat > med ot instr in R foot toe  crunches for ankle str and rehab progresson     Response to intervention:   Pt felt better in the gastroc after a bit      Plan for Next Session: Plan to advance exercises for ankle ROM and strengthening and add them as pt tolerates Cont Pt progress ex as tolerated.  Pt appears very a tough individual and my require some restraint  for safety and to prevent over doing ex       Modality/intervention used:  [x]  Therapeutic Exercise  []  Modalities:  []  Therapeutic Activity     []   Ultrasound  []  Elec  Stim  []  Gait Training      []  Cervical Traction []  Lumbar Traction  []  Neuromuscular Re-education    []  Cold/hotpack []  Iontophoresis   [x]  Instruction in HEP      []  Vasopneumatic     [x]  Manual Therapy               [x]  Self care home management                    (    ) Dry needling    Post Tx Pain Rating:    Plan:(Fequency/duration/# of visits)   [x]  Continue per plan of care []  Alter current plan   [x]  Plan of care initiated []  Hold pending MD visit []  Discharge         Time In:1030  Time Out:1110  Timed Code Treatment Minutes:25   Total Treatment Minutes:40      Electronically signed by:  Burr Medico 05/08/2016, 9:07 AM

## 2016-05-13 ENCOUNTER — Inpatient Hospital Stay: Primary: Adult Medicine

## 2016-05-13 NOTE — Other (Signed)
Physical Therapy Daily Treatment Note  Date:  05/13/2016    Patient Name:  Regina Potter    DOB:  Mar 15, 1976  MRN: [id number]  Restrictions/Precautions:  Must Wear Walking Boot for [employer] Activities  Diagnosis: Rt Ankle/foot strain of unspecified muscle & tendon  Treatment Diagnosis: Muscular weakness, pain, difficulty walking, decreased function  Insurance/Certification information:  BWC, auth 2 x 6 = 12 visits, dates 04/29/16 to 06/16/16  Next Physician Visit: 06/02/2016  Referring Physician:Kevin Adela Ports, MD    POC EXPIRATION DATE  Visit# / total visits: 3/ 12                 Initial Pain level: 4/10     Subjective: Pt reports doing pretty good today.         Goals:   Long term goals  Time Frame for Long term goals : All goals to be assessed by the 10th visit  Long term goal 1: Pt to be independent with HEP   Long term goal 2: Pt to increase right ankle AROM  Long term goal 3: Pt to increase right ankle strength  Long term goal 4: Pt to improve score of LEFS  Patient's goal:  Exercise/Equipment/Modalities Date 05/06/2016 (1) Date 05/08/16 #2 Date 05/13/2016 (3) Date     Outcome measure used  LEFS        On visit#1 29 Points/36.3% (63.7% disabled)                Nu-Step  Next 5 min lv1 S10/A11 X 8 Min, Lv2      Ankle pumps X 10 reps x20 x20      Ankle Circles CW/CCW X 10 ea x20 reps CW/CCW x20 reps CW/CCW      Seated calf stretch Next  5 reps 10 ct  5 reps 10 ct      seated toe crunches with towell  2x10 reps 2x10 reps      Ankle DF/PF/Inv/Ever   X 10 ea, Red TB                    Vaso R ankle   x15 min X 15 Min                                                      Progress/goals assessment (every 10 visits) by  PT           HEP: As above  Added theraband resisted Ankle DF/PF/inv/ever    Objective   findings: See Eval  Pt with good recall of exercises.    Manual treatments/provider interaction:  STM/TPR to right gastroc with multiple TP's in the gastroc lat > med ot instr in R foot toe crunches for ankle str and  rehab progresson Monitoring to ensure safe and effective activity performance    Response to intervention:   Pt felt better in the gastroc after a bit  Pt with some pain during strengthening    Plan for Next Session: Plan to advance exercises for ankle ROM and strengthening and add them as pt tolerates Cont Pt progress ex as tolerated.  Pt appears very a tough individual and my require some restraint  for safety and to prevent over doing ex Cont to advance ex as pt tolerates for ROM and strengthening      Modality/intervention  used:  [x]  Therapeutic Exercise  []  Modalities:  []  Therapeutic Activity     []  Ultrasound  []  Elec  Stim  []  Gait Training      []  Cervical Traction []  Lumbar Traction  []  Neuromuscular Re-education    []  Cold/hotpack []  Iontophoresis   [x]  Instruction in HEP      []  Vasopneumatic     [x]  Manual Therapy               [x]  Self care home management                    (    ) Dry needling    Post Tx Pain Rating: better    Plan:(Fequency/duration/# of visits)   [x]  Continue per plan of care []  Alter current plan   []  Plan of care initiated []  Hold pending MD visit []  Discharge         Time In:1300  Time Out:1345  Timed Code Treatment Minutes: 30  Total Treatment Minutes: 45     Electronically signed by:  Cletis Media 05/13/2016, 12:58 PM

## 2016-05-15 ENCOUNTER — Inpatient Hospital Stay: Primary: Adult Medicine

## 2016-05-15 NOTE — Other (Signed)
Physical Therapy Daily Treatment Note  Date:  05/15/2016    Patient Name:  Regina Potter    DOB:  [birth year]  MRN: [id number]  Restrictions/Precautions:  Must Wear Walking Boot for [employer] Activities  Diagnosis: Rt Ankle/foot strain of unspecified muscle & tendon  Treatment Diagnosis: Muscular weakness, pain, difficulty walking, decreased function  Insurance/Certification information:  BWC, auth 2 x 6 = 12 visits, dates 04/29/16 to 06/16/16  Next Physician Visit: 06/02/2016  Referring Physician:Kevin Adela Ports, MD    POC EXPIRATION DATE  Visit# / total visits: 3 / 12                 Initial Pain level: 6/10     Subjective: pt reports having increased pain and soreness for "last few days", pt denies having any excess of activity lately that might have caused increased soreness           Goals:   Long term goals  Time Frame for Long term goals : All goals to be assessed by the 10th visit  Long term goal 1: Pt to be independent with HEP   Long term goal 2: Pt to increase right ankle AROM  Long term goal 3: Pt to increase right ankle strength  Long term goal 4: Pt to improve score of LEFS  Patient's goal:  Exercise/Equipment/Modalities Date 05/06/2016 (1) Date 05/08/16 #2 Date 05/15/16 #3 Date     Outcome measure used  LEFS        On visit#1 29 Points/36.3% (63.7% disabled)                Nu-Step  Next 5 min lv1 7 min lv2      Ankle pumps X 10 reps x20 x20      Ankle Circles CW/CCW X 10 ea x20 reps CW/CCW x20      Seated calf stretch Next  5 reps 10 ct 2x5 reps       seated toe crunches with towell  2x10 reps 2x1 min    Res ankle PF/ DF/EV with RTB   x20 reps               Vaso R ankle   x15 min x15 min                                                      Progress/goals assessment (every 10 visits) by  PT           HEP: As above      Objective   findings: See Eval      Manual treatments/provider interaction:  STM/TPR to right gastroc with multiple TP's in the gastroc lat > med ot instr in R foot toe crunches for ankle str  and rehab progresson reviewed ankle resisted ex with RTB    Response to intervention:   Pt felt better in the gastroc after a bit  Pt tol treatment fair,pt reports some level of pain with all of her exercises but says she can tolerated it.     Plan for Next Session: Plan to advance exercises for ankle ROM and strengthening and add them as pt tolerates Cont Pt progress ex as tolerated.  Pt appears a very tough individual and my require some restraint  for safety and to prevent over doing ex Cont Pt progress ex  as tolerated.      Modality/intervention used:  [x]  Therapeutic Exercise  []  Modalities:  []  Therapeutic Activity     []  Ultrasound  []  Elec  Stim  []  Gait Training      []  Cervical Traction []  Lumbar Traction  []  Neuromuscular Re-education    []  Cold/hotpack []  Iontophoresis   [x]  Instruction in HEP      []  Vasopneumatic     [x]  Manual Therapy               [x]  Self care home management                    (    ) Dry needling    Post Tx Pain Rating: 8/10 pt reported increased pain after treatment.  Ex caused increased pain and VASO worked but not enough to bring pain level back down.    Plan:(Fequency/duration/# of visits)   [x]  Continue per plan of care []  Alter current plan   [x]  Plan of care initiated []  Hold pending MD visit []  Discharge         Time In:1115  Time Out:1200  Timed Code Treatment Minutes:30   Total Treatment Minutes:45     Electronically signed by:  Burr Medico 05/15/2016, 11:02 AM

## 2016-05-20 ENCOUNTER — Encounter: Primary: Adult Medicine

## 2016-05-22 ENCOUNTER — Inpatient Hospital Stay: Admit: 2016-05-22 | Primary: Adult Medicine

## 2016-05-22 NOTE — Other (Signed)
Physical Therapy Daily Treatment Note  Date:  05/22/2016    Patient Name:  Regina Potter    DOB:  1976-03-24  MRN: [id number]  Restrictions/Precautions:  Must Wear Walking Boot for [employer] Activities  Diagnosis: Rt Ankle/foot strain of unspecified muscle & tendon  Treatment Diagnosis: Muscular weakness, pain, difficulty walking, decreased function  Insurance/Certification information:  BWC, auth 2 x 6 = 12 visits, dates 04/29/16 to 06/16/16  Next Physician Visit: 06/02/2016  Referring Physician:Kevin Adela Ports, MD    POC EXPIRATION DATE  Visit# / total visits: 4 / 12                 Initial Pain level: 4/10 R ankle     Subjective: Pt states she tried to use the frozen water bottle over the weekend and it was too uncomfortable. The area is still reported to be hypersensitive.           Goals:   Long term goals  Time Frame for Long term goals : All goals to be assessed by the 10th visit  Long term goal 1: Pt to be independent with HEP   Long term goal 2: Pt to increase right ankle AROM  Long term goal 3: Pt to increase right ankle strength  Long term goal 4: Pt to improve score of LEFS  Patient's goal:  Exercise/Equipment/Modalities Date 05/06/2016 (1) Date 05/08/16 #2 Date 05/15/16 #3 Date  05/22/16 #4     Outcome measure used  LEFS        On visit#1 29 Points/36.3% (63.7% disabled)                Nu-Step  Next 5 min lv1 7 min lv2 LV 2 x 10 min     Ankle pumps X 10 reps x20 x20      Ankle Circles CW/CCW X 10 ea x20 reps CW/CCW x20      Seated calf stretch Next  5 reps 10 ct 2x5 reps       seated toe crunches with towell  2x10 reps 2x1 min    Res ankle PF/ DF/EV with RTB   x20 reps X 20 reps ea              Vaso R ankle   x15 min x15 min Not done today                                                     Progress/goals assessment (every 10 visits) by  PT           HEP: As above   cont   Objective   findings: See Eval   Significant TP's in the R calf   Manual treatments/provider interaction:  STM/TPR to right gastroc with  multiple TP's in the gastroc lat > med ot instr in R foot toe crunches for ankle str and rehab progresson reviewed ankle resisted ex with RTB Ed pt on desensitization     STM/NMT/SCS to the R calf x    Response to intervention:   Pt felt better in the gastroc after a bit  Pt tol treatment fair,pt reports some level of pain with all of her exercises but says she can tolerated it.  Less calf pain and foot pain   Plan for Next Session: Plan to advance exercises for ankle ROM  and strengthening and add them as pt tolerates Cont Pt progress ex as tolerated.  Pt appears a very tough individual and my require some restraint  for safety and to prevent over doing ex Cont Pt progress ex as tolerated. Cont Pt progress ex as tolerated.     Modality/intervention used:  [x]  Therapeutic Exercise  []  Modalities:  []  Therapeutic Activity     []  Ultrasound  []  Elec  Stim  []  Gait Training      []  Cervical Traction []  Lumbar Traction  []  Neuromuscular Re-education    []  Cold/hotpack []  Iontophoresis   [x]  Instruction in HEP      []  Vasopneumatic     [x]  Manual Therapy               [x]  Self care home management                    (    ) Dry needling    Post Tx Pain Rating: 3-4/10 foot    Plan:(Fequency/duration/# of visits)   [x]  Continue per plan of care []  Alter current plan   [x]  Plan of care initiated []  Hold pending MD visit []  Discharge         Time In: 1036  Time Out:1117  Timed Code Treatment Minutes:41  Total Treatment Minutes:41    Electronically signed by:  Pecola Leisure 05/22/2016, 10:41 AM

## 2016-05-27 ENCOUNTER — Inpatient Hospital Stay: Primary: Adult Medicine

## 2016-05-27 NOTE — Other (Addendum)
Physical Therapy Daily Treatment Note  Date:  05/27/2016    Patient Name:  Regina Potter    DOB:  08-Dec-1975  MRN: [id number]  Restrictions/Precautions:  Must Wear Walking Boot for [employer] Activities  Diagnosis: Rt Ankle/foot strain of unspecified muscle & tendon  Treatment Diagnosis: Muscular weakness, pain, difficulty walking, decreased function  Insurance/Certification information:  BWC, auth 2 x 6 = 12 visits, dates 04/29/16 to 06/16/16  Next Physician Visit: 06/02/2016  Referring Physician:Kevin Adela Ports, MD    POC EXPIRATION DATE  Visit# / total visits: 5/ 12                 Initial Pain level: 4/10 R ankle     Subjective: Pt states she tried to use the frozen water bottle over the weekend and it was too uncomfortable. The area is still reported to be hypersensitive.           Goals:   Long term goals  Time Frame for Long term goals : All goals to be assessed by the 10th visit  Long term goal 1: Pt to be independent with HEP   Long term goal 2: Pt to increase right ankle AROM  Long term goal 3: Pt to increase right ankle strength  Long term goal 4: Pt to improve score of LEFS  Patient's goal:  Exercise/Equipment/Modalities Date 05/06/2016 (1) Date 05/08/16 #2 Date 05/15/16 #3 Date  05/22/16 #4 05/27/2016 (5)     Outcome measure used  LEFS        On visit#1 29 Points/36.3% (63.7% disabled)                  Nu-Step  Next 5 min lv1 7 min lv2 LV 2 x 10 min Sci-Fit S14 x 12 Min     Ankle pumps X 10 reps x20 x20  x20     Ankle Circles CW/CCW X 10 ea x20 reps CW/CCW x20  x20     Seated calf stretch Next  5 reps 10 ct 2x5 reps   2x5 reps      seated toe crunches with towell  2x10 reps 2x1 min  2x1 min   Res ankle PF/ DF/EV with RTB   x20 reps X 20 reps ea X 20 reps ea     BAPS     X 20 LV1     Vaso R ankle   x15 min x15 min Not done today                                                           Progress/goals assessment (every 10 visits) by  PT            HEP: As above   cont Cont as above, has pain with most ex    Objective   findings: See Eval   Significant TP's in the R calf    Manual treatments/provider interaction:  STM/TPR to right gastroc with multiple TP's in the gastroc lat > med ot instr in R foot toe crunches for ankle str and rehab progresson reviewed ankle resisted ex with RTB Ed pt on desensitization     STM/NMT/SCS to the R calf x  Monitoring to ensure safe and effective activity performance   Response to intervention:   Pt felt better in  the gastroc after a bit  Pt tol treatment fair,pt reports some level of pain with all of her exercises but says she can tolerated it.  Less calf pain and foot pain Pt with pain during all exercises.   Plan for Next Session: Plan to advance exercises for ankle ROM and strengthening and add them as pt tolerates Cont Pt progress ex as tolerated.  Pt appears a very tough individual and my require some restraint  for safety and to prevent over doing ex Cont Pt progress ex as tolerated. Cont Pt progress ex as tolerated. Cont to progress as tolerated.     Modality/intervention used:  [x]  Therapeutic Exercise  []  Modalities:  []  Therapeutic Activity     []  Ultrasound  []  Elec  Stim  []  Gait Training      []  Cervical Traction []  Lumbar Traction  []  Neuromuscular Re-education    []  Cold/hotpack []  Iontophoresis   [x]  Instruction in HEP      []  Vasopneumatic     [x]  Manual Therapy               [x]  Self care home management                    (    ) Dry needling    Post Tx Pain Rating: 3-4/10 foot    Plan:(Fequency/duration/# of visits)   [x]  Continue per plan of care []  Alter current plan   []  Plan of care initiated []  Hold pending MD visit []  Discharge         Time In: 1300  Time Out:1339  Timed Code Treatment Minutes:39  Total Treatment Minutes:39    Electronically signed by:  Cletis Media 05/27/2016, 1:03 PM

## 2016-05-29 ENCOUNTER — Inpatient Hospital Stay: Primary: Adult Medicine

## 2016-05-29 ENCOUNTER — Encounter: Primary: Adult Medicine

## 2016-05-29 NOTE — Other (Signed)
Physical Therapy Daily Treatment Note  Date:  05/29/2016    Patient Name:  Regina Potter    DOB:  [birth year]  MRN: [id number]  Restrictions/Precautions:  Must Wear Walking Boot for [employer] Activities  Diagnosis: Rt Ankle/foot strain of unspecified muscle & tendon  Treatment Diagnosis: Muscular weakness, pain, difficulty walking, decreased function  Insurance/Certification information:  BWC, auth 2 x 6 = 12 visits, dates 04/29/16 to 06/16/16  Next Physician Visit: 06/02/2016  Referring Physician:Kevin Adela Ports, MD    POC EXPIRATION DATE  Visit# / total visits: 6/ 12                 Initial Pain level: 5-6/10 R ankle     Subjective: Pt reports having sharp pains under the heal yesterday. Pt feels like she is not making much progress. The more she is up walking the worse it is.           Goals:   Long term goals  Time Frame for Long term goals : All goals to be assessed by the 10th visit  Long term goal 1: Pt to be independent with HEP   Long term goal 2: Pt to increase right ankle AROM  Long term goal 3: Pt to increase right ankle strength  Long term goal 4: Pt to improve score of LEFS  Patient's goal:  Exercise/Equipment/Modalities Date 05/06/2016 (1) Date 05/08/16 #2 Date 05/15/16 #3 Date  05/22/16 #4 05/27/2016 (5) 05/29/2016 (6)     Outcome measure used  LEFS        On visit#1 29 Points/36.3% (63.7% disabled)                    Nu-Step  Next 5 min lv1 7 min lv2 LV 2 x 10 min Sci-Fit S14 x 12 Min S10, no arms, LV3 x 10 Min     Ankle pumps X 10 reps x20 x20  x20 X 20 reps     Ankle Circles CW/CCW X 10 ea x20 reps CW/CCW x20  x20 X 20 reps     Seated calf stretch Next  5 reps 10 ct 2x5 reps   2x5 reps  2x5 reps     seated toe crunches with towell  2x10 reps 2x1 min  2x1 min    Res ankle PF/ DF/EV with RTB   x20 reps X 20 reps ea X 20 reps ea RTB x 20 each     BAPS     X 20 LV1      Vaso R ankle   x15 min x15 min Not done today  X 15 Min                                                               Progress/goals assessment  (every 10 visits) by  PT             HEP: As above   cont Cont as above, has pain with most ex Cont as above, pt has pain with all ex.   Objective   findings: See Eval   Significant TP's in the R calf  Significant TP's in the R gastroc   Manual treatments/provider interaction:  STM/TPR to right gastroc with multiple TP's in the gastroc lat > med ot instr  in R foot toe crunches for ankle str and rehab progresson reviewed ankle resisted ex with RTB Ed pt on desensitization     STM/NMT/SCS to the R calf x  Monitoring to ensure safe and effective activity performance STM/TPR to the right calf.  Monitoring to ensure safe and effective activity performance   Response to intervention:   Pt felt better in the gastroc after a bit  Pt tol treatment fair,pt reports some level of pain with all of her exercises but says she can tolerated it.  Less calf pain and foot pain Pt with pain during all exercises. Pt only gets relief with ice, but has continued pain constantly. It worsens with increased standing/walking. Pain after ice 4/10.   Plan for Next Session: Plan to advance exercises for ankle ROM and strengthening and add them as pt tolerates Cont Pt progress ex as tolerated.  Pt appears a very tough individual and my require some restraint  for safety and to prevent over doing ex Cont Pt progress ex as tolerated. Cont Pt progress ex as tolerated. Cont to progress as tolerated. Cont to progress as per tolerated and pt has follow up on 9/18.      Modality/intervention used:  [x]  Therapeutic Exercise  []  Modalities:  []  Therapeutic Activity     []  Ultrasound  []  Elec  Stim  []  Gait Training      []  Cervical Traction []  Lumbar Traction  []  Neuromuscular Re-education    []  Cold/hotpack []  Iontophoresis   [x]  Instruction in HEP      []  Vasopneumatic     [x]  Manual Therapy               [x]  Self care home management                    (    ) Dry needling    Post Tx Pain Rating: 4/10 foot    Plan:(Fequency/duration/# of visits)   [x]   Continue per plan of care []  Alter current plan   []  Plan of care initiated []  Hold pending MD visit []  Discharge         Time In: 1317  Time Out:1421  Timed Code Treatment Minutes: 49  Total Treatment Minutes: 64    Electronically signed by:  Cletis Media 05/29/2016, 1:17 PM

## 2016-06-03 ENCOUNTER — Emergency Department (HOSPITAL_COMMUNITY)
Admission: EM | Admit: 2016-06-03 | Discharge: 2016-06-03 | Disposition: A | Discharge status: Home / Self Care | Payer: Managed Care, Other (non HMO) | Attending: Emergency Medicine | Admitting: Emergency Medicine

## 2016-06-03 ENCOUNTER — Encounter (HOSPITAL_COMMUNITY): Payer: Self-pay | Admitting: Emergency Medicine

## 2016-06-03 DIAGNOSIS — I10 Essential (primary) hypertension: Secondary | ICD-10-CM | POA: Insufficient documentation

## 2016-06-03 DIAGNOSIS — Z79899 Other long term (current) drug therapy: Secondary | ICD-10-CM | POA: Diagnosis not present

## 2016-06-03 DIAGNOSIS — J020 Streptococcal pharyngitis: Secondary | ICD-10-CM | POA: Insufficient documentation

## 2016-06-03 DIAGNOSIS — J029 Acute pharyngitis, unspecified: Secondary | ICD-10-CM | POA: Diagnosis present

## 2016-06-03 LAB — RAPID STREP SCREEN (MED CTR MEBANE ONLY): STREPTOCOCCUS, GROUP A SCREEN (DIRECT): POSITIVE — AB

## 2016-06-03 MED ORDER — ACETAMINOPHEN 325 MG PO TABS
650.0000 mg | ORAL_TABLET | Freq: Once | ORAL | Status: AC
  Administered 2016-06-03: 650 mg via ORAL
  Filled 2016-06-03: qty 2

## 2016-06-03 MED ORDER — PENICILLIN V POTASSIUM 500 MG PO TABS
500.0000 mg | ORAL_TABLET | Freq: Once | ORAL | Status: AC
  Administered 2016-06-03: 500 mg via ORAL
  Filled 2016-06-03: qty 1

## 2016-06-03 MED ORDER — PENICILLIN V POTASSIUM 500 MG PO TABS: 500.0000 mg | ORAL_TABLET | Freq: Two times a day (BID) | ORAL | 0 refills | Status: AC

## 2016-06-03 MED ORDER — DEXAMETHASONE 6 MG PO TABS
10.0000 mg | ORAL_TABLET | Freq: Once | ORAL | Status: AC
  Administered 2016-06-03: 10 mg via ORAL
  Filled 2016-06-03: qty 1

## 2016-06-03 NOTE — Progress Notes (Signed)
Pt c/o chills and a really bad sore throat. No drooling noted.

## 2016-06-03 NOTE — Discharge Instructions (Signed)
Medications: Penicillin  Treatment: Take penicillin twice daily as prescribed for 10 days. Make sure to take all of this medication. Take Tylenol as prescribed over-the-counter for your pain and fever. Make sure to change your toothbrush after you have been taking your antibiotics for 1 week. Do not share drinks or food with anyone until you finish your antibiotics.  Follow-up: Please follow-up with your primary care provider as needed. Please return to emergency department if you develop any new or worsening symptoms, including asymmetry in your throat, one side of your throat hurts more than the other, drooling, inability to open your mouth, or any other new or concerning symptoms.

## 2016-06-03 NOTE — ED Triage Notes (Signed)
Pt c/o fever, chills, anorexia, sore throat, painful swallowing, generalized body aches, headache onset Sunday night. No cough, no emesis, SOB.

## 2016-06-03 NOTE — ED Provider Notes (Signed)
[facility] DEPT Provider Note   CSN: [id number] Arrival date & time: 06/03/16  1726   By signing my name below, I, Estanislado Pandy, attest that this documentation has been prepared under the direction and in the presence of Eliezer Mccoy, PA-C. Electronically Signed: Estanislado Pandy, Scribe. 06/03/2016. 6:11 PM.   History   Chief Complaint No chief complaint on file.   The history is provided by the patient. No language interpreter was used.   HPI Comments:  Pamela Allen is a 40 y.o. female who presents to the Emergency Department with multiple complaints.  Pt complains of constant sore throat x1 days and chills x2 days. Pt complains ofFever, associated headache, non-productive cough, decreased appetite, and anterior neck soreness. Pt notes possible sick contact. Pt denies abdominal pain, nausea, vomiting, drooling. Patient is been taking Tylenol without relief.  Past Medical History:  Diagnosis Date  . Hypertension   . Mass    left buttock    Patient Active Problem List   Diagnosis Date Noted  . 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 Vitamin D deficiency 11/11/2014  . HTN (hypertension) 11/21/2011    Past Surgical History:  Procedure Laterality Date  . MASS EXCISION Left 01/17/2016   Procedure: EXCISION BIOPSY OF MASS LEFT BUTTOCK ;  Surgeon: Leighton Ruff, MD;  Location: [facility];  Service: General;  Laterality: Left;  . TUBAL LIGATION Bilateral 10-30-2003   w/ Bilateral Salpingectomy    OB History    No data available       Home Medications    Prior to Admission medications   Medication Sig Start Date End Date Taking? Authorizing Provider  amitriptyline (ELAVIL) 25 MG tablet Take 1 tablet (25 mg total) by mouth at bedtime. 11/15/15   Shawnee Knapp, MD  amLODipine-valsartan (EXFORGE) 10-320 MG tablet Take 1 tablet by mouth daily. Patient taking differently: Take 1 tablet by mouth every morning.  11/15/15   Shawnee Knapp, MD  Cholecalciferol  (VITAMIN D3) 50000 units CAPS Take 1 capsule by mouth once a week.    Historical Provider, MD  clonazePAM (KLONOPIN) 1 MG tablet Take 1 tablet (1 mg total) by mouth at bedtime. For sleep.  Can take 1/2 tab qam as needed for anxiety 11/15/15   Shawnee Knapp, MD  HYDROcodone-acetaminophen (NORCO/VICODIN) 5-325 MG tablet Take 1 tablet by mouth every 4 (four) hours as needed. 02/18/05   Leighton Ruff, MD  penicillin v potassium (VEETID) 500 MG tablet Take 1 tablet (500 mg total) by mouth 2 (two) times daily. 06/03/16 06/13/16  Frederica Kuster, PA-C  Vitamin D, Ergocalciferol, (DRISDOL) 50000 units CAPS capsule TAKE 1 CAPSULE BY MOUTH ONCE A WEEK 11/17/15   Shawnee Knapp, MD    Family History Family History  Problem Relation Age of Onset  . Diabetes Mother   . Heart disease Mother   . Hypertension Mother   . Stroke Mother   . Cancer Father   . Hypertension Father   . Stroke Sister     Social History Social History  Substance Use Topics  . Smoking status: Never Smoker  . Smokeless tobacco: Never Used  . Alcohol use Yes     Comment: occasional     Allergies   Other   Review of Systems Review of Systems  Constitutional: Positive for appetite change (decreased), chills and fatigue. Negative for fever.  HENT: Positive for sore throat. Negative for congestion and facial swelling.   Respiratory: Positive for cough. Negative for  shortness of breath.   Cardiovascular: Negative for chest pain.  Gastrointestinal: Negative for abdominal pain, nausea and vomiting.  Genitourinary: Negative for dysuria.  Musculoskeletal: Positive for neck pain (anterior). Negative for back pain.  Skin: Negative for rash and wound.  Neurological: Positive for headaches.  Psychiatric/Behavioral: The patient is not nervous/anxious.      Physical Exam Updated Vital Signs BP 131/90 (BP Location: Right Arm)   Pulse 111   Temp 100 F (37.8 C) (Oral)   Resp 18   SpO2 100%   Physical Exam  Constitutional: She appears  well-developed and well-nourished. No distress.  HENT:  Head: Normocephalic and atraumatic.  Mouth/Throat: No trismus in the jaw. No uvula swelling. Oropharyngeal exudate, posterior oropharyngeal edema and posterior oropharyngeal erythema present. No tonsillar abscesses.  Eyes: Conjunctivae are normal. Pupils are equal, round, and reactive to light. Right eye exhibits no discharge. Left eye exhibits no discharge. No scleral icterus.  Neck: Normal range of motion. Neck supple. No thyromegaly present.  Cardiovascular: Normal rate, regular rhythm, normal heart sounds and intact distal pulses.  Exam reveals no gallop and no friction rub.   No murmur heard. Pulmonary/Chest: Effort normal and breath sounds normal. No stridor. No respiratory distress. She has no wheezes. She has no rales.  Abdominal: Soft. Bowel sounds are normal. She exhibits no distension. There is no tenderness. There is no rebound and no guarding.  Musculoskeletal: She exhibits no edema.  Lymphadenopathy:    She has cervical adenopathy (left, mobile, submandibular).  Neurological: She is alert. Coordination normal.  Skin: Skin is warm and dry. No rash noted. She is not diaphoretic. No pallor.  Psychiatric: She has a normal mood and affect.  Nursing note and vitals reviewed.    ED Treatments / Results  DIAGNOSTIC STUDIES:  Oxygen Saturation is 100% on RA, normal by my interpretation.    COORDINATION OF CARE:  6:30 PM Discussed treatment plan with pt at bedside and pt agreed to plan.   Labs (all labs ordered are listed, but only abnormal results are displayed) Labs Reviewed  RAPID STREP SCREEN (NOT AT [facility]) - Abnormal; Notable for the following:       Result Value   Streptococcus, Group A Screen (Direct) POSITIVE (*)    All other components within normal limits    EKG  EKG Interpretation None       Radiology No results found.  Procedures Procedures (including critical care time)  Medications Ordered in  ED Medications  acetaminophen (TYLENOL) tablet 650 mg (650 mg Oral Given 06/03/16 1916)  dexamethasone (DECADRON) tablet 10 mg (10 mg Oral Given 06/03/16 1916)  penicillin v potassium (VEETID) tablet 500 mg (500 mg Oral Given 06/03/16 1916)     Initial Impression / Assessment and Plan / ED Course  I have reviewed the triage vital signs and the nursing notes.  Pertinent labs & imaging results that were available during my care of the patient were reviewed by me and considered in my medical decision making (see chart for details).  Clinical Course   Pt rapid strep test positive. Pt is tolerating secretions. Presentation not concerning for peritonsillar abscess or spread of infection to deep spaces of the throat; patent airway. Pt will be discharged with penicillin. First dose given in the ED along with a single dose of Decadron. Supportive treatment discussed with Tylenol or ibuprofen, throat lozenges, warm saltwater rinses. Specific return precautions discussed. Recommended PCP follow up. Patient understands and agrees with plan. Pt appears safe for discharge.  Final Clinical Impressions(s) / ED Diagnoses   Final diagnoses:  Strep pharyngitis    New Prescriptions New Prescriptions   PENICILLIN V POTASSIUM (VEETID) 500 MG TABLET    Take 1 tablet (500 mg total) by mouth 2 (two) times daily.  I personally performed the services described in this documentation, which was scribed in my presence. The recorded information has been reviewed and is accurate.     Frederica Kuster, PA-C 06/03/16 1926    Leo Grosser, MD 06/04/16 1311

## 2016-06-04 ENCOUNTER — Encounter: Payer: Self-pay | Admitting: Family Medicine

## 2016-06-06 ENCOUNTER — Encounter (HOSPITAL_BASED_OUTPATIENT_CLINIC_OR_DEPARTMENT_OTHER): Payer: Self-pay

## 2016-06-09 ENCOUNTER — Other Ambulatory Visit: Payer: Self-pay | Admitting: Family Medicine

## 2016-06-09 DIAGNOSIS — D241 Benign neoplasm of right breast: Secondary | ICD-10-CM

## 2016-06-16 ENCOUNTER — Telehealth: Payer: Self-pay

## 2016-06-16 NOTE — Telephone Encounter (Signed)
[facility] called and asked that Dr Brigitte Pulse either co-sign for repeat f/up Dx R breast US with possible biopsy that is in EPIC or sign a fax that they sent for signature. Pt is sched for tomorrow so they need this today. According to Recommendations on results of 3/31 [place] copied here: 6 month diagnostic right breast mammography and ultrasound for the small upper outer quadrant lesion.  Do not see fax that was sent, but printed out order and will have PA sign and fax back.

## 2016-06-16 NOTE — Telephone Encounter (Signed)
Signed and returned to B. Ladell Pier, RN

## 2016-06-16 NOTE — Telephone Encounter (Incomplete)
Faxed orders back to [facility] w/confirmation. Dr Brigitte Pulse, Juluis Rainier.

## 2016-06-17 ENCOUNTER — Ambulatory Visit
Admission: RE | Admit: 2016-06-17 | Discharge: 2016-06-17 | Disposition: A | Discharge status: Home / Self Care | Payer: Managed Care, Other (non HMO) | Source: Ambulatory Visit | Attending: Family Medicine | Admitting: Family Medicine

## 2016-06-17 ENCOUNTER — Other Ambulatory Visit: Payer: Self-pay | Admitting: Family Medicine

## 2016-06-17 DIAGNOSIS — D241 Benign neoplasm of right breast: Secondary | ICD-10-CM

## 2016-06-17 DIAGNOSIS — N6310 Unspecified lump in the right breast, unspecified quadrant: Secondary | ICD-10-CM

## 2016-06-26 ENCOUNTER — Ambulatory Visit (INDEPENDENT_AMBULATORY_CARE_PROVIDER_SITE_OTHER): Payer: Managed Care, Other (non HMO) | Admitting: Family Medicine

## 2016-06-27 ENCOUNTER — Ambulatory Visit (INDEPENDENT_AMBULATORY_CARE_PROVIDER_SITE_OTHER): Payer: Managed Care, Other (non HMO) | Admitting: Family Medicine

## 2016-06-27 VITALS — BP 120/74 | HR 91 | Temp 98.3°F | Resp 18 | Ht 62.0 in | Wt 184.6 lb

## 2016-06-27 DIAGNOSIS — L239 Allergic contact dermatitis, unspecified cause: Secondary | ICD-10-CM

## 2016-06-27 DIAGNOSIS — B370 Candidal stomatitis: Secondary | ICD-10-CM

## 2016-06-27 DIAGNOSIS — N898 Other specified noninflammatory disorders of vagina: Secondary | ICD-10-CM | POA: Diagnosis not present

## 2016-06-27 LAB — POCT WET + KOH PREP
TRICH BY WET PREP: ABSENT
YEAST BY WET PREP: ABSENT
Yeast by KOH: ABSENT

## 2016-06-27 LAB — POCT URINALYSIS DIP (MANUAL ENTRY)
BILIRUBIN UA: NEGATIVE
BILIRUBIN UA: NEGATIVE
Blood, UA: NEGATIVE
Glucose, UA: NEGATIVE
Leukocytes, UA: NEGATIVE
Nitrite, UA: NEGATIVE
PH UA: 5.5
PROTEIN UA: NEGATIVE
SPEC GRAV UA: 1.025
Urobilinogen, UA: 0.2

## 2016-06-27 LAB — POCT SKIN KOH: SKIN KOH, POC: NEGATIVE

## 2016-06-27 LAB — POC MICROSCOPIC URINALYSIS (UMFC): MUCUS RE: ABSENT

## 2016-06-27 LAB — POCT URINE PREGNANCY: Preg Test, Ur: NEGATIVE

## 2016-06-27 MED ORDER — BETAMETHASONE DIPROPIONATE 0.05 % EX CREA: TOPICAL_CREAM | Freq: Two times a day (BID) | CUTANEOUS | 0 refills | Status: DC

## 2016-06-27 MED ORDER — FLUCONAZOLE 150 MG PO TABS: 150.0000 mg | ORAL_TABLET | Freq: Every day | ORAL | 0 refills | Status: DC

## 2016-06-27 NOTE — Patient Instructions (Addendum)
IF you received an x-ray today, you will receive an invoice from [facility]. Please contact [facility] at [phone] with questions or concerns regarding your invoice.   IF you received labwork today, you will receive an invoice from [employer]. Please contact [facility] at [phone] with questions or concerns regarding your invoice.   Our billing staff will not be able to assist you with questions regarding bills from these companies.  You will be contacted with the lab results as soon as they are available. The fastest way to get your results is to activate your My Chart account. Instructions are located on the last page of this paperwork. If you have not heard from [place] regarding the results in 2 weeks, please contact this office.    Monilial Vaginitis Vaginitis in a soreness, swelling and redness (inflammation) of the vagina and vulva. Monilial vaginitis is not a sexually transmitted infection. CAUSES  Yeast vaginitis is caused by yeast (candida) that is normally found in your vagina. With a yeast infection, the candida has overgrown in number to a point that upsets the chemical balance. SYMPTOMS   White, thick vaginal discharge.  Swelling, itching, redness and irritation of the vagina and possibly the lips of the vagina (vulva).  Burning or painful urination.  Painful intercourse. DIAGNOSIS  Things that may contribute to monilial vaginitis are:  Postmenopausal and virginal states.  Pregnancy.  Infections.  Being tired, sick or stressed, especially if you had monilial vaginitis in the past.  Diabetes. Good control will help lower the chance.  Birth control pills.  Tight fitting garments.  Using bubble bath, feminine sprays, douches or deodorant tampons.  Taking certain medications that kill germs (antibiotics).  Sporadic recurrence can occur if you become ill. TREATMENT  Your caregiver will give you  medication.  There are several kinds of anti monilial vaginal creams and suppositories specific for monilial vaginitis. For recurrent yeast infections, use a suppository or cream in the vagina 2 times a week, or as directed.  Anti-monilial or steroid cream for the itching or irritation of the vulva may also be used. Get your caregiver's permission.  Painting the vagina with methylene blue solution may help if the monilial cream does not work.  Eating yogurt may help prevent monilial vaginitis. HOME CARE INSTRUCTIONS   Finish all medication as prescribed.  Do not have sex until treatment is completed or after your caregiver tells you it is okay.  Take warm sitz baths.  Do not douche.  Do not use tampons, especially scented ones.  Wear cotton underwear.  Avoid tight pants and panty hose.  Tell your sexual partner that you have a yeast infection. They should go to their caregiver if they have symptoms such as mild rash or itching.  Your sexual partner should be treated as well if your infection is difficult to eliminate.  Practice safer sex. Use condoms.  Some vaginal medications cause latex condoms to fail. Vaginal medications that harm condoms are:  Cleocin cream.  Butoconazole (Femstat).  Terconazole (Terazol) vaginal suppository.  Miconazole (Monistat) (may be purchased over the counter). SEEK MEDICAL CARE IF:   You have a temperature by mouth above 102 F (38.9 C).  The infection is getting worse after 2 days of treatment.  The infection is not getting better after 3 days of treatment.  You develop blisters in or around your vagina.  You develop vaginal bleeding, and it is not your menstrual period.  You have pain when  you urinate.  You develop intestinal problems.  You have pain with sexual intercourse.   This information is not intended to replace advice given to you by your health care provider. Make sure you discuss any questions you have with your  health care provider.   Document Released: 06/11/2005 Document Revised: 11/24/2011 Document Reviewed: 03/05/2015 Elsevier Interactive Patient Education 2016 [place], Adult Ritta Slot, also called oral candidiasis, is a fungal infection that develops in the mouth and throat and on the tongue. It causes white patches to form on the mouth and tongue. Ritta Slot is most common in older adults, but it can occur at any age.  Many cases of thrush are mild, but this infection can also be more serious. Ritta Slot can be a recurring problem for people who have chronic illnesses or who take medicines that limit the body's ability to fight infection. Because these people have difficulty fighting infections, the fungus that causes thrush can spread throughout the body. This can cause life-threatening blood or organ infections. CAUSES  Ritta Slot is usually caused by a yeast called Candida albicans. This fungus is normally present in small amounts in the mouth and on other mucous membranes. It usually causes no harm. However, when conditions are present that allow the fungus to grow uncontrolled, it invades surrounding tissues and becomes an infection. Less often, other Candida species can also lead to thrush.  RISK FACTORS Ritta Slot is more likely to develop in the following people:  People with an impaired ability to fight infection (weakened immune system).   Older adults.   People with HIV.   People with diabetes.   People with dry mouth (xerostomia).   Pregnant women.   People with poor dental care, especially those who have false teeth.   People who use antibiotic medicines.  SIGNS AND SYMPTOMS  Ritta Slot can be a mild infection that causes no symptoms. If symptoms develop, they may include:   A burning feeling in the mouth and throat. This can occur at the start of a thrush infection.   White patches that adhere to the mouth and tongue. The tissue around the patches may be red, raw, and  painful. If rubbed (during tooth brushing, for example), the patches and the tissue of the mouth may bleed easily.   A bad taste in the mouth or difficulty tasting foods.   Cottony feeling in the mouth.   Pain during eating and swallowing. DIAGNOSIS  Your health care provider can usually diagnose thrush by looking in your mouth and asking you questions about your health.  TREATMENT  Medicines that help prevent the growth of fungi (antifungals) are the standard treatment for thrush. These medicines are either applied directly to the affected area (topical) or swallowed (oral). The treatment will depend on the severity of the condition.  Mild Thrush Mild cases of thrush may clear up with the use of an antifungal mouth rinse or lozenges. Treatment usually lasts about 14 days.  Moderate to Severe Thrush  More severe thrush infections that have spread to the esophagus are treated with an oral antifungal medicine. A topical antifungal medicine may also be used.   For some severe infections, a treatment period longer than 14 days may be needed.   Oral antifungal medicines are almost never used during pregnancy because the fetus may be harmed. However, if a pregnant woman has a rare, severe thrush infection that has spread to her blood, oral antifungal medicines may be used. In this case, the risk  of harm to the mother and fetus from the severe thrush infection may be greater than the risk posed by the use of antifungal medicines.  Persistent or Recurrent Thrush For cases of thrush that do not go away or keep coming back, treatment may involve the following:   Treatment may be needed twice as long as the symptoms last.   Treatment will include both oral and topical antifungal medicines.   People with weakened immune systems can take an antifungal medicine on a continuous basis to prevent thrush infections.  It is important to treat conditions that make you more likely to get thrush, such  as diabetes or HIV.  HOME CARE INSTRUCTIONS   Only take over-the-counter or prescription medicine as directed by your health care provider. Talk to your health care provider about an over-the-counter medicine called gentian violet, which kills bacteria and fungi.   Eat plain, unflavored yogurt as directed by your health care provider. Check the label to make sure the yogurt contains live cultures. This yogurt can help healthy bacteria grow in the mouth that can stop the growth of the fungus that causes thrush.   Try these measures to help reduce the discomfort of thrush:   Drink cold liquids such as water or iced tea.   Try flavored ice treats or frozen juices.   Eat foods that are easy to swallow, such as gelatin, ice cream, or custard.   If the patches in your mouth are painful, try drinking from a straw.   Rinse your mouth several times a day with a warm saltwater rinse. You can make the saltwater mixture with 1 tsp (6 g) of salt in 8 fl oz (0.2 L) of warm water.   If you wear dentures, remove the dentures before going to bed, brush them vigorously, and soak them in a cleaning solution as directed by your health care provider.   Women who are breastfeeding should clean their nipples with an antifungal medicine as directed by their health care provider. Dry the nipples after breastfeeding. Applying lanolin-containing body lotion may help relieve nipple soreness.  SEEK MEDICAL CARE IF:  Your symptoms are getting worse or are not improving within 7 days of starting treatment.   You have symptoms of spreading infection, such as white patches on the skin outside of the mouth.   You are nursing and you have redness, burning, or pain in the nipples that is not relieved with treatment.  MAKE SURE YOU:  Understand these instructions.  Will watch your condition.  Will get help right away if you are not doing well or get worse.   This information is not intended to replace  advice given to you by your health care provider. Make sure you discuss any questions you have with your health care provider.   Document Released: 05/27/2004 Document Revised: 09/22/2014 Document Reviewed: 04/04/2013 Elsevier Interactive Patient Education [employer].

## 2016-06-27 NOTE — Progress Notes (Addendum)
Subjective:    Patient was seen in Room 2 .   Patient ID: Pamela Allen, female    DOB: Feb 21, 1976, 40 y.o.   MRN: [id number] Chief Complaint  Patient presents with  . Vaginal Discharge    vaginal itching x 3 days, has been on abx for strep    HPI Pamela Allen is a 40 y.o. female who presents to [facility] complaining of vaginal discharge with irritation for 3 days. Is mildly itchy. No odor.  Has not tried any treatment. She was on abx for strep throat several wks ago.  Menses regular.  She is starting her menses today.  She has had intercourse once in the past 2 yrs (huband passed away from pancreatic cancer <2 yrs prior.)  She is starting to date.  Her throat is a little scratchy and hurts on the left a little still but no were like her prior strep pharyngitis.  Has noticed a lot of white on her tongue that she has been trying to brush off w/o success.   Has had a raised rash on the back of her neck which is itchy - got it after she got her hair done last week - she thinks it might have been caused by the drop. Does have very sensitive skin. Has been trying top hydrocortisone cream w/o improvement.  Past Medical History:  Diagnosis Date  . Hypertension   . Mass    left buttock   Prior to Admission medications   Medication Sig Start Date End Date Taking? Authorizing Provider  amitriptyline (ELAVIL) 25 MG tablet Take 1 tablet (25 mg total) by mouth at bedtime. 11/15/15  Yes Shawnee Knapp, MD  amLODipine-valsartan (EXFORGE) 10-320 MG tablet Take 1 tablet by mouth daily. Patient taking differently: Take 1 tablet by mouth every morning.  11/15/15  Yes Shawnee Knapp, MD  clonazePAM (KLONOPIN) 1 MG tablet Take 1 tablet (1 mg total) by mouth at bedtime. For sleep.  Can take 1/2 tab qam as needed for anxiety 11/15/15  Yes Shawnee Knapp, MD  Vitamin D, Ergocalciferol, (DRISDOL) 50000 units CAPS capsule TAKE 1 CAPSULE BY MOUTH ONCE A WEEK 11/17/15  Yes Shawnee Knapp, MD   Allergies  Allergen Reactions  .  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  HENT: Positive for sore throat. Negative for drooling, mouth sores and trouble swallowing.   Gastrointestinal: Negative for abdominal pain, anal bleeding, blood in stool, constipation, diarrhea and rectal pain.  Genitourinary: Positive for vaginal bleeding, vaginal discharge and vaginal pain. Negative for decreased urine volume, difficulty urinating, dysuria, enuresis, flank pain, frequency, hematuria, menstrual problem, pelvic pain and urgency.  Musculoskeletal: Negative for gait problem, neck pain and neck stiffness.  Skin: Positive for rash.  Allergic/Immunologic: Negative for immunocompromised state.  Hematological: Negative for adenopathy.  Psychiatric/Behavioral: The patient is not nervous/anxious.        Objective:   Physical Exam  Constitutional: She is oriented to person, place, and time. She appears well-developed and well-nourished. No distress.  HENT:  Head: Normocephalic and atraumatic.  Right Ear: External ear normal.  Left Ear: External ear normal.  Mouth/Throat: Uvula is midline and mucous membranes are normal. Posterior oropharyngeal erythema present. No oropharyngeal exudate or posterior oropharyngeal edema.  Tongue white and does not scrape off  Eyes: Conjunctivae and EOM are normal. Pupils are equal, round,  and reactive to light. No scleral icterus.  Neck: Normal range of motion. Neck supple. No thyromegaly present.  Cardiovascular: Normal rate, regular rhythm, normal heart sounds and intact distal pulses.   Pulmonary/Chest: Effort normal and breath sounds normal. No respiratory distress.  Musculoskeletal: Normal range of motion. She exhibits no edema.  Lymphadenopathy:    She has no cervical adenopathy.  Neurological: She is alert and oriented to person, place, and time.  Skin:  Skin is warm and dry. She is not diaphoretic. No erythema.  Psychiatric: She has a normal mood and affect. Her behavior is normal.  Nursing note and vitals reviewed.   BP 120/74 (BP Location: Right Arm, Patient Position: Sitting, Cuff Size: Normal)   Pulse 91   Temp 98.3 F (36.8 C) (Oral)   Resp 18   Ht 5' 2"  (1.575 m)   Wt 184 lb 9.6 oz (83.7 kg)   LMP 05/31/2016   SpO2 99%   BMI 33.76 kg/m    Results for orders placed or performed in visit on 06/27/16  POCT Microscopic Urinalysis ([facility])  Result Value Ref Range   WBC,UR,HPF,POC Few (A) None WBC/hpf   RBC,UR,HPF,POC None None RBC/hpf   Bacteria None None, Too numerous to count   Mucus Absent Absent   Epithelial Cells, UR Per Microscopy Moderate (A) None, Too numerous to count cells/hpf  POCT urine pregnancy  Result Value Ref Range   Preg Test, Ur Negative Negative  POCT Wet + KOH Prep  Result Value Ref Range   Yeast by KOH Absent Present, Absent   Yeast by wet prep Absent Present, Absent   WBC by wet prep Moderate (A) None, Few, Too numerous to count   Clue Cells Wet Prep HPF POC Few (A) None, Too numerous to count   Trich by wet prep Absent Present, Absent   Bacteria Wet Prep HPF POC Few None, Few, Too numerous to count   Epithelial Cells By [employer] ([facility]) Moderate (A) None, Few, Too numerous to count   RBC,UR,HPF,POC None None RBC/hpf  POCT Skin KOH  Result Value Ref Range   Skin KOH, POC Negative   POCT urinalysis dipstick  Result Value Ref Range   Color, UA yellow yellow   Clarity, UA clear clear   Glucose, UA negative negative   Bilirubin, UA negative negative   Ketones, POC UA negative negative   Spec Grav, UA 1.025    Blood, UA negative negative   pH, UA 5.5    Protein Ur, POC negative negative   Urobilinogen, UA 0.2    Nitrite, UA Negative Negative   Leukocytes, UA Negative Negative       Assessment & Plan:   1. Vaginal discharge   2. Allergic contact dermatitis, unspecified trigger - at base of  neck likely from hair product. Try top betamethasone  3. Vaginal irritation   4.      Thrush 5.       HTN - well ocntrolled, ok to refill meds x 6 mos whenever requested Suspect vaginal moniliasis and thrush from recent abx for strep so will try 1 wk of fluconazole.    Orders Placed This Encounter  Procedures  . GC/Chlamydia Probe Amp  . POCT Microscopic Urinalysis ([facility])  . POCT urine pregnancy  . POCT Wet + KOH Prep  . POCT Skin KOH  . POCT urinalysis dipstick    Meds ordered this encounter  Medications  . betamethasone dipropionate (DIPROLENE) 0.05 % cream    Sig: Apply topically  2 (two) times daily.    Dispense:  30 g    Refill:  0  . fluconazole (DIFLUCAN) 150 MG tablet    Sig: Take 1 tablet (150 mg total) by mouth daily.    Dispense:  14 tablet    Refill:  0    Delman Cheadle, M.D.  [facility] [address] [place], [place] [address] [phone] phone [phone] fax  06/28/16 12:07 AM

## 2016-06-28 LAB — GC/CHLAMYDIA PROBE AMP
CT Probe RNA: NOT DETECTED
GC Probe RNA: NOT DETECTED

## 2016-06-30 ENCOUNTER — Telehealth: Payer: Self-pay | Admitting: Emergency Medicine

## 2016-06-30 NOTE — Progress Notes (Signed)
Pt aware and verbalizes understanding

## 2016-12-21 NOTE — Progress Notes (Deleted)
   Subjective:    Patient ID: Pamela Allen, female    DOB: Jan 28, 1976, 41 y.o.   MRN: [id number]  HPI   Pamela Allen is a delightful 41 yo woman here today for her complete physical. Her last was done 1 yr prior on 11/15/15.  Primary Preventative Screenings: Cervical Cancer: Negative pap with -HR HPV 11/2015 - repeat 2022 Family Planning: ?? STI screening: ? Breast Cancer: breast biopsy done 12/19/15 and repeat US done 06/17/16. Recommended to have repeat US and diagnostic bilateral mammogram again this mo. Colorectal Cancer: Tobacco use: Bone Density: Cardiac: Weight/blood sugar: OTC/vit/supp/herbal: Diet/Exercise/EtOH/substances: Dentist/Optho: Immunizations: flu shot?? Immunization History  Administered Date(s) Administered  . Influenza-Unspecified 08/29/2014  . Tdap 11/21/2011      Chronic Medical Conditions: HTN: On amlodipine-valsartan 10-321m qam Vit D def: s/p high dose replacement x 6 mos, now on otc x 6 mos?? Anemia: borderline. On iron supp?  On elavil 218m Review of Systems     Objective:   Physical Exam        Assessment & Plan:  Vit D, cbc, ferritin, ua, cmp, tsh, lipid

## 2016-12-22 ENCOUNTER — Encounter: Payer: Self-pay | Admitting: Family Medicine

## 2017-01-05 ENCOUNTER — Other Ambulatory Visit: Payer: Self-pay | Admitting: Family Medicine

## 2017-01-05 DIAGNOSIS — N63 Unspecified lump in unspecified breast: Secondary | ICD-10-CM

## 2017-01-15 ENCOUNTER — Ambulatory Visit
Admission: RE | Admit: 2017-01-15 | Discharge: 2017-01-15 | Disposition: A | Discharge status: Home / Self Care | Payer: 59 | Source: Ambulatory Visit | Attending: Family Medicine | Admitting: Family Medicine

## 2017-01-15 DIAGNOSIS — N63 Unspecified lump in unspecified breast: Secondary | ICD-10-CM

## 2017-01-16 ENCOUNTER — Other Ambulatory Visit: Payer: Self-pay | Admitting: Family Medicine

## 2017-01-16 ENCOUNTER — Telehealth: Payer: Self-pay | Admitting: Family Medicine

## 2017-01-16 NOTE — Telephone Encounter (Signed)
Pt no-showed an appt she had scheduled with me on 12/22/08 for a full CPE. Here are the notes I had made in preparation for her visit:   [name] is a delightful 41 yo woman here today for her complete physical. Her last was done 1 yr prior on 11/15/15.  Primary Preventative Screenings: Cervical Cancer: Negative pap with -HR HPV 11/2015 - repeat 2022 Family Planning: ?? STI screening: ? Breast Cancer: breast biopsy done 12/19/15 and repeat US done 06/17/16. Recommended to have repeat US and diagnostic bilateral mammogram again this mo. Colorectal Cancer: Tobacco use: Bone Density: Cardiac: Weight/blood sugar: OTC/vit/supp/herbal: Diet/Exercise/EtOH/substances: Dentist/Optho: Immunizations: flu shot??  Chronic Medical Conditions: HTN: On amlodipine-valsartan 10-388m qam Vit D def: s/p high dose replacement x 6 mos, now on otc x 6 mos?? Anemia: borderline. On iron supp?  On elavil 251m Plan: Vit D, cbc, ferritin, ua, cmp, tsh, lipid

## 2017-01-16 NOTE — Telephone Encounter (Signed)
Needs OV for any further refills.

## 2017-01-25 NOTE — Progress Notes (Addendum)
Subjective:    Patient ID: Pamela Allen, female    DOB: 1976-01-03, 41 y.o.   MRN: [id number] Chief Complaint  Patient presents with  . Annual Exam    NO PAP SMEAR    HPI  [name] is a delightful 41 yo woman here today for her complete physical. Her last was done 1 yr prior on 11/15/15.  Primary Preventative Screenings: Cervical Cancer: Negative pap with -HR HPV 11/2015 - repeat 2022 Family Planning: s/p BTL STI screening: She is sexually active and has a white-gray vag d/c. No itching but does have a slight fishy odor. Does use a condom every time religiously. She is very sensitive to different materials such as neoprene allergy and did recently change the brand of condom. Breast Cancer: breast biopsy done 12/19/15 and repeat US done 06/17/16. Repeat was done last wk 01/15/17 and was told to repeat diagnostic mam and Rt [place] in 1 yr. Tobacco use: hates   Bone Density: On vit D replacement Cardiac: Weight/blood sugar: weight stable - she did gain weight over the winter but has lost these 5 lbs OTC/vit/supp/herbal: Vit D high dose once a week since 11/17/15.  No calcium supp but eats yogurt every day.  Diet/Exercise/EtOH/substances: walking for exercise 3-4x/wk. She is trying to eat more baked foods, veggies/fruits, cooking at home. Rare social EtOH Dentist/Optho: Does cleaning every 6 mos and annual eye exam. Immunizations: flu shot at work annually. Immunization History  Administered Date(s) Administered  . Influenza-Unspecified 08/29/2014  . Tdap 11/21/2011     Chronic Medical Conditions: HTN: On amlodipine-valsartan 10-3103m qam - checks BP at home and is 120s/80s Vit D def: s/p high dose replacement x 139mo still taking Anemia: borderline. Not on iron. Periods on schedule, follows with period tracker and it is very regular and lasts 4d. Occ dysmenorrhea and sometimes bleeding heavy which we presume are due to her fibroids but otherwise periods are fine.  On elavil 2568m was  able to stop as mood great and sleeping well.  Seasonal allergies: Eyes watering, PND, sneezing. Has failed otc allegra and claritin. Hates nasal sprays. Has not tried eye drops.   Past Medical History:  Diagnosis Date  . Hypertension   . Mass    left buttock   Past Surgical History:  Procedure Laterality Date  . BREAST EXCISIONAL BIOPSY     right us [place]  . MASS EXCISION Left 01/17/2016   Procedure: EXCISION BIOPSY OF MASS LEFT BUTTOCK ;  Surgeon: AliLeighton RuffD;  [facility];  Laterality: Left;  . TUBAL LIGATION Bilateral 10-30-2003   w/ Bilateral Salpingectomy   Current Outpatient Prescriptions on File Prior to Visit  Medication Sig Dispense Refill  . Vitamin D, Ergocalciferol, (DRISDOL) 50000 units CAPS capsule TAKE 1 CAPSULE BY MOUTH ONCE A WEEK 12 capsule 3   No current facility-administered medications on file prior to visit.    Allergies  Allergen Reactions  . 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 Diabetes Mother   . Heart disease Mother   . Hypertension Mother   . Stroke Mother   . Cancer Father   . Hypertension Father   . Stroke Sister    Social History   Social History  . Marital status: Single    Spouse name: N/A  . Number of children: N/A  . Years of education: N/A   Social History Main  Topics  . Smoking status: Never Smoker  . Smokeless tobacco: Never Used  . Alcohol use Yes     Comment: occasional  . Drug use: No  . Sexual activity: Not Asked   Other Topics Concern  . None   Social History Narrative   Married   Cytogeneticist / CNA   Depression screen [facility] 2/9 01/26/2017 06/27/2016 11/15/2015  Decreased Interest 0 0 0  Down, Depressed, Hopeless 0 0 2  PHQ - 2 Score 0 0 2  Altered sleeping - - 3  Tired, decreased energy - - 3  Change in appetite - - 2  Feeling bad or failure about yourself  - - 0  Trouble concentrating - - 0    Moving slowly or fidgety/restless - - 1  Suicidal thoughts - - 0  PHQ-9 Score - - 11     Review of Systems  HENT: Positive for sneezing. Negative for sore throat and trouble swallowing.   Eyes: Positive for itching. Negative for photophobia and visual disturbance.  Genitourinary: Positive for vaginal discharge.  Allergic/Immunologic: Positive for environmental allergies.  All other systems reviewed and are negative.  See hpi    Objective:   Physical Exam  Constitutional: She is oriented to person, place, and time. She appears well-developed and well-nourished. No distress.  HENT:  Head: Normocephalic and atraumatic.  Right Ear: Tympanic membrane, external ear and ear canal normal.  Left Ear: Tympanic membrane, external ear and ear canal normal.  Nose: Nose normal. No mucosal edema or rhinorrhea.  Mouth/Throat: Uvula is midline, oropharynx is clear and moist and mucous membranes are normal. No posterior oropharyngeal erythema.  Eyes: Conjunctivae and EOM are normal. Pupils are equal, round, and reactive to light. Right eye exhibits no discharge. Left eye exhibits no discharge. No scleral icterus.  Neck: Normal range of motion. Neck supple. No thyromegaly present.  Cardiovascular: Normal rate, regular rhythm, normal heart sounds and intact distal pulses.   Pulmonary/Chest: Effort normal and breath sounds normal. No respiratory distress.  Abdominal: Soft. Bowel sounds are normal. There is no tenderness.  Musculoskeletal: She exhibits no edema.  Lymphadenopathy:    She has no cervical adenopathy.  Neurological: She is alert and oriented to person, place, and time. She has normal reflexes.  Skin: Skin is warm and dry. She is not diaphoretic. No erythema.  Psychiatric: She has a normal mood and affect. Her behavior is normal.      BP 137/87 (BP Location: Right Arm, Patient Position: Sitting, Cuff Size: Normal)   Pulse 73   Temp 97.9 F (36.6 C) (Oral)   Resp 16   Ht 5' 2.32"  (1.583 m)   Wt 184 lb (83.5 kg)   LMP 01/17/2017   SpO2 100%   BMI 33.31 kg/m      Assessment & Plan:  Vit D, cbc, ferritin, ua, cmp, tsh,  Lipid - pt did have a yogurt and a banana today at 8 am (7 hrs prior) so I attempted to cancel lipid panel but was unsuccessful  Ok to call for fluconazole if needed after BV  1. Annual physical exam   2. Screening for cardiovascular, respiratory, and genitourinary diseases   3. Screening for thyroid disorder   4. Essential hypertension - doing great, cont exforge  5. Vitamin D deficiency - pt is completing of course of 1 yr of rx high dose still  6. Anemia due to chronic blood loss   7. Bacterial vaginosis - diagnosed  symptomatically. If sxs persist, RTC for wet prep and pelvic exam.  8. Allergic conjunctivitis and rhinitis, bilateral - failed otc allegra and claritin. Hates nasal sprays. Try xyzal and pataday eye gtts. DepoMedrol IM today due to severity of sxs.    Orders Placed This Encounter  Procedures  . VITAMIN D 25 Hydroxy (Vit-D Deficiency, Fractures)  . CBC  . Comprehensive metabolic panel  . TSH  . Ferritin  . Lipid panel    Order Specific Question:   Has the patient fasted?    Answer:   Yes  . Lipid panel  . Care order/instruction:    Scheduling Instructions:     Complete orders, AVS and go.  Marland Kitchen POCT urinalysis dipstick    Meds ordered this encounter  Medications  . metroNIDAZOLE (FLAGYL) 500 MG tablet    Sig: Take 1 tablet (500 mg total) by mouth 2 (two) times daily.    Dispense:  14 tablet    Refill:  0  . methylPREDNISolone acetate (DEPO-MEDROL) injection 80 mg  . amLODipine-valsartan (EXFORGE) 10-320 MG tablet    Sig: Take 1 tablet by mouth daily.    Dispense:  90 tablet    Refill:  3  . levocetirizine (XYZAL) 5 MG tablet    Sig: Take 1 tablet (5 mg total) by mouth every evening.    Dispense:  30 tablet    Refill:  5  . Olopatadine HCl (PATADAY) 0.2 % SOLN    Sig: Apply 1 drop to eye daily.    Dispense:  2.5  mL    Refill:  1     Delman Cheadle, M.D.  Primary Care at [facility] [address] [place], [place] [address] [phone] phone [phone] fax  02/01/17 11:23 PM

## 2017-01-26 ENCOUNTER — Encounter: Payer: Self-pay | Admitting: Family Medicine

## 2017-01-26 ENCOUNTER — Ambulatory Visit (INDEPENDENT_AMBULATORY_CARE_PROVIDER_SITE_OTHER): Payer: 59 | Admitting: Family Medicine

## 2017-01-26 VITALS — BP 137/87 | HR 73 | Temp 97.9°F | Resp 16 | Ht 62.32 in | Wt 184.0 lb

## 2017-01-26 DIAGNOSIS — Z136 Encounter for screening for cardiovascular disorders: Secondary | ICD-10-CM

## 2017-01-26 DIAGNOSIS — H1013 Acute atopic conjunctivitis, bilateral: Secondary | ICD-10-CM | POA: Diagnosis not present

## 2017-01-26 DIAGNOSIS — J309 Allergic rhinitis, unspecified: Secondary | ICD-10-CM

## 2017-01-26 DIAGNOSIS — Z1329 Encounter for screening for other suspected endocrine disorder: Secondary | ICD-10-CM | POA: Diagnosis not present

## 2017-01-26 DIAGNOSIS — Z0000 Encounter for general adult medical examination without abnormal findings: Secondary | ICD-10-CM | POA: Diagnosis not present

## 2017-01-26 DIAGNOSIS — D500 Iron deficiency anemia secondary to blood loss (chronic): Secondary | ICD-10-CM

## 2017-01-26 DIAGNOSIS — N760 Acute vaginitis: Secondary | ICD-10-CM

## 2017-01-26 DIAGNOSIS — Z1389 Encounter for screening for other disorder: Secondary | ICD-10-CM

## 2017-01-26 DIAGNOSIS — E559 Vitamin D deficiency, unspecified: Secondary | ICD-10-CM | POA: Diagnosis not present

## 2017-01-26 DIAGNOSIS — B9689 Other specified bacterial agents as the cause of diseases classified elsewhere: Secondary | ICD-10-CM | POA: Diagnosis not present

## 2017-01-26 DIAGNOSIS — I10 Essential (primary) hypertension: Secondary | ICD-10-CM | POA: Diagnosis not present

## 2017-01-26 DIAGNOSIS — Z1383 Encounter for screening for respiratory disorder NEC: Secondary | ICD-10-CM | POA: Diagnosis not present

## 2017-01-26 LAB — POCT URINALYSIS DIP (MANUAL ENTRY)
BILIRUBIN UA: NEGATIVE
Glucose, UA: NEGATIVE mg/dL
Ketones, POC UA: NEGATIVE mg/dL
Nitrite, UA: NEGATIVE
PH UA: 5.5 (ref 5.0–8.0)
Protein Ur, POC: NEGATIVE mg/dL
RBC UA: NEGATIVE
Spec Grav, UA: >=1.03 — AB (ref 1.010–1.025)
Urobilinogen, UA: 0.2 E.U./dL

## 2017-01-26 MED ORDER — METHYLPREDNISOLONE ACETATE 80 MG/ML IJ SUSP
80.0000 mg | Freq: Once | INTRAMUSCULAR | Status: AC
Start: 2017-01-26 — End: 2017-01-26
  Administered 2017-01-26: 80 mg via INTRAMUSCULAR

## 2017-01-26 MED ORDER — AMLODIPINE BESYLATE-VALSARTAN 10-320 MG PO TABS: 1.0000 | ORAL_TABLET | Freq: Every day | ORAL | 3 refills | Status: DC

## 2017-01-26 MED ORDER — OLOPATADINE HCL 0.2 % OP SOLN: 1.0000 [drp] | Freq: Every day | OPHTHALMIC | 1 refills | Status: DC

## 2017-01-26 MED ORDER — METRONIDAZOLE 500 MG PO TABS: 500.0000 mg | ORAL_TABLET | Freq: Two times a day (BID) | ORAL | 0 refills | Status: DC

## 2017-01-26 MED ORDER — LEVOCETIRIZINE DIHYDROCHLORIDE 5 MG PO TABS: 5.0000 mg | ORAL_TABLET | Freq: Every evening | ORAL | 5 refills | Status: DC

## 2017-01-26 NOTE — Patient Instructions (Addendum)
IF you received an x-ray today, you will receive an invoice from [facility]. Please contact [facility] at [phone] with questions or concerns regarding your invoice.   IF you received labwork today, you will receive an invoice from [facility]. Please contact [facility] at [phone] with questions or concerns regarding your invoice.   Our billing staff will not be able to assist you with questions regarding bills from these companies.  You will be contacted with the lab results as soon as they are available. The fastest way to get your results is to activate your My Chart account. Instructions are located on the last page of this paperwork. If you have not heard from [place] regarding the results in 2 weeks, please contact this office.     Allergies, Adult An allergy is when your body's defense system (immune system) overreacts to an otherwise harmless substance (allergen) that you breathe in or eat or something that touches your skin. When you come into contact with something that you are allergic to, your immune system produces certain proteins (antibodies). These proteins cause cells to release chemicals (histamines) that trigger the symptoms of an allergic reaction. Allergies often affect the nasal passages (allergic rhinitis), eyes (allergic conjunctivitis), skin (atopic dermatitis), and stomach. Allergies can be mild or severe. Allergies cannot spread from person to person (are not contagious). They can develop at any age and may be outgrown. What are the causes? Allergies can be caused by any substance that your immune system mistakenly targets as harmful. These may include:  Outdoor allergens, such as pollen, grass, weeds, car exhaust, and mold spores.  Indoor allergens, such as dust, smoke, mold, and pet dander.  Foods, especially peanuts, milk, eggs, fish, shellfish, soy, nuts, and wheat.  Medicines, such as penicillin.  Skin irritants, such as detergents,  chemicals, and latex.  Perfume.  Insect bites or stings. What increases the risk? You may be at greater risk of allergies if other people in your family have allergies. What are the signs or symptoms? Symptoms depend on what type of allergy you have. They may include:  Runny, stuffy nose.  Sneezing.  Itchy mouth, ears, or throat.  Postnasal drip.  Sore throat.  Itchy, red, watery, or puffy eyes.  Skin rash or hives.  Stomach pain.  Vomiting.  Diarrhea.  Bloating.  Wheezing or coughing. People with a severe allergy to food, medicine, or an insect bite may have a life-threatening allergic reaction (anaphylaxis). Symptoms of anaphylaxis include:  Hives.  Itching.  Flushed face.  Swollen lips, tongue, or mouth.  Tight or swollen throat.  Chest pain or tightness in the chest.  Trouble breathing or shortness of breath.  Rapid heartbeat.  Dizziness or fainting.  Vomiting.  Diarrhea.  Pain in the abdomen. How is this diagnosed? This condition is diagnosed based on:  Your symptoms.  Your family and medical history.  A physical exam. You may need to see a health care provider who specializes in treating allergies (allergist). You may also have tests, including:  Skin tests to see which allergens are causing your symptoms, such as:  Skin prick test. In this test, your skin is pricked with a tiny needle and exposed to small amounts of possible allergens to see if your skin reacts.  Intradermal skin test. In this test, a small amount of allergen is injected under your skin to see if your skin reacts.  Patch test. In this test, a small amount of allergen is placed on your  skin and then your skin is covered with a bandage. Your health care provider will check your skin after a couple of days to see if a rash has developed.  Blood tests.  Challenges tests. In this test, you inhale a small amount of allergen by mouth to see if you have an allergic  reaction. You may also be asked to:  Keep a food diary. A food diary is a record of all the foods and drinks you have in a day and any symptoms you experience.  Practice an elimination diet. An elimination diet involves eliminating specific foods from your diet and then adding them back in one by one to find out if a certain food causes an allergic reaction. How is this treated? Treatment for allergies depends on your symptoms. Treatment may include:  Cold compresses to soothe itching and swelling.  Eye drops.  Nasal sprays.  Using a saline spray or container (neti pot) to flush out the nose (nasal irrigation). These methods can help clear away mucus and keep the nasal passages moist.  Using a humidifier.  Oral antihistamines or other medicines to block allergic reaction and inflammation.  Skin creams to treat rashes or itching.  Diet changes to eliminate food allergy triggers.  Repeated exposure to tiny amounts of allergens to build up a tolerance and prevent future allergic reactions (immunotherapy). These include:  Allergy shots.  Oral treatment. This involves taking small doses of an allergen under the tongue (sublingual immunotherapy).  Emergency epinephrine injection (auto-injector) in case of an allergic emergency. This is a self-injectable, pre-measured medicine that must be given within the first few minutes of a serious allergic reaction. Follow these instructions at home:  Avoid known allergens whenever possible.  If you suffer from airborne allergens, wash out your nose daily. You can do this with a saline spray or a neti pot to flush out your nose (nasal irrigation).  Take over-the-counter and prescription medicines only as told by your health care provider.  Keep all follow-up visits as told by your health care provider. This is important.  If you are at risk of a severe allergic reaction (anaphylaxis), keep your auto-injector with you at all times.  If you  have ever had anaphylaxis, wear a medical alert bracelet or necklace that states you have a severe allergy. Contact a health care provider if:  Your symptoms do not improve with treatment. Get help right away if:  You have symptoms of anaphylaxis, such as:  Swollen mouth, tongue, or throat.  Pain or tightness in your chest.  Trouble breathing or shortness of breath.  Dizziness or fainting.  Severe abdominal pain, vomiting, or diarrhea. This information is not intended to replace advice given to you by your health care provider. Make sure you discuss any questions you have with your health care provider. Document Released: 11/25/2002 Document Revised: 05/01/2016 Document Reviewed: 03/19/2016 Elsevier Interactive Patient Education  2017 [employer].

## 2017-01-27 LAB — CBC
HEMATOCRIT: 37.6 % (ref 34.0–46.6)
Hemoglobin: 11.8 g/dL (ref 11.1–15.9)
MCH: 29.4 pg (ref 26.6–33.0)
MCHC: 31.4 g/dL — ABNORMAL LOW (ref 31.5–35.7)
MCV: 94 fL (ref 79–97)
PLATELETS: 336 10*3/uL (ref 150–379)
RBC: 4.02 x10E6/uL (ref 3.77–5.28)
RDW: 13.3 % (ref 12.3–15.4)
WBC: 6.9 10*3/uL (ref 3.4–10.8)

## 2017-01-27 LAB — LIPID PANEL
CHOLESTEROL TOTAL: 159 mg/dL (ref 100–199)
Chol/HDL Ratio: 2.5 ratio (ref 0.0–4.4)
HDL: 64 mg/dL (ref >39)
LDL CALC: 75 mg/dL (ref 0–99)
Triglycerides: 101 mg/dL (ref 0–149)
VLDL CHOLESTEROL CAL: 20 mg/dL (ref 5–40)

## 2017-01-27 LAB — COMPREHENSIVE METABOLIC PANEL
A/G RATIO: 1.4 (ref 1.2–2.2)
ALT: 14 IU/L (ref 0–32)
AST: 18 IU/L (ref 0–40)
Albumin: 4.5 g/dL (ref 3.5–5.5)
Alkaline Phosphatase: 50 IU/L (ref 39–117)
BUN/Creatinine Ratio: 10 (ref 9–23)
BUN: 9 mg/dL (ref 6–24)
Bilirubin Total: 0.4 mg/dL (ref 0.0–1.2)
CALCIUM: 9.3 mg/dL (ref 8.7–10.2)
CO2: 23 mmol/L (ref 18–29)
CREATININE: 0.88 mg/dL (ref 0.57–1.00)
Chloride: 103 mmol/L (ref 96–106)
GFR, EST AFRICAN AMERICAN: 94 mL/min/{1.73_m2} (ref >59)
GFR, EST NON AFRICAN AMERICAN: 81 mL/min/{1.73_m2} (ref >59)
Globulin, Total: 3.3 g/dL (ref 1.5–4.5)
Glucose: 87 mg/dL (ref 65–99)
POTASSIUM: 4.2 mmol/L (ref 3.5–5.2)
Sodium: 142 mmol/L (ref 134–144)
TOTAL PROTEIN: 7.8 g/dL (ref 6.0–8.5)

## 2017-01-27 LAB — TSH: TSH: 1.96 u[IU]/mL (ref 0.450–4.500)

## 2017-01-27 LAB — VITAMIN D 25 HYDROXY (VIT D DEFICIENCY, FRACTURES): VIT D 25 HYDROXY: 54.8 ng/mL (ref 30.0–100.0)

## 2017-01-27 LAB — FERRITIN: FERRITIN: 13 ng/mL — AB (ref 15–150)

## 2017-02-02 ENCOUNTER — Encounter: Payer: Self-pay | Admitting: Family Medicine

## 2017-02-03 MED ORDER — MONTELUKAST SODIUM 10 MG PO TABS: 10.0000 mg | ORAL_TABLET | Freq: Every day | ORAL | 3 refills | Status: DC

## 2017-02-03 MED ORDER — CETIRIZINE HCL 10 MG PO TABS: 10.0000 mg | ORAL_TABLET | Freq: Every day | ORAL | 11 refills | Status: DC

## 2017-02-22 ENCOUNTER — Encounter: Payer: Self-pay | Admitting: Family Medicine

## 2017-05-19 ENCOUNTER — Inpatient Hospital Stay: Admit: 2017-05-19 | Discharge: 2017-05-19

## 2017-05-19 ENCOUNTER — Encounter: Admit: 2017-05-19 | Primary: Adult Medicine

## 2017-05-19 MED ORDER — KETOROLAC TROMETHAMINE 30 MG/ML IJ SOLN
30 MG/ML | Freq: Once | INTRAMUSCULAR | Status: AC
Start: 2017-05-19 — End: 2017-05-19
  Administered 2017-05-19: 17:00:00 15 mg via INTRAMUSCULAR

## 2017-05-19 MED ORDER — DIAZEPAM 5 MG PO TABS
5 MG | Freq: Once | ORAL | Status: AC
Start: 2017-05-19 — End: 2017-05-19
  Administered 2017-05-19: 17:00:00 5 mg via ORAL

## 2017-05-19 MED ORDER — NAPROXEN 500 MG PO TABS
500 MG | ORAL_TABLET | Freq: Two times a day (BID) | ORAL | 0 refills | Status: AC | PRN
Start: 2017-05-19 — End: ?

## 2017-05-19 MED ORDER — METHOCARBAMOL 500 MG PO TABS
500 MG | ORAL_TABLET | Freq: Four times a day (QID) | ORAL | 0 refills | Status: AC
Start: 2017-05-19 — End: 2017-05-29

## 2017-05-19 MED FILL — KETOROLAC TROMETHAMINE 30 MG/ML IJ SOLN: 30 MG/ML | INTRAMUSCULAR | Qty: 1

## 2017-05-19 MED FILL — DIAZEPAM 5 MG PO TABS: 5 MG | ORAL | Qty: 1
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
## 2017-12-21 ENCOUNTER — Other Ambulatory Visit: Payer: Self-pay | Admitting: Family Medicine

## 2017-12-21 DIAGNOSIS — N6310 Unspecified lump in the right breast, unspecified quadrant: Secondary | ICD-10-CM

## 2018-01-01 ENCOUNTER — Ambulatory Visit: Payer: 59 | Admitting: Physician Assistant

## 2018-01-02 ENCOUNTER — Other Ambulatory Visit: Payer: Self-pay

## 2018-01-02 ENCOUNTER — Ambulatory Visit (INDEPENDENT_AMBULATORY_CARE_PROVIDER_SITE_OTHER): Payer: 59 | Admitting: Physician Assistant

## 2018-01-02 ENCOUNTER — Encounter: Payer: Self-pay | Admitting: Physician Assistant

## 2018-01-02 VITALS — BP 114/90 | HR 79 | Temp 98.2°F | Resp 16 | Ht 62.5 in | Wt 189.6 lb

## 2018-01-02 DIAGNOSIS — N760 Acute vaginitis: Secondary | ICD-10-CM

## 2018-01-02 DIAGNOSIS — R350 Frequency of micturition: Secondary | ICD-10-CM | POA: Diagnosis not present

## 2018-01-02 DIAGNOSIS — J302 Other seasonal allergic rhinitis: Secondary | ICD-10-CM

## 2018-01-02 DIAGNOSIS — B9689 Other specified bacterial agents as the cause of diseases classified elsewhere: Secondary | ICD-10-CM

## 2018-01-02 LAB — POCT WET + KOH PREP
Trich by wet prep: ABSENT
Yeast by KOH: ABSENT
Yeast by wet prep: ABSENT

## 2018-01-02 LAB — POCT URINALYSIS DIP (MANUAL ENTRY)
Bilirubin, UA: NEGATIVE
Blood, UA: NEGATIVE
Glucose, UA: NEGATIVE mg/dL
Ketones, POC UA: NEGATIVE mg/dL
Leukocytes, UA: NEGATIVE
Nitrite, UA: NEGATIVE
Protein Ur, POC: NEGATIVE mg/dL
Spec Grav, UA: 1.025 (ref 1.010–1.025)
Urobilinogen, UA: 0.2 E.U./dL
pH, UA: 6 (ref 5.0–8.0)

## 2018-01-02 LAB — POC MICROSCOPIC URINALYSIS (UMFC)

## 2018-01-02 MED ORDER — METRONIDAZOLE 500 MG PO TABS: 500.0000 mg | ORAL_TABLET | Freq: Two times a day (BID) | ORAL | 0 refills | Status: AC

## 2018-01-02 NOTE — Progress Notes (Signed)
Pamela Allen  MRN: [id number] DOB: [birth year]  PCP: Shawnee Knapp, MD  Chief Complaint  Patient presents with  . frequent urination    pressure, x 2 days, no vaginal discharge    Subjective:  Pt presents to clinic for concerns that she has UTI.  She has had one in the past and this feels the same.  She is having frequency and urgency and dysuria.  She has increase her water and cranberry juice intake. No vaginal symptoms, no change in sex partners.   History is obtained by patient.  Review of Systems  Constitutional: Negative for chills and fever.  Gastrointestinal: Negative for nausea.  Genitourinary: Positive for dysuria, frequency and urgency. Negative for hematuria and menstrual problem.  Musculoskeletal: Negative for back pain.    Patient Active Problem List   Diagnosis Date Noted  . Seasonal allergies 01/02/2018  . 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 Vitamin D deficiency 11/11/2014  . HTN (hypertension) 11/21/2011    Current Outpatient Medications on File Prior to Visit  Medication Sig Dispense Refill  . amLODipine-valsartan (EXFORGE) 10-320 MG tablet Take 1 tablet by mouth daily. 90 tablet 3  . cetirizine (ZYRTEC) 10 MG tablet Take 1 tablet (10 mg total) by mouth at bedtime. 30 tablet 11  . montelukast (SINGULAIR) 10 MG tablet Take 1 tablet (10 mg total) by mouth at bedtime. 30 tablet 3  . Olopatadine HCl (PATADAY) 0.2 % SOLN Apply 1 drop to eye daily. 2.5 mL 1   No current facility-administered medications on file prior to visit.     Allergies  Allergen Reactions  . 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 Hypertension   . Mass    left buttock   Social History   Social History Narrative   Married   Cytogeneticist / CNA   Social History   Tobacco Use  . Smoking status: Never Smoker  . Smokeless tobacco: Never Used  Substance Use Topics  . Alcohol use: Yes    Comment: occasional  .  Drug use: No   family history includes Cancer in her father; Diabetes in her mother; Heart disease in her mother; Hypertension in her father and mother; Stroke in her mother and sister.     Objective:  BP 114/90   Pulse 79   Temp 98.2 F (36.8 C) (Oral)   Resp 16   Ht 5' 2.5" (1.588 m)   Wt 189 lb 9.6 oz (86 kg)   LMP 12/14/2017   SpO2 99%   BMI 34.13 kg/m  Body mass index is 34.13 kg/m.  Physical Exam  Constitutional: She is oriented to person, place, and time.  HENT:  Head: Normocephalic and atraumatic.  Right Ear: External ear normal.  Left Ear: External ear normal.  Cardiovascular: Normal rate, regular rhythm and normal heart sounds.  No murmur heard. Pulmonary/Chest: Effort normal and breath sounds normal.  Abdominal: Soft. Bowel sounds are normal. There is tenderness (suprapubic) in the suprapubic area. There is no CVA tenderness.  Neurological: She is alert and oriented to person, place, and time.  Skin: Skin is warm and dry.  Psychiatric: Judgment normal.  Vitals reviewed.  Results for orders placed or performed in visit on 01/02/18  POCT urinalysis dipstick  Result Value Ref Range   Color, UA yellow yellow   Clarity, UA clear clear   Glucose, UA negative negative mg/dL  Bilirubin, UA negative negative   Ketones, POC UA negative negative mg/dL   Spec Grav, UA 1.025 1.010 - 1.025   Blood, UA negative negative   pH, UA 6.0 5.0 - 8.0   Protein Ur, POC negative negative mg/dL   Urobilinogen, UA 0.2 0.2 or 1.0 E.U./dL   Nitrite, UA Negative Negative   Leukocytes, UA Negative Negative  POCT Microscopic Urinalysis ([facility])  Result Value Ref Range   WBC,UR,HPF,POC Moderate (A) None WBC/hpf   RBC,UR,HPF,POC None None RBC/hpf   Bacteria None None, Too numerous to count   Mucus Present (A) Absent   Epithelial Cells, UR Per Microscopy Many (A) None, Too numerous to count cells/hpf  POCT Wet + KOH Prep  Result Value Ref Range   Yeast by KOH Absent Absent   Yeast  by wet prep Absent Absent   WBC by wet prep Moderate (A) Few   Clue Cells Wet Prep HPF POC Moderate (A) None   Trich by wet prep Absent Absent   Bacteria Wet Prep HPF POC Few Few   Epithelial Cells By [employer] Pref ([facility]) Many (A) None, Few, Too numerous to count   RBC,UR,HPF,POC None None RBC/hpf    Assessment and Plan :  Frequent urination - Plan: POCT urinalysis dipstick, POCT Microscopic Urinalysis ([facility]), Urine Culture, POCT Wet + KOH Prep  Seasonal allergies  BV (bacterial vaginosis) - Plan: metroNIDAZOLE (FLAGYL) 500 MG tablet pt has BV on microscopic exam - we will will treat.  I have sent a urine culture to make sure no UTI because pt states this feels like her other UTIs.  That way if it is just early UTI we can treat with appropriate abx.  Answered pt's questions.  Windell Hummingbird PA-C  Primary Care at [facility] 01/02/2018 1:33 PM

## 2018-01-02 NOTE — Patient Instructions (Signed)
     IF you received an x-ray today, you will receive an invoice from [facility]. Please contact [facility] at [phone] with questions or concerns regarding your invoice.   IF you received labwork today, you will receive an invoice from [facility]. Please contact [facility] at [phone] with questions or concerns regarding your invoice.   Our billing staff will not be able to assist you with questions regarding bills from these companies.  You will be contacted with the lab results as soon as they are available. The fastest way to get your results is to activate your My Chart account. Instructions are located on the last page of this paperwork. If you have not heard from us regarding the results in 2 weeks, please contact this office.     

## 2018-01-03 LAB — URINE CULTURE

## 2018-01-12 ENCOUNTER — Encounter: Payer: Self-pay | Admitting: Physician Assistant

## 2018-01-18 ENCOUNTER — Other Ambulatory Visit: Payer: 59

## 2018-01-18 ENCOUNTER — Ambulatory Visit
Admission: RE | Admit: 2018-01-18 | Discharge: 2018-01-18 | Disposition: A | Discharge status: Home / Self Care | Payer: 59 | Source: Ambulatory Visit | Attending: Family Medicine | Admitting: Family Medicine

## 2018-01-18 DIAGNOSIS — N6310 Unspecified lump in the right breast, unspecified quadrant: Secondary | ICD-10-CM

## 2018-01-27 ENCOUNTER — Other Ambulatory Visit: Payer: Self-pay | Admitting: Family Medicine

## 2018-01-27 NOTE — Telephone Encounter (Signed)
Patient called and advised she is due for her physical before refilling BP medication, asked if the appointment could be scheduled, she agrees. Yearly physical appointment scheduled for Wednesday, 03/03/18 at [facility], advised to be fasting for tentative lab work, advised a 30 day supply of BP medication will be sent to her pharmacy, she verbalized understanding.

## 2018-03-02 DIAGNOSIS — E611 Iron deficiency: Secondary | ICD-10-CM | POA: Insufficient documentation

## 2018-03-02 DIAGNOSIS — Z6834 Body mass index (BMI) 34.0-34.9, adult: Secondary | ICD-10-CM

## 2018-03-02 DIAGNOSIS — E6609 Other obesity due to excess calories: Secondary | ICD-10-CM | POA: Insufficient documentation

## 2018-03-03 ENCOUNTER — Ambulatory Visit (INDEPENDENT_AMBULATORY_CARE_PROVIDER_SITE_OTHER): Payer: 59 | Admitting: Family Medicine

## 2018-03-03 ENCOUNTER — Encounter: Payer: Self-pay | Admitting: Family Medicine

## 2018-03-03 VITALS — BP 125/82 | HR 80 | Temp 98.2°F | Resp 16 | Ht 62.5 in | Wt 188.8 lb

## 2018-03-03 DIAGNOSIS — N852 Hypertrophy of uterus: Secondary | ICD-10-CM | POA: Diagnosis not present

## 2018-03-03 DIAGNOSIS — J302 Other seasonal allergic rhinitis: Secondary | ICD-10-CM

## 2018-03-03 DIAGNOSIS — Z124 Encounter for screening for malignant neoplasm of cervix: Secondary | ICD-10-CM

## 2018-03-03 DIAGNOSIS — E66811 Obesity, class 1: Secondary | ICD-10-CM

## 2018-03-03 DIAGNOSIS — Z1383 Encounter for screening for respiratory disorder NEC: Secondary | ICD-10-CM

## 2018-03-03 DIAGNOSIS — Z113 Encounter for screening for infections with a predominantly sexual mode of transmission: Secondary | ICD-10-CM

## 2018-03-03 DIAGNOSIS — E559 Vitamin D deficiency, unspecified: Secondary | ICD-10-CM

## 2018-03-03 DIAGNOSIS — E6609 Other obesity due to excess calories: Secondary | ICD-10-CM | POA: Diagnosis not present

## 2018-03-03 DIAGNOSIS — Z136 Encounter for screening for cardiovascular disorders: Secondary | ICD-10-CM

## 2018-03-03 DIAGNOSIS — Z6834 Body mass index (BMI) 34.0-34.9, adult: Secondary | ICD-10-CM

## 2018-03-03 DIAGNOSIS — I10 Essential (primary) hypertension: Secondary | ICD-10-CM

## 2018-03-03 DIAGNOSIS — Z130 Encounter for screening for diseases of the blood and blood-forming organs and certain disorders involving the immune mechanism: Secondary | ICD-10-CM

## 2018-03-03 DIAGNOSIS — Z0001 Encounter for general adult medical examination with abnormal findings: Secondary | ICD-10-CM

## 2018-03-03 DIAGNOSIS — N898 Other specified noninflammatory disorders of vagina: Secondary | ICD-10-CM | POA: Diagnosis not present

## 2018-03-03 DIAGNOSIS — Z1329 Encounter for screening for other suspected endocrine disorder: Secondary | ICD-10-CM

## 2018-03-03 DIAGNOSIS — N920 Excessive and frequent menstruation with regular cycle: Secondary | ICD-10-CM

## 2018-03-03 DIAGNOSIS — Z0000 Encounter for general adult medical examination without abnormal findings: Secondary | ICD-10-CM

## 2018-03-03 DIAGNOSIS — Z1389 Encounter for screening for other disorder: Secondary | ICD-10-CM

## 2018-03-03 DIAGNOSIS — E611 Iron deficiency: Secondary | ICD-10-CM

## 2018-03-03 DIAGNOSIS — N946 Dysmenorrhea, unspecified: Secondary | ICD-10-CM

## 2018-03-03 DIAGNOSIS — Z1231 Encounter for screening mammogram for malignant neoplasm of breast: Secondary | ICD-10-CM

## 2018-03-03 LAB — POCT URINALYSIS DIP (MANUAL ENTRY)
BILIRUBIN UA: NEGATIVE mg/dL
Bilirubin, UA: NEGATIVE
Glucose, UA: NEGATIVE mg/dL
Leukocytes, UA: NEGATIVE
Nitrite, UA: NEGATIVE
PH UA: 5.5 (ref 5.0–8.0)
RBC UA: NEGATIVE
UROBILINOGEN UA: 0.2 U/dL

## 2018-03-03 LAB — LIPID PANEL
CHOLESTEROL TOTAL: 125 mg/dL (ref 100–199)
Chol/HDL Ratio: 2.6 ratio (ref 0.0–4.4)
HDL: 49 mg/dL (ref >39)
LDL Calculated: 53 mg/dL (ref 0–99)
Triglycerides: 115 mg/dL (ref 0–149)
VLDL CHOLESTEROL CAL: 23 mg/dL (ref 5–40)

## 2018-03-03 LAB — POCT WET + KOH PREP
TRICH BY WET PREP: ABSENT
YEAST BY KOH: ABSENT
Yeast by wet prep: ABSENT

## 2018-03-03 MED ORDER — AMLODIPINE BESYLATE-VALSARTAN 10-320 MG PO TABS: 1.0000 | ORAL_TABLET | Freq: Every day | ORAL | 3 refills | Status: DC

## 2018-03-03 MED ORDER — OLOPATADINE HCL 0.2 % OP SOLN: 1.0000 [drp] | Freq: Every day | OPHTHALMIC | 5 refills | Status: DC

## 2018-03-03 MED ORDER — MONTELUKAST SODIUM 10 MG PO TABS: 10.0000 mg | ORAL_TABLET | Freq: Every day | ORAL | 3 refills | Status: DC

## 2018-03-03 MED ORDER — CETIRIZINE HCL 10 MG PO TABS: 10.0000 mg | ORAL_TABLET | Freq: Every day | ORAL | 3 refills | Status: DC

## 2018-03-03 MED ORDER — TINIDAZOLE 500 MG PO TABS: 2.0000 g | ORAL_TABLET | Freq: Every day | ORAL | 0 refills | Status: DC

## 2018-03-03 NOTE — Progress Notes (Signed)
Subjective:  By signing my name below, I, Pamela Allen, attest that this documentation has been prepared under the direction and in the presence of Pamela Cheadle, MD Electronically Signed: Ladene Artist, ED Scribe 03/03/2018 at 8:46 AM.   Patient ID: Pamela Allen, female    DOB: [birth year], 42 y.o.   MRN: [id number]  Chief Complaint  Patient presents with  . Annual Exam   HPI Pamela Allen is a 42 y.o. female who presents to [facility] Care at [facility] for an annual exam.  Primary Preventative Screenings: Cervical Cancer: Last pap was a little over 2 yrs ago. Pt does report vaginal discharge at this moment. She has noticed recurrent vaginal itching and discharge 1 wk prior to her menstrual period. She has recently switch to pads. Denies any vaginal bleeding. Family Planning: Normal menstrual periods. STI screening: Breast Cancer: Recently had a mammogram, normal. Colorectal Cancer: Tobacco use/EtOH/substances: Bone Density:  Cardiac: EKG today Weight/Blood sugar/Diet/Exercise: She eats yogurt daily but no other calcium daily; she has stopped calcium supplements. Pt is still walking for exercise. BMI Readings from Last 3 Encounters:  01/02/18 34.13 kg/m  01/26/17 33.31 kg/m  06/27/16 33.76 kg/m   No results found for: HGBA1C OTC/Vit/Supp/Herbal: Still taking 1000 units Vit D qd. Not taking iron supplements. Dentist/Optho: regularly Immunizations:  Immunization History  Administered Date(s) Administered  . Influenza-Unspecified 08/29/2014  . Tdap 11/21/2011     Chronic Medical Conditions: HTN - 120s/80. Denies leg swelling, cp, sob, palpitations, HA, lightheadedness, dizziness. Allergies Pt does report bilateral ear itching. She is using Qtips. States she has been taking prev prescribed Singulair for allergies.  Past Medical History:  Diagnosis Date  . Hypertension   . Mass    left buttock   Past Surgical History:  Procedure Laterality Date  . BREAST EXCISIONAL  BIOPSY     right US guided  . MASS EXCISION Left 01/17/2016   Procedure: EXCISION BIOPSY OF MASS LEFT BUTTOCK ;  Surgeon: Leighton Ruff, MD;  Location: [place];  Service: General;  Laterality: Left;  . TUBAL LIGATION Bilateral 10-30-2003   w/ Bilateral Salpingectomy   Current Outpatient Medications on File Prior to Visit  Medication Sig Dispense Refill  . amLODipine-valsartan (EXFORGE) 10-320 MG tablet TAKE 1 TABLET BY MOUTH DAILY 30 tablet 0  . cetirizine (ZYRTEC) 10 MG tablet Take 1 tablet (10 mg total) by mouth at bedtime. 30 tablet 11  . montelukast (SINGULAIR) 10 MG tablet Take 1 tablet (10 mg total) by mouth at bedtime. 30 tablet 3  . Olopatadine HCl (PATADAY) 0.2 % SOLN Apply 1 drop to eye daily. 2.5 mL 1   No current facility-administered medications on file prior to visit.    Allergies  Allergen Reactions  . 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 Diabetes Mother   . Heart disease Mother   . Hypertension Mother   . Stroke Mother   . Cancer Father   . Hypertension Father   . Stroke Sister    Social History   Socioeconomic History  . Marital status: Widowed    Spouse name: Not on file  . Number of children: Not on file  . Years of education: Not on file  . Highest education level: Not on file  Occupational History  . Not on file  Social Needs  . Financial resource strain: Not on file  . Food insecurity:    Worry: Not  on file    Inability: Not on file  . Transportation needs:    Medical: Not on file    Non-medical: Not on file  Tobacco Use  . Smoking status: Never Smoker  . Smokeless tobacco: Never Used  Substance and Sexual Activity  . Alcohol use: Yes    Comment: occasional  . Drug use: No  . Sexual activity: Not on file  Lifestyle  . Physical activity:    Days per week: Not on file    Minutes per session: Not on file  . Stress: Not on file  Relationships  . Social connections:     Talks on phone: Not on file    Gets together: Not on file    Attends religious service: Not on file    Active member of club or organization: Not on file    Attends meetings of clubs or organizations: Not on file    Relationship status: Not on file  Other Topics Concern  . 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   Cardiovascular: Negative for chest pain, palpitations and leg swelling.  Genitourinary: Positive for vaginal discharge. Negative for vaginal bleeding.  Allergic/Immunologic: Positive for environmental allergies.  Neurological: Negative for dizziness, light-headedness and headaches.      Objective:   Physical Exam  Constitutional: She is oriented to person, place, and time. She appears well-developed and well-nourished. No distress.  HENT:  Head: Normocephalic and atraumatic.  Right Ear: Tympanic membrane is erythematous. A middle ear effusion (small) is present.  Left Ear: Tympanic membrane is erythematous. A middle ear effusion (small) is present.  Ears: Canals with flaking scale.  Eyes: Conjunctivae and EOM are normal.  Neck: Neck supple. No tracheal deviation present.  Cardiovascular: Normal rate.  Pulses:      Dorsalis pedis pulses are 2+ on the right side, and 2+ on the left side.  Pulmonary/Chest: Effort normal. No respiratory distress.  Normal breast exam.  Genitourinary: Uterus is tender. Vaginal discharge  found.  Genitourinary Comments: Chaperone present. Uterus was very anteverted. Small to moderate amount of grey thick odiferous vaginal discharge. Unable to palpate cervix due to anteversion of uterus. Uterine tenderness. Possibly enlarged and fixed though difficult to acces due to body habitus.  Musculoskeletal: Normal range of motion. She exhibits no edema.  Neurological: She is alert and oriented to person, place, and time.  Skin: Skin is warm and dry.  Psychiatric: She has a normal mood and affect. Her behavior is normal.  Nursing note and vitals reviewed.  BP 125/82   Pulse 80   Temp 98.2 F (36.8 C)   Resp 16   Ht 5' 2.5" (1.588 m)   Wt 188 lb 12.8 oz (85.6 kg)  SpO2 98%   BMI 33.98 kg/m     EKG: NSR, no acute ischemic changes noted. Flipped T-waves in VIII but otherwise unchanged. No significant change noted when compared to prior EKG done on 11/15/15. I have personally reviewed the EKG tracing and agree with the computer interpretation. Assessment & Plan:   1. Annual physical exam   2. Encounter for screening mammogram for breast cancer   3. Screening for cardiovascular, respiratory, and genitourinary diseases   4. Screening for cervical cancer   5. Routine screening for STI (sexually transmitted infection)   6. Screening for deficiency anemia   7. Screening for thyroid disorder   8. Essential hypertension   9. Vitamin D deficiency   10. Seasonal allergies   11. Iron deficiency   12. Class 1 obesity due to excess calories with serious comorbidity and body mass index (BMI) of 34.0 to 34.9 in adult   13. Vaginal discharge   14. Dysmenorrhea   15. Menorrhagia with regular cycle   16. Enlarged uterus     Orders Placed This Encounter  Procedures  . US Pelvis Complete    Standing Status:   Future    Number of Occurrences:   1    Standing Expiration Date:   05/04/2019    Order Specific Question:   Reason for Exam (SYMPTOM  OR DIAGNOSIS REQUIRED)    Answer:    dysmenorrhea and menorrhagia    Order Specific Question:   Preferred imaging location?    Answer:   [facility]  . US Transvaginal Non-OB    Standing Status:   Future    Number of Occurrences:   1    Standing Expiration Date:   05/04/2019    Order Specific Question:   Reason for Exam (SYMPTOM  OR DIAGNOSIS REQUIRED)    Answer:   dysmenorrhea and menorrhagia    Order Specific Question:   Preferred imaging location?    Answer:   [facility]  . Lipid panel    Order Specific Question:   Has the patient fasted?    Answer:   Yes  . TSH  . CBC  . Comprehensive metabolic panel    Order Specific Question:   Has the patient fasted?    Answer:   Yes  . Hemoglobin A1c  . VITAMIN D 25 Hydroxy (Vit-D Deficiency, Fractures)  . Iron, TIBC and Ferritin Panel  . POCT urinalysis dipstick  . POCT Wet + KOH Prep  . EKG 12-Lead    Meds ordered this encounter  Medications  . Olopatadine HCl (PATADAY) 0.2 % SOLN    Sig: Apply 1 drop to eye daily.    Dispense:  2.5 mL    Refill:  5  . montelukast (SINGULAIR) 10 MG tablet    Sig: Take 1 tablet (10 mg total) by mouth at bedtime.    Dispense:  90 tablet    Refill:  3  . amLODipine-valsartan (EXFORGE) 10-320 MG tablet    Sig: Take 1 tablet by mouth daily.    Dispense:  90 tablet    Refill:  3  . cetirizine (ZYRTEC) 10 MG tablet    Sig: Take 1 tablet (10 mg total) by mouth at bedtime.    Dispense:  90 tablet    Refill:  3  . tinidazole (TINDAMAX) 500 MG tablet    Sig: Take 4 tablets (2,000 mg total) by mouth daily with breakfast.    Dispense:  8 tablet    Refill:  0  I personally performed the services described in this documentation, which was scribed in my presence. The recorded information has been reviewed and considered, and addended by me as needed.   Pamela Allen, M.D.  Primary Care at [facility] [address] [place], [place] [address] [phone] phone [phone] fax  03/15/18 6:58 PM

## 2018-03-03 NOTE — Patient Instructions (Addendum)
IF you received an x-ray today, you will receive an invoice from [facility]. Please contact [facility] at [phone] with questions or concerns regarding your invoice.   IF you received labwork today, you will receive an invoice from [facility]. Please contact [facility] at [phone] with questions or concerns regarding your invoice.   Our billing staff will not be able to assist you with questions regarding bills from these companies.  You will be contacted with the lab results as soon as they are available. The fastest way to get your results is to activate your My Chart account. Instructions are located on the last page of this paperwork. If you have not heard from [place] regarding the results in 2 weeks, please contact this office.    Bacterial Vaginosis Bacterial vaginosis is a vaginal infection that occurs when the normal balance of bacteria in the vagina is disrupted. It results from an overgrowth of certain bacteria. This is the most common vaginal infection among women ages 41-44. Because bacterial vaginosis increases your risk for STIs (sexually transmitted infections), getting treated can help reduce your risk for chlamydia, gonorrhea, herpes, and HIV (human immunodeficiency virus). Treatment is also important for preventing complications in pregnant women, because this condition can cause an early (premature) delivery. What are the causes? This condition is caused by an increase in harmful bacteria that are normally present in small amounts in the vagina. However, the reason that the condition develops is not fully understood. What increases the risk? The following factors may make you more likely to develop this condition:  Having a new sexual partner or multiple sexual partners.  Having unprotected sex.  Douching.  Having an intrauterine device (IUD).  Smoking.  Drug and alcohol abuse.  Taking certain antibiotic medicines.  Being pregnant.  You  cannot get bacterial vaginosis from toilet seats, bedding, swimming pools, or contact with objects around you. What are the signs or symptoms? Symptoms of this condition include:  Grey or white vaginal discharge. The discharge can also be watery or foamy.  A fish-like odor with discharge, especially after sexual intercourse or during menstruation.  Itching in and around the vagina.  Burning or pain with urination.  Some women with bacterial vaginosis have no signs or symptoms. How is this diagnosed? This condition is diagnosed based on:  Your medical history.  A physical exam of the vagina.  Testing a sample of vaginal fluid under a microscope to look for a large amount of bad bacteria or abnormal cells. Your health care provider may use a cotton swab or a small wooden spatula to collect the sample.  How is this treated? This condition is treated with antibiotics. These may be given as a pill, a vaginal cream, or a medicine that is put into the vagina (suppository). If the condition comes back after treatment, a second round of antibiotics may be needed. Follow these instructions at home: Medicines  Take over-the-counter and prescription medicines only as told by your health care provider.  Take or use your antibiotic as told by your health care provider. Do not stop taking or using the antibiotic even if you start to feel better. General instructions  If you have a female sexual partner, tell her that you have a vaginal infection. She should see her health care provider and be treated if she has symptoms. If you have a female sexual partner, he does not need treatment.  During treatment: ? Avoid sexual activity until you finish treatment. ?  Do not douche. ? Avoid alcohol as directed by your health care provider. ? Avoid breastfeeding as directed by your health care provider.  Drink enough water and fluids to keep your urine clear or pale yellow.  Keep the area around your  vagina and rectum clean. ? Wash the area daily with warm water. ? Wipe yourself from front to back after using the toilet.  Keep all follow-up visits as told by your health care provider. This is important. How is this prevented?  Do not douche.  Wash the outside of your vagina with warm water only.  Use protection when having sex. This includes latex condoms and dental dams.  Limit how many sexual partners you have. To help prevent bacterial vaginosis, it is best to have sex with just one partner (monogamous).  Make sure you and your sexual partner are tested for STIs.  Wear cotton or cotton-lined underwear.  Avoid wearing tight pants and pantyhose, especially during summer.  Limit the amount of alcohol that you drink.  Do not use any products that contain nicotine or tobacco, such as cigarettes and e-cigarettes. If you need help quitting, ask your health care provider.  Do not use illegal drugs. Where to find more information:  Centers for Disease Control and Prevention: AppraiserFraud.fi  American Sexual Health Association (ASHA): www.ashastd.org  [place].S. Department of Health and Financial controller, Office on Women's Health: DustingSprays.pl or SecuritiesCard.it Contact a health care provider if:  Your symptoms do not improve, even after treatment.  You have more discharge or pain when urinating.  You have a fever.  You have pain in your abdomen.  You have pain during sex.  You have vaginal bleeding between periods. Summary  Bacterial vaginosis is a vaginal infection that occurs when the normal balance of bacteria in the vagina is disrupted.  Because bacterial vaginosis increases your risk for STIs (sexually transmitted infections), getting treated can help reduce your risk for chlamydia, gonorrhea, herpes, and HIV (human immunodeficiency virus). Treatment is also important for preventing complications in pregnant women,  because the condition can cause an early (premature) delivery.  This condition is treated with antibiotic medicines. These may be given as a pill, a vaginal cream, or a medicine that is put into the vagina (suppository). This information is not intended to replace advice given to you by your health care provider. Make sure you discuss any questions you have with your health care provider. Document Released: 09/01/2005 Document Revised: 01/05/2017 Document Reviewed: 05/17/2016 Elsevier Interactive Patient Education  2018 [place] Maintenance, Female Adopting a healthy lifestyle and getting preventive care can go a long way to promote health and wellness. Talk with your health care provider about what schedule of regular examinations is right for you. This is a good chance for you to check in with your provider about disease prevention and staying healthy. In between checkups, there are plenty of things you can do on your own. Experts have done a lot of research about which lifestyle changes and preventive measures are most likely to keep you healthy. Ask your health care provider for more information. Weight and diet Eat a healthy diet  Be sure to include plenty of vegetables, fruits, low-fat dairy products, and lean protein.  Do not eat a lot of foods high in solid fats, added sugars, or salt.  Get regular exercise. This is one of the most important things you can do for your health. ? Most adults should exercise for at least 150  minutes each week. The exercise should increase your heart rate and make you sweat (moderate-intensity exercise). ? Most adults should also do strengthening exercises at least twice a week. This is in addition to the moderate-intensity exercise.  Maintain a healthy weight  Body mass index (BMI) is a measurement that can be used to identify possible weight problems. It estimates body fat based on height and weight. Your health care provider can help  determine your BMI and help you achieve or maintain a healthy weight.  For females 74 years of age and older: ? A BMI below 18.5 is considered underweight. ? A BMI of 18.5 to 24.9 is normal. ? A BMI of 25 to 29.9 is considered overweight. ? A BMI of 30 and above is considered obese.  Watch levels of cholesterol and blood lipids  You should start having your blood tested for lipids and cholesterol at 43 years of age, then have this test every 5 years.  You may need to have your cholesterol levels checked more often if: ? Your lipid or cholesterol levels are high. ? You are older than 43 years of age. ? You are at high risk for heart disease.  Cancer screening Lung Cancer  Lung cancer screening is recommended for adults 62-76 years old who are at high risk for lung cancer because of a history of smoking.  A yearly low-dose CT scan of the lungs is recommended for people who: ? Currently smoke. ? Have quit within the past 15 years. ? Have at least a 30-pack-year history of smoking. A pack year is smoking an average of one pack of cigarettes a day for 1 year.  Yearly screening should continue until it has been 15 years since you quit.  Yearly screening should stop if you develop a health problem that would prevent you from having lung cancer treatment.  Breast Cancer  Practice breast self-awareness. This means understanding how your breasts normally appear and feel.  It also means doing regular breast self-exams. Let your health care provider know about any changes, no matter how small.  If you are in your 20s or 30s, you should have a clinical breast exam (CBE) by a health care provider every 1-3 years as part of a regular health exam.  If you are 34 or older, have a CBE every year. Also consider having a breast X-ray (mammogram) every year.  If you have a family history of breast cancer, talk to your health care provider about genetic screening.  If you are at high risk for  breast cancer, talk to your health care provider about having an MRI and a mammogram every year.  Breast cancer gene (BRCA) assessment is recommended for women who have family members with BRCA-related cancers. BRCA-related cancers include: ? Breast. ? Ovarian. ? Tubal. ? Peritoneal cancers.  Results of the assessment will determine the need for genetic counseling and BRCA1 and BRCA2 testing.  Cervical Cancer Your health care provider may recommend that you be screened regularly for cancer of the pelvic organs (ovaries, uterus, and vagina). This screening involves a pelvic examination, including checking for microscopic changes to the surface of your cervix (Pap test). You may be encouraged to have this screening done every 3 years, beginning at age 27.  For women ages 82-65, health care providers may recommend pelvic exams and Pap testing every 3 years, or they may recommend the Pap and pelvic exam, combined with testing for human papilloma virus (HPV), every 5 years. Some types  of HPV increase your risk of cervical cancer. Testing for HPV may also be done on women of any age with unclear Pap test results.  Other health care providers may not recommend any screening for nonpregnant women who are considered low risk for pelvic cancer and who do not have symptoms. Ask your health care provider if a screening pelvic exam is right for you.  If you have had past treatment for cervical cancer or a condition that could lead to cancer, you need Pap tests and screening for cancer for at least 20 years after your treatment. If Pap tests have been discontinued, your risk factors (such as having a new sexual partner) need to be reassessed to determine if screening should resume. Some women have medical problems that increase the chance of getting cervical cancer. In these cases, your health care provider may recommend more frequent screening and Pap tests.  Colorectal Cancer  This type of cancer can be  detected and often prevented.  Routine colorectal cancer screening usually begins at 43 years of age and continues through 43 years of age.  Your health care provider may recommend screening at an earlier age if you have risk factors for colon cancer.  Your health care provider may also recommend using home test kits to check for hidden blood in the stool.  A small camera at the end of a tube can be used to examine your colon directly (sigmoidoscopy or colonoscopy). This is done to check for the earliest forms of colorectal cancer.  Routine screening usually begins at age 90.  Direct examination of the colon should be repeated every 5-10 years through 43 years of age. However, you may need to be screened more often if early forms of precancerous polyps or small growths are found.  Skin Cancer  Check your skin from head to toe regularly.  Tell your health care provider about any new moles or changes in moles, especially if there is a change in a mole's shape or color.  Also tell your health care provider if you have a mole that is larger than the size of a pencil eraser.  Always use sunscreen. Apply sunscreen liberally and repeatedly throughout the day.  Protect yourself by wearing long sleeves, pants, a wide-brimmed hat, and sunglasses whenever you are outside.  Heart disease, diabetes, and high blood pressure  High blood pressure causes heart disease and increases the risk of stroke. High blood pressure is more likely to develop in: ? People who have blood pressure in the high end of the normal range (130-139/85-89 mm Hg). ? People who are overweight or obese. ? People who are African American.  If you are 75-14 years of age, have your blood pressure checked every 3-5 years. If you are 29 years of age or older, have your blood pressure checked every year. You should have your blood pressure measured twice-once when you are at a hospital or clinic, and once when you are not at a  hospital or clinic. Record the average of the two measurements. To check your blood pressure when you are not at a hospital or clinic, you can use: ? An automated blood pressure machine at a pharmacy. ? A home blood pressure monitor.  If you are between 100 years and 32 years old, ask your health care provider if you should take aspirin to prevent strokes.  Have regular diabetes screenings. This involves taking a blood sample to check your fasting blood sugar level. ? If you are  at a normal weight and have a low risk for diabetes, have this test once every three years after 43 years of age. ? If you are overweight and have a high risk for diabetes, consider being tested at a younger age or more often. Preventing infection Hepatitis B  If you have a higher risk for hepatitis B, you should be screened for this virus. You are considered at high risk for hepatitis B if: ? You were born in a country where hepatitis B is common. Ask your health care provider which countries are considered high risk. ? Your parents were born in a high-risk country, and you have not been immunized against hepatitis B (hepatitis B vaccine). ? You have HIV or AIDS. ? You use needles to inject street drugs. ? You live with someone who has hepatitis B. ? You have had sex with someone who has hepatitis B. ? You get hemodialysis treatment. ? You take certain medicines for conditions, including cancer, organ transplantation, and autoimmune conditions.  Hepatitis C  Blood testing is recommended for: ? Everyone born from [birth year] through 1966. ? Anyone with known risk factors for hepatitis C.  Sexually transmitted infections (STIs)  You should be screened for sexually transmitted infections (STIs) including gonorrhea and chlamydia if: ? You are sexually active and are younger than 43 years of age. ? You are older than 43 years of age and your health care provider tells you that you are at risk for this type of  infection. ? Your sexual activity has changed since you were last screened and you are at an increased risk for chlamydia or gonorrhea. Ask your health care provider if you are at risk.  If you do not have HIV, but are at risk, it may be recommended that you take a prescription medicine daily to prevent HIV infection. This is called pre-exposure prophylaxis (PrEP). You are considered at risk if: ? You are sexually active and do not regularly use condoms or know the HIV status of your partner(s). ? You take drugs by injection. ? You are sexually active with a partner who has HIV.  Talk with your health care provider about whether you are at high risk of being infected with HIV. If you choose to begin PrEP, you should first be tested for HIV. You should then be tested every 3 months for as long as you are taking PrEP. Pregnancy  If you are premenopausal and you may become pregnant, ask your health care provider about preconception counseling.  If you may become pregnant, take 400 to 800 micrograms (mcg) of folic acid every day.  If you want to prevent pregnancy, talk to your health care provider about birth control (contraception). Osteoporosis and menopause  Osteoporosis is a disease in which the bones lose minerals and strength with aging. This can result in serious bone fractures. Your risk for osteoporosis can be identified using a bone density scan.  If you are 92 years of age or older, or if you are at risk for osteoporosis and fractures, ask your health care provider if you should be screened.  Ask your health care provider whether you should take a calcium or vitamin D supplement to lower your risk for osteoporosis.  Menopause may have certain physical symptoms and risks.  Hormone replacement therapy may reduce some of these symptoms and risks. Talk to your health care provider about whether hormone replacement therapy is right for you. Follow these instructions at home:  Schedule  regular health,  dental, and eye exams.  Stay current with your immunizations.  Do not use any tobacco products including cigarettes, chewing tobacco, or electronic cigarettes.  If you are pregnant, do not drink alcohol.  If you are breastfeeding, limit how much and how often you drink alcohol.  Limit alcohol intake to no more than 1 drink per day for nonpregnant women. One drink equals 12 ounces of beer, 5 ounces of wine, or 1 ounces of hard liquor.  Do not use street drugs.  Do not share needles.  Ask your health care provider for help if you need support or information about quitting drugs.  Tell your health care provider if you often feel depressed.  Tell your health care provider if you have ever been abused or do not feel safe at home. This information is not intended to replace advice given to you by your health care provider. Make sure you discuss any questions you have with your health care provider. Document Released: 03/17/2011 Document Revised: 02/07/2016 Document Reviewed: 06/05/2015 Elsevier Interactive Patient Education  [employer].

## 2018-03-04 LAB — COMPREHENSIVE METABOLIC PANEL
ALT: 19 IU/L (ref 0–32)
AST: 24 IU/L (ref 0–40)
Albumin/Globulin Ratio: 1.4 (ref 1.2–2.2)
Albumin: 4.1 g/dL (ref 3.5–5.5)
Alkaline Phosphatase: 47 IU/L (ref 39–117)
BUN/Creatinine Ratio: 10 (ref 9–23)
BUN: 10 mg/dL (ref 6–24)
Bilirubin Total: 0.3 mg/dL (ref 0.0–1.2)
CALCIUM: 8.8 mg/dL (ref 8.7–10.2)
CO2: 19 mmol/L — AB (ref 20–29)
CREATININE: 1.02 mg/dL — AB (ref 0.57–1.00)
Chloride: 104 mmol/L (ref 96–106)
GFR, EST AFRICAN AMERICAN: 78 mL/min/{1.73_m2} (ref >59)
GFR, EST NON AFRICAN AMERICAN: 68 mL/min/{1.73_m2} (ref >59)
GLOBULIN, TOTAL: 2.9 g/dL (ref 1.5–4.5)
Glucose: 106 mg/dL — ABNORMAL HIGH (ref 65–99)
Potassium: 4.1 mmol/L (ref 3.5–5.2)
Sodium: 139 mmol/L (ref 134–144)
TOTAL PROTEIN: 7 g/dL (ref 6.0–8.5)

## 2018-03-04 LAB — CBC
HEMATOCRIT: 35 % (ref 34.0–46.6)
Hemoglobin: 11.3 g/dL (ref 11.1–15.9)
MCH: 29.1 pg (ref 26.6–33.0)
MCHC: 32.3 g/dL (ref 31.5–35.7)
MCV: 90 fL (ref 79–97)
PLATELETS: 302 10*3/uL (ref 150–450)
RBC: 3.88 x10E6/uL (ref 3.77–5.28)
RDW: 13.6 % (ref 12.3–15.4)
WBC: 6.7 10*3/uL (ref 3.4–10.8)

## 2018-03-04 LAB — HEMOGLOBIN A1C
ESTIMATED AVERAGE GLUCOSE: 94 mg/dL
Hgb A1c MFr Bld: 4.9 % (ref 4.8–5.6)

## 2018-03-04 LAB — IRON,TIBC AND FERRITIN PANEL
Ferritin: 13 ng/mL — ABNORMAL LOW (ref 15–150)
Iron Saturation: 12 % — ABNORMAL LOW (ref 15–55)
Iron: 37 ug/dL (ref 27–159)
Total Iron Binding Capacity: 309 ug/dL (ref 250–450)
UIBC: 272 ug/dL (ref 131–425)

## 2018-03-04 LAB — TSH: TSH: 1.62 u[IU]/mL (ref 0.450–4.500)

## 2018-03-04 LAB — VITAMIN D 25 HYDROXY (VIT D DEFICIENCY, FRACTURES): VIT D 25 HYDROXY: 45.1 ng/mL (ref 30.0–100.0)

## 2018-03-09 LAB — PAP IG, CT-NG NAA, HPV HIGH-RISK
CHLAMYDIA, NUC. ACID AMP: NEGATIVE
GONOCOCCUS BY NUCLEIC ACID AMP: NEGATIVE
HPV, HIGH-RISK: NEGATIVE
PAP Smear Comment: 0

## 2018-03-15 ENCOUNTER — Ambulatory Visit (HOSPITAL_COMMUNITY)
Admission: RE | Admit: 2018-03-15 | Discharge: 2018-03-15 | Disposition: A | Discharge status: Home / Self Care | Payer: 59 | Source: Ambulatory Visit | Attending: Family Medicine | Admitting: Family Medicine

## 2018-03-15 DIAGNOSIS — N852 Hypertrophy of uterus: Secondary | ICD-10-CM | POA: Diagnosis present

## 2018-03-15 DIAGNOSIS — D259 Leiomyoma of uterus, unspecified: Secondary | ICD-10-CM | POA: Insufficient documentation

## 2018-03-15 DIAGNOSIS — N920 Excessive and frequent menstruation with regular cycle: Secondary | ICD-10-CM | POA: Diagnosis present

## 2018-03-15 DIAGNOSIS — N946 Dysmenorrhea, unspecified: Secondary | ICD-10-CM | POA: Diagnosis present

## 2019-03-11 ENCOUNTER — Ambulatory Visit
Admission: RE | Admit: 2019-03-11 | Discharge: 2019-03-11 | Disposition: A | Discharge status: Home / Self Care | Payer: 59 | Source: Ambulatory Visit

## 2019-03-11 ENCOUNTER — Other Ambulatory Visit: Payer: Self-pay | Admitting: Family Medicine

## 2019-03-11 DIAGNOSIS — Z1231 Encounter for screening mammogram for malignant neoplasm of breast: Secondary | ICD-10-CM

## 2019-03-23 ENCOUNTER — Ambulatory Visit (INDEPENDENT_AMBULATORY_CARE_PROVIDER_SITE_OTHER): Payer: 59 | Admitting: Emergency Medicine

## 2019-03-23 ENCOUNTER — Other Ambulatory Visit: Payer: Self-pay

## 2019-03-23 ENCOUNTER — Encounter: Payer: Self-pay | Admitting: Emergency Medicine

## 2019-03-23 VITALS — BP 114/75 | HR 71 | Temp 98.8°F | Resp 16 | Ht 63.0 in | Wt 190.6 lb

## 2019-03-23 DIAGNOSIS — I10 Essential (primary) hypertension: Secondary | ICD-10-CM | POA: Diagnosis not present

## 2019-03-23 DIAGNOSIS — E559 Vitamin D deficiency, unspecified: Secondary | ICD-10-CM

## 2019-03-23 DIAGNOSIS — Z7689 Persons encountering health services in other specified circumstances: Secondary | ICD-10-CM | POA: Diagnosis not present

## 2019-03-23 DIAGNOSIS — J302 Other seasonal allergic rhinitis: Secondary | ICD-10-CM | POA: Diagnosis not present

## 2019-03-23 MED ORDER — AMLODIPINE BESYLATE-VALSARTAN 10-320 MG PO TABS: 1.0000 | ORAL_TABLET | Freq: Every day | ORAL | 3 refills | Status: DC

## 2019-03-23 MED ORDER — OLOPATADINE HCL 0.2 % OP SOLN: 1.0000 [drp] | Freq: Every day | OPHTHALMIC | 5 refills | Status: AC

## 2019-03-23 MED ORDER — MONTELUKAST SODIUM 10 MG PO TABS: 10.0000 mg | ORAL_TABLET | Freq: Every day | ORAL | 3 refills | Status: DC

## 2019-03-23 MED ORDER — TINIDAZOLE 500 MG PO TABS: 2.0000 g | ORAL_TABLET | Freq: Every day | ORAL | 0 refills | Status: DC

## 2019-03-23 MED ORDER — CETIRIZINE HCL 10 MG PO TABS: 10.0000 mg | ORAL_TABLET | Freq: Every day | ORAL | 3 refills | Status: DC

## 2019-03-23 NOTE — Progress Notes (Signed)
Pamela Allen 43 y.o.   Chief Complaint  Patient presents with  . Establish Care  . Medication Refill    ALL medications    HISTORY OF PRESENT ILLNESS: This is a 43 y.o. female here to establish care with me.  Used to see Dr. Brigitte Pulse.  Has the following medical problems: 1.  Essential hypertension: On Exforge 10-320 daily.  Doing well.  No complaints. 2.  Chronic seasonal allergies: On Zyrtec, Singulair and olopatadine eyedrops. Has no complaints or medical concerns today.  Needs medication refills. HPI   Prior to Admission medications   Medication Sig Start Date End Date Taking? Authorizing Provider  amLODipine-valsartan (EXFORGE) 10-320 MG tablet Take 1 tablet by mouth daily. 03/23/19  Yes Jamilia Jacques, Ines Bloomer, MD  cetirizine (ZYRTEC) 10 MG tablet Take 1 tablet (10 mg total) by mouth at bedtime. 03/23/19  Yes Bani Gianfrancesco, Ines Bloomer, MD  montelukast (SINGULAIR) 10 MG tablet Take 1 tablet (10 mg total) by mouth at bedtime. 03/23/19  Yes Wiletta Bermingham, Ines Bloomer, MD  Olopatadine HCl (PATADAY) 0.2 % SOLN Apply 1 drop to eye daily. 03/23/19  Yes Horald Pollen, MD  tinidazole [facility]) 500 MG tablet Take 4 tablets (2,000 mg total) by mouth daily with breakfast. 03/23/19  Yes Bentley Fissel, Ines Bloomer, MD    Allergies  Allergen Reactions  . 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 Iron deficiency 03/02/2018  . Class 1 obesity due to excess calories with serious comorbidity and body mass index (BMI) of 34.0 to 34.9 in adult 03/02/2018  . Seasonal allergies 01/02/2018  . 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 Vitamin D deficiency 11/11/2014  . HTN (hypertension) 11/21/2011    Past Medical History:  Diagnosis Date  . Allergy   . Hypertension   . Mass    left buttock    Past Surgical History:  Procedure Laterality Date  . BREAST EXCISIONAL BIOPSY     right US guided  . MASS EXCISION Left 01/17/2016   Procedure: EXCISION BIOPSY  OF MASS LEFT BUTTOCK ;  Surgeon: Leighton Ruff, MD;  Location: [place];  Service: General;  Laterality: Left;  . TUBAL LIGATION Bilateral 10-30-2003   w/ Bilateral Salpingectomy    Social History   Socioeconomic History  . Marital status: Married    Spouse name: Not on file  . Number of children: Not on file  . Years of education: Not on file  . Highest education level: Not on file  Occupational History  . Not on file  Social Needs  . Financial resource strain: Not on file  . Food insecurity    Worry: Not on file    Inability: Not on file  . Transportation needs    Medical: Not on file    Non-medical: Not on file  Tobacco Use  . Smoking status: Never Smoker  . Smokeless tobacco: Never Used  Substance and Sexual Activity  . Alcohol use: Yes    Comment: occasional  . Drug use: No  . Sexual activity: Not on file  Lifestyle  . Physical activity    Days per week: Not on file    Minutes per session: Not on file  . Stress: Not on file  Relationships  . Social Herbalist on phone: Not on file    Gets together: Not on file    Attends religious service: Not on file  Active member of club or organization: Not on file    Attends meetings of clubs or organizations: Not on file    Relationship status: Not on file  . Intimate partner violence    Fear of current or ex partner: Not on file    Emotionally abused: Not on file    Physically abused: Not on file    Forced sexual activity: Not on file  Other Topics Concern  . Not on file  Social History Narrative   Married   Cytogeneticist / CNA    Family History  Problem Relation Age of Onset  . Diabetes Mother   . Heart disease Mother   . Hypertension Mother   . Stroke Mother   . Cancer Father   . Hypertension Father   . Stroke Sister      Review of Systems  Constitutional: Negative.  Negative for fever.  HENT: Negative.  Negative for congestion, hearing loss and sore  throat.   Eyes: Negative.  Negative for blurred vision and double vision.  Respiratory: Negative.  Negative for cough and shortness of breath.   Cardiovascular: Negative.  Negative for chest pain and palpitations.  Gastrointestinal: Negative.  Negative for abdominal pain, diarrhea, nausea and vomiting.  Genitourinary: Negative.   Skin: Negative.  Negative for rash.  Neurological: Negative for dizziness and headaches.  Endo/Heme/Allergies: Positive for environmental allergies.  All other systems reviewed and are negative.  Vitals:   03/23/19 1420  BP: 114/75  Pulse: 71  Resp: 16  Temp: 98.8 F (37.1 C)  SpO2: 100%     Physical Exam Vitals signs reviewed.  Constitutional:      Appearance: Normal appearance.  HENT:     Head: Normocephalic and atraumatic.  Eyes:     Extraocular Movements: Extraocular movements intact.     Conjunctiva/sclera: Conjunctivae normal.     Pupils: Pupils are equal, round, and reactive to light.  Neck:     Musculoskeletal: Normal range of motion and neck supple.  Cardiovascular:     Rate and Rhythm: Normal rate and regular rhythm.     Pulses: Normal pulses.     Heart sounds: Normal heart sounds.  Pulmonary:     Effort: Pulmonary effort is normal.     Breath sounds: Normal breath sounds.  Musculoskeletal: Normal range of motion.     Right lower leg: No edema.     Left lower leg: No edema.  Skin:    General: Skin is warm and dry.     Capillary Refill: Capillary refill takes less than 2 seconds.  Neurological:     General: No focal deficit present.     Mental Status: She is alert and oriented to person, place, and time.  Psychiatric:        Mood and Affect: Mood normal.        Behavior: Behavior normal.      ASSESSMENT & PLAN: [name] was seen today for establish care and medication refill.  Diagnoses and all orders for this visit:  Essential hypertension -     amLODipine-valsartan (EXFORGE) 10-320 MG tablet; Take 1 tablet by mouth  daily. -     CBC with Differential/Platelet -     Comprehensive metabolic panel -     Lipid panel -     Hemoglobin A1c  Vitamin D deficiency  Seasonal allergies -     cetirizine (ZYRTEC) 10 MG tablet; Take 1 tablet (10 mg total) by mouth at bedtime. -  montelukast (SINGULAIR) 10 MG tablet; Take 1 tablet (10 mg total) by mouth at bedtime. -     Olopatadine HCl (PATADAY) 0.2 % SOLN; Apply 1 drop to eye daily.  Encounter to establish care  Other orders -     tinidazole (TINDAMAX) 500 MG tablet; Take 4 tablets (2,000 mg total) by mouth daily with breakfast.    Patient Instructions       If you have lab work done today you will be contacted with your lab results within the next 2 weeks.  If you have not heard from [place] then please contact us. The fastest way to get your results is to register for My Chart.   IF you received an x-ray today, you will receive an invoice from [facility]. Please contact [facility] at [phone] with questions or concerns regarding your invoice.   IF you received labwork today, you will receive an invoice from [facility]. Please contact [facility] at [phone] with questions or concerns regarding your invoice.   Our billing staff will not be able to assist you with questions regarding bills from these companies.  You will be contacted with the lab results as soon as they are available. The fastest way to get your results is to activate your My Chart account. Instructions are located on the last page of this paperwork. If you have not heard from [place] regarding the results in 2 weeks, please contact this office.     Hypertension, Adult High blood pressure (hypertension) is when the force of blood pumping through the arteries is too strong. The arteries are the blood vessels that carry blood from the heart throughout the body. Hypertension forces the heart to work harder to pump blood and may cause arteries to become narrow or stiff.  Untreated or uncontrolled hypertension can cause a heart attack, heart failure, a stroke, kidney disease, and other problems. A blood pressure reading consists of a higher number over a lower number. Ideally, your blood pressure should be below 120/80. The first ("top") number is called the systolic pressure. It is a measure of the pressure in your arteries as your heart beats. The second ("bottom") number is called the diastolic pressure. It is a measure of the pressure in your arteries as the heart relaxes. What are the causes? The exact cause of this condition is not known. There are some conditions that result in or are related to high blood pressure. What increases the risk? Some risk factors for high blood pressure are under your control. The following factors may make you more likely to develop this condition:  Smoking.  Having type 2 diabetes mellitus, high cholesterol, or both.  Not getting enough exercise or physical activity.  Being overweight.  Having too much fat, sugar, calories, or salt (sodium) in your diet.  Drinking too much alcohol. Some risk factors for high blood pressure may be difficult or impossible to change. Some of these factors include:  Having chronic kidney disease.  Having a family history of high blood pressure.  Age. Risk increases with age.  Race. You may be at higher risk if you are African American.  Gender. Men are at higher risk than women before age 53. After age 10, women are at higher risk than men.  Having obstructive sleep apnea.  Stress. What are the signs or symptoms? High blood pressure may not cause symptoms. Very high blood pressure (hypertensive crisis) may cause:  Headache.  Anxiety.  Shortness of breath.  Nosebleed.  Nausea and  vomiting.  Vision changes.  Severe chest pain.  Seizures. How is this diagnosed? This condition is diagnosed by measuring your blood pressure while you are seated, with your arm resting on a  flat surface, your legs uncrossed, and your feet flat on the floor. The cuff of the blood pressure monitor will be placed directly against the skin of your upper arm at the level of your heart. It should be measured at least twice using the same arm. Certain conditions can cause a difference in blood pressure between your right and left arms. Certain factors can cause blood pressure readings to be lower or higher than normal for a short period of time:  When your blood pressure is higher when you are in a health care provider's office than when you are at home, this is called white coat hypertension. Most people with this condition do not need medicines.  When your blood pressure is higher at home than when you are in a health care provider's office, this is called masked hypertension. Most people with this condition may need medicines to control blood pressure. If you have a high blood pressure reading during one visit or you have normal blood pressure with other risk factors, you may be asked to:  Return on a different day to have your blood pressure checked again.  Monitor your blood pressure at home for 1 week or longer. If you are diagnosed with hypertension, you may have other blood or imaging tests to help your health care provider understand your overall risk for other conditions. How is this treated? This condition is treated by making healthy lifestyle changes, such as eating healthy foods, exercising more, and reducing your alcohol intake. Your health care provider may prescribe medicine if lifestyle changes are not enough to get your blood pressure under control, and if:  Your systolic blood pressure is above 130.  Your diastolic blood pressure is above 80. Your personal target blood pressure may vary depending on your medical conditions, your age, and other factors. Follow these instructions at home: Eating and drinking   Eat a diet that is high in fiber and potassium, and low in  sodium, added sugar, and fat. An example eating plan is called the DASH (Dietary Approaches to Stop Hypertension) diet. To eat this way: ? Eat plenty of fresh fruits and vegetables. Try to fill one half of your plate at each meal with fruits and vegetables. ? Eat whole grains, such as whole-wheat pasta, brown rice, or whole-grain bread. Fill about one fourth of your plate with whole grains. ? Eat or drink low-fat dairy products, such as skim milk or low-fat yogurt. ? Avoid fatty cuts of meat, processed or cured meats, and poultry with skin. Fill about one fourth of your plate with lean proteins, such as fish, chicken without skin, beans, eggs, or tofu. ? Avoid pre-made and processed foods. These tend to be higher in sodium, added sugar, and fat.  Reduce your daily sodium intake. Most people with hypertension should eat less than 1,500 mg of sodium a day.  Do not drink alcohol if: ? Your health care provider tells you not to drink. ? You are pregnant, may be pregnant, or are planning to become pregnant.  If you drink alcohol: ? Limit how much you use to:  0-1 drink a day for women.  0-2 drinks a day for men. ? Be aware of how much alcohol is in your drink. In the [place] one drink equals one 12 oz  bottle of beer (355 mL), one 5 oz glass of wine (148 mL), or one 1 oz glass of hard liquor (44 mL). Lifestyle   Work with your health care provider to maintain a healthy body weight or to lose weight. Ask what an ideal weight is for you.  Get at least 30 minutes of exercise most days of the week. Activities may include walking, swimming, or biking.  Include exercise to strengthen your muscles (resistance exercise), such as Pilates or lifting weights, as part of your weekly exercise routine. Try to do these types of exercises for 30 minutes at least 3 days a week.  Do not use any products that contain nicotine or tobacco, such as cigarettes, e-cigarettes, and chewing tobacco. If you need help  quitting, ask your health care provider.  Monitor your blood pressure at home as told by your health care provider.  Keep all follow-up visits as told by your health care provider. This is important. Medicines  Take over-the-counter and prescription medicines only as told by your health care provider. Follow directions carefully. Blood pressure medicines must be taken as prescribed.  Do not skip doses of blood pressure medicine. Doing this puts you at risk for problems and can make the medicine less effective.  Ask your health care provider about side effects or reactions to medicines that you should watch for. Contact a health care provider if you:  Think you are having a reaction to a medicine you are taking.  Have headaches that keep coming back (recurring).  Feel dizzy.  Have swelling in your ankles.  Have trouble with your vision. Get help right away if you:  Develop a severe headache or confusion.  Have unusual weakness or numbness.  Feel faint.  Have severe pain in your chest or abdomen.  Vomit repeatedly.  Have trouble breathing. Summary  Hypertension is when the force of blood pumping through your arteries is too strong. If this condition is not controlled, it may put you at risk for serious complications.  Your personal target blood pressure may vary depending on your medical conditions, your age, and other factors. For most people, a normal blood pressure is less than 120/80.  Hypertension is treated with lifestyle changes, medicines, or a combination of both. Lifestyle changes include losing weight, eating a healthy, low-sodium diet, exercising more, and limiting alcohol. This information is not intended to replace advice given to you by your health care provider. Make sure you discuss any questions you have with your health care provider. Document Released: 09/01/2005 Document Revised: 05/12/2018 Document Reviewed: 05/12/2018 Elsevier Patient Education  2020  [employer]       Agustina Caroli, MD [facility]

## 2019-03-23 NOTE — Patient Instructions (Addendum)
If you have lab work done today you will be contacted with your lab results within the next 2 weeks.  If you have not heard from [place] then please contact us. The fastest way to get your results is to register for My Chart.   IF you received an x-ray today, you will receive an invoice from [facility]. Please contact [facility] at [phone] with questions or concerns regarding your invoice.   IF you received labwork today, you will receive an invoice from [facility]. Please contact [facility] at [phone] with questions or concerns regarding your invoice.   Our billing staff will not be able to assist you with questions regarding bills from these companies.  You will be contacted with the lab results as soon as they are available. The fastest way to get your results is to activate your My Chart account. Instructions are located on the last page of this paperwork. If you have not heard from [place] regarding the results in 2 weeks, please contact this office.     Hypertension, Adult High blood pressure (hypertension) is when the force of blood pumping through the arteries is too strong. The arteries are the blood vessels that carry blood from the heart throughout the body. Hypertension forces the heart to work harder to pump blood and may cause arteries to become narrow or stiff. Untreated or uncontrolled hypertension can cause a heart attack, heart failure, a stroke, kidney disease, and other problems. A blood pressure reading consists of a higher number over a lower number. Ideally, your blood pressure should be below 120/80. The first ("top") number is called the systolic pressure. It is a measure of the pressure in your arteries as your heart beats. The second ("bottom") number is called the diastolic pressure. It is a measure of the pressure in your arteries as the heart relaxes. What are the causes? The exact cause of this condition is not known. There are some conditions  that result in or are related to high blood pressure. What increases the risk? Some risk factors for high blood pressure are under your control. The following factors may make you more likely to develop this condition:  Smoking.  Having type 2 diabetes mellitus, high cholesterol, or both.  Not getting enough exercise or physical activity.  Being overweight.  Having too much fat, sugar, calories, or salt (sodium) in your diet.  Drinking too much alcohol. Some risk factors for high blood pressure may be difficult or impossible to change. Some of these factors include:  Having chronic kidney disease.  Having a family history of high blood pressure.  Age. Risk increases with age.  Race. You may be at higher risk if you are African American.  Gender. Men are at higher risk than women before age 56. After age 70, women are at higher risk than men.  Having obstructive sleep apnea.  Stress. What are the signs or symptoms? High blood pressure may not cause symptoms. Very high blood pressure (hypertensive crisis) may cause:  Headache.  Anxiety.  Shortness of breath.  Nosebleed.  Nausea and vomiting.  Vision changes.  Severe chest pain.  Seizures. How is this diagnosed? This condition is diagnosed by measuring your blood pressure while you are seated, with your arm resting on a flat surface, your legs uncrossed, and your feet flat on the floor. The cuff of the blood pressure monitor will be placed directly against the skin of your upper arm at the level of your heart.  It should be measured at least twice using the same arm. Certain conditions can cause a difference in blood pressure between your right and left arms. Certain factors can cause blood pressure readings to be lower or higher than normal for a short period of time:  When your blood pressure is higher when you are in a health care provider's office than when you are at home, this is called white coat hypertension.  Most people with this condition do not need medicines.  When your blood pressure is higher at home than when you are in a health care provider's office, this is called masked hypertension. Most people with this condition may need medicines to control blood pressure. If you have a high blood pressure reading during one visit or you have normal blood pressure with other risk factors, you may be asked to:  Return on a different day to have your blood pressure checked again.  Monitor your blood pressure at home for 1 week or longer. If you are diagnosed with hypertension, you may have other blood or imaging tests to help your health care provider understand your overall risk for other conditions. How is this treated? This condition is treated by making healthy lifestyle changes, such as eating healthy foods, exercising more, and reducing your alcohol intake. Your health care provider may prescribe medicine if lifestyle changes are not enough to get your blood pressure under control, and if:  Your systolic blood pressure is above 130.  Your diastolic blood pressure is above 80. Your personal target blood pressure may vary depending on your medical conditions, your age, and other factors. Follow these instructions at home: Eating and drinking   Eat a diet that is high in fiber and potassium, and low in sodium, added sugar, and fat. An example eating plan is called the DASH (Dietary Approaches to Stop Hypertension) diet. To eat this way: ? Eat plenty of fresh fruits and vegetables. Try to fill one half of your plate at each meal with fruits and vegetables. ? Eat whole grains, such as whole-wheat pasta, brown rice, or whole-grain bread. Fill about one fourth of your plate with whole grains. ? Eat or drink low-fat dairy products, such as skim milk or low-fat yogurt. ? Avoid fatty cuts of meat, processed or cured meats, and poultry with skin. Fill about one fourth of your plate with lean proteins, such  as fish, chicken without skin, beans, eggs, or tofu. ? Avoid pre-made and processed foods. These tend to be higher in sodium, added sugar, and fat.  Reduce your daily sodium intake. Most people with hypertension should eat less than 1,500 mg of sodium a day.  Do not drink alcohol if: ? Your health care provider tells you not to drink. ? You are pregnant, may be pregnant, or are planning to become pregnant.  If you drink alcohol: ? Limit how much you use to:  0-1 drink a day for women.  0-2 drinks a day for men. ? Be aware of how much alcohol is in your drink. In the [place] one drink equals one 12 oz bottle of beer (355 mL), one 5 oz glass of wine (148 mL), or one 1 oz glass of hard liquor (44 mL). Lifestyle   Work with your health care provider to maintain a healthy body weight or to lose weight. Ask what an ideal weight is for you.  Get at least 30 minutes of exercise most days of the week. Activities may include walking, swimming, or  biking.  Include exercise to strengthen your muscles (resistance exercise), such as Pilates or lifting weights, as part of your weekly exercise routine. Try to do these types of exercises for 30 minutes at least 3 days a week.  Do not use any products that contain nicotine or tobacco, such as cigarettes, e-cigarettes, and chewing tobacco. If you need help quitting, ask your health care provider.  Monitor your blood pressure at home as told by your health care provider.  Keep all follow-up visits as told by your health care provider. This is important. Medicines  Take over-the-counter and prescription medicines only as told by your health care provider. Follow directions carefully. Blood pressure medicines must be taken as prescribed.  Do not skip doses of blood pressure medicine. Doing this puts you at risk for problems and can make the medicine less effective.  Ask your health care provider about side effects or reactions to medicines that you  should watch for. Contact a health care provider if you:  Think you are having a reaction to a medicine you are taking.  Have headaches that keep coming back (recurring).  Feel dizzy.  Have swelling in your ankles.  Have trouble with your vision. Get help right away if you:  Develop a severe headache or confusion.  Have unusual weakness or numbness.  Feel faint.  Have severe pain in your chest or abdomen.  Vomit repeatedly.  Have trouble breathing. Summary  Hypertension is when the force of blood pumping through your arteries is too strong. If this condition is not controlled, it may put you at risk for serious complications.  Your personal target blood pressure may vary depending on your medical conditions, your age, and other factors. For most people, a normal blood pressure is less than 120/80.  Hypertension is treated with lifestyle changes, medicines, or a combination of both. Lifestyle changes include losing weight, eating a healthy, low-sodium diet, exercising more, and limiting alcohol. This information is not intended to replace advice given to you by your health care provider. Make sure you discuss any questions you have with your health care provider. Document Released: 09/01/2005 Document Revised: 05/12/2018 Document Reviewed: 05/12/2018 Elsevier Patient Education  2020 [employer].

## 2019-03-24 LAB — CBC WITH DIFFERENTIAL/PLATELET
Basophils Absolute: 0.1 10*3/uL (ref 0.0–0.2)
Basos: 1 %
EOS (ABSOLUTE): 0.1 10*3/uL (ref 0.0–0.4)
Eos: 1 %
Hematocrit: 34.7 % (ref 34.0–46.6)
Hemoglobin: 11.6 g/dL (ref 11.1–15.9)
Immature Grans (Abs): 0 10*3/uL (ref 0.0–0.1)
Immature Granulocytes: 0 %
Lymphocytes Absolute: 3 10*3/uL (ref 0.7–3.1)
Lymphs: 43 %
MCH: 30.2 pg (ref 26.6–33.0)
MCHC: 33.4 g/dL (ref 31.5–35.7)
MCV: 90 fL (ref 79–97)
Monocytes Absolute: 0.5 10*3/uL (ref 0.1–0.9)
Monocytes: 7 %
Neutrophils Absolute: 3.3 10*3/uL (ref 1.4–7.0)
Neutrophils: 48 %
Platelets: 270 10*3/uL (ref 150–450)
RBC: 3.84 x10E6/uL (ref 3.77–5.28)
RDW: 12.2 % (ref 11.7–15.4)
WBC: 7 10*3/uL (ref 3.4–10.8)

## 2019-03-24 LAB — COMPREHENSIVE METABOLIC PANEL
ALT: 14 IU/L (ref 0–32)
AST: 20 IU/L (ref 0–40)
Albumin/Globulin Ratio: 1.5 (ref 1.2–2.2)
Albumin: 4.5 g/dL (ref 3.8–4.8)
Alkaline Phosphatase: 48 IU/L (ref 39–117)
BUN/Creatinine Ratio: 13 (ref 9–23)
BUN: 12 mg/dL (ref 6–24)
Bilirubin Total: 0.3 mg/dL (ref 0.0–1.2)
CO2: 20 mmol/L (ref 20–29)
Calcium: 9.6 mg/dL (ref 8.7–10.2)
Chloride: 103 mmol/L (ref 96–106)
Creatinine, Ser: 0.95 mg/dL (ref 0.57–1.00)
GFR calc Af Amer: 84 mL/min/{1.73_m2} (ref >59)
GFR calc non Af Amer: 73 mL/min/{1.73_m2} (ref >59)
Globulin, Total: 3 g/dL (ref 1.5–4.5)
Glucose: 84 mg/dL (ref 65–99)
Potassium: 4.1 mmol/L (ref 3.5–5.2)
Sodium: 137 mmol/L (ref 134–144)
Total Protein: 7.5 g/dL (ref 6.0–8.5)

## 2019-03-24 LAB — LIPID PANEL
Chol/HDL Ratio: 2.7 ratio (ref 0.0–4.4)
Cholesterol, Total: 159 mg/dL (ref 100–199)
HDL: 58 mg/dL (ref >39)
LDL Calculated: 82 mg/dL (ref 0–99)
Triglycerides: 96 mg/dL (ref 0–149)
VLDL Cholesterol Cal: 19 mg/dL (ref 5–40)

## 2019-03-24 LAB — HEMOGLOBIN A1C
Est. average glucose Bld gHb Est-mCnc: 103 mg/dL
Hgb A1c MFr Bld: 5.2 % (ref 4.8–5.6)

## 2019-03-26 ENCOUNTER — Encounter: Payer: Self-pay | Admitting: Emergency Medicine

## 2019-05-08 IMAGING — US US TRANSVAGINAL NON-OB
1 series · 15 of 25 positions shown · non-contrast
Comparison: None

CLINICAL DATA: Dysmenorrhea. Menorrhagia with regular cycle.
Enlarged uterus.

EXAM:
TRANSABDOMINAL AND TRANSVAGINAL ULTRASOUND OF PELVIS
TECHNIQUE: Both transabdominal and transvaginal ultrasound examinations of the
pelvis were performed. Transabdominal technique was performed for
global imaging of the pelvis including uterus, ovaries, adnexal
regions, and pelvic cul-de-sac. It was necessary to proceed with
endovaginal exam following the transabdominal exam to visualize the
endometrial stripe and ovaries..

[Series 1: us transvaginal non-ob · 15 of 71 slices shown]
[im 1/71]
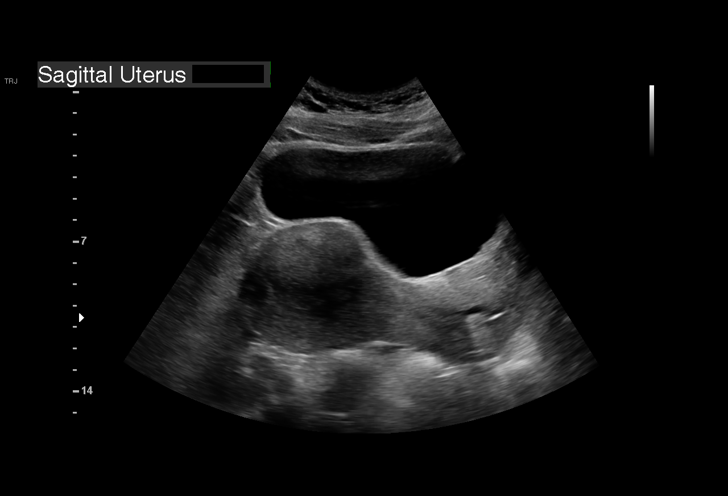
[im 6/71]
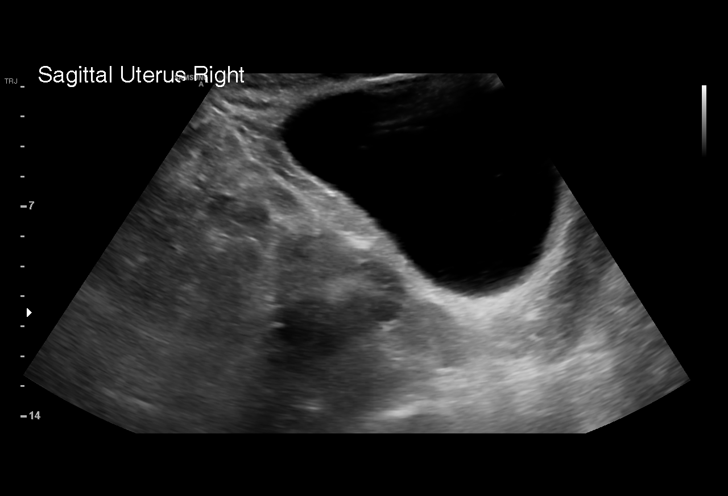
[im 12/71]
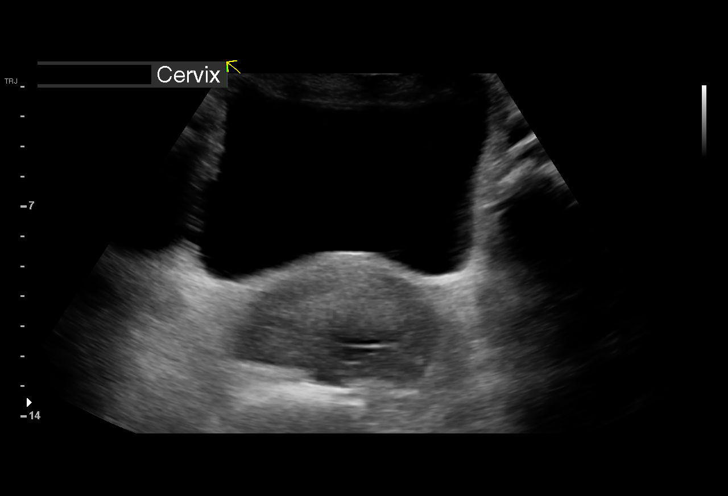
[im 15/71]
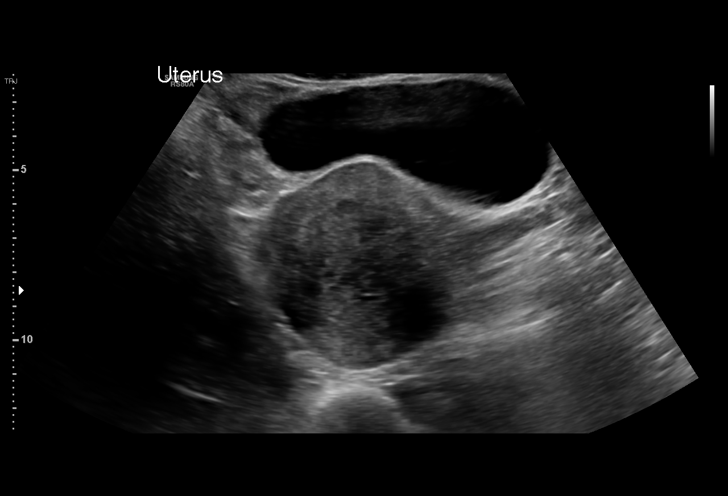
[im 21/71]
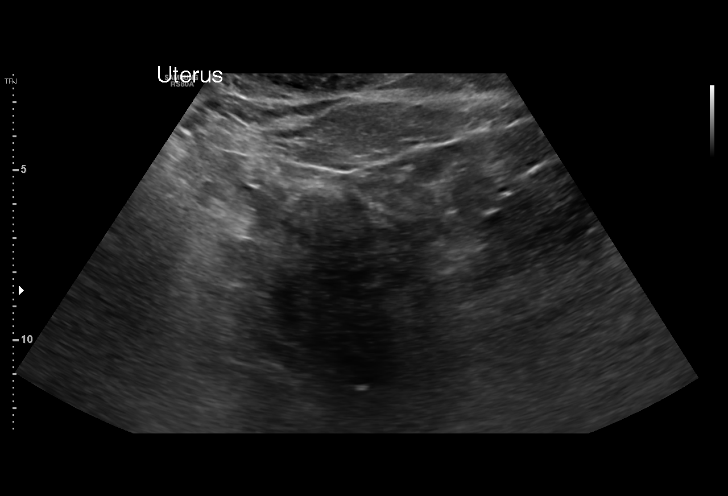
[im 27/71]
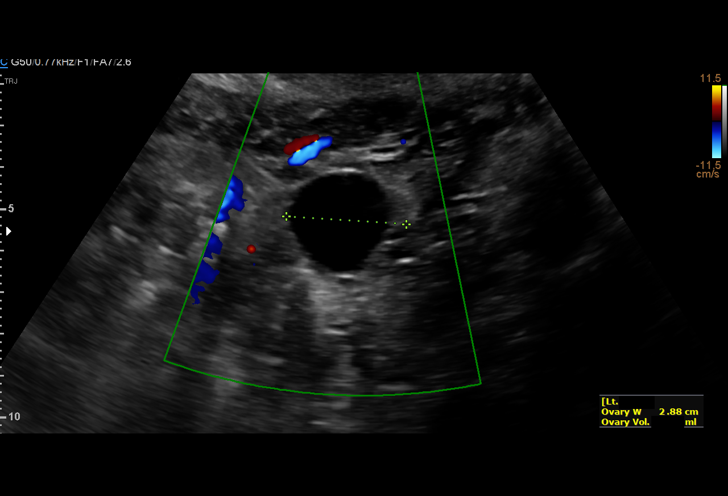
[im 30/71]
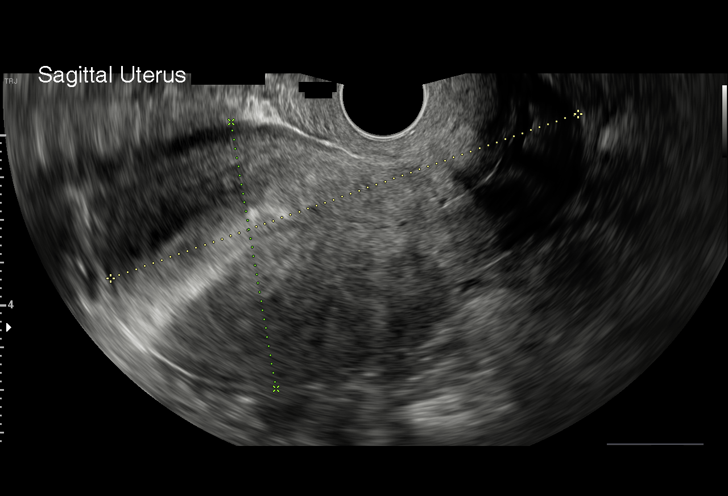
[im 36/71]
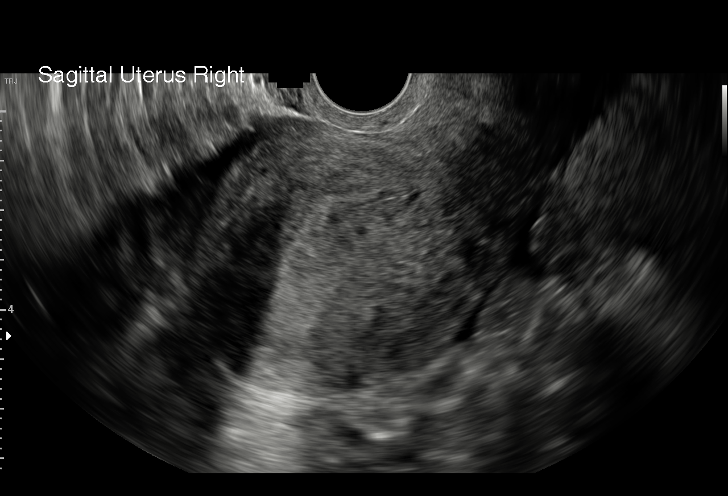
[im 41/71]
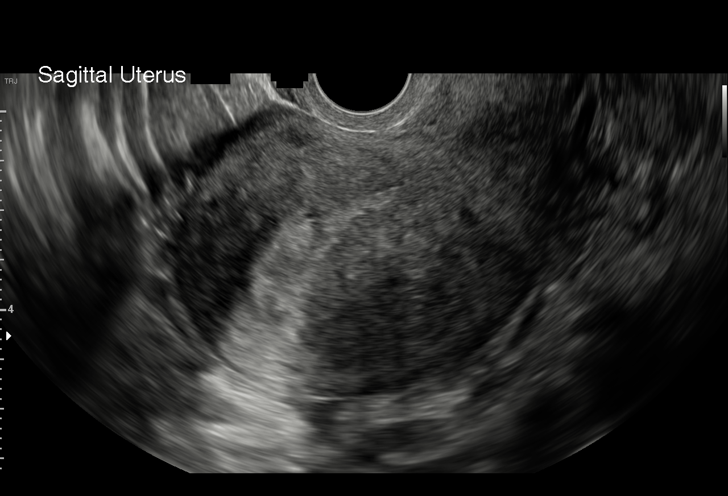
[im 44/71]
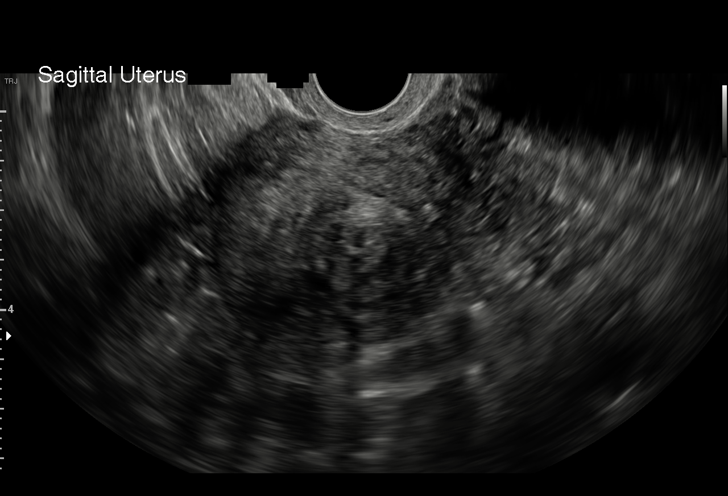
[im 50/71]
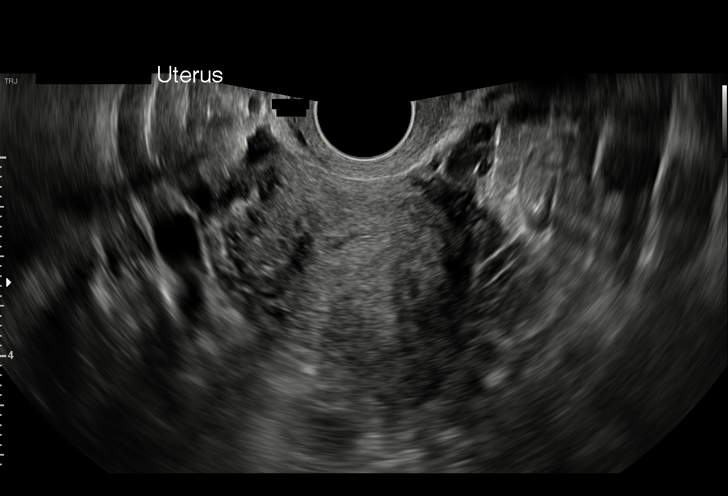
[im 56/71]
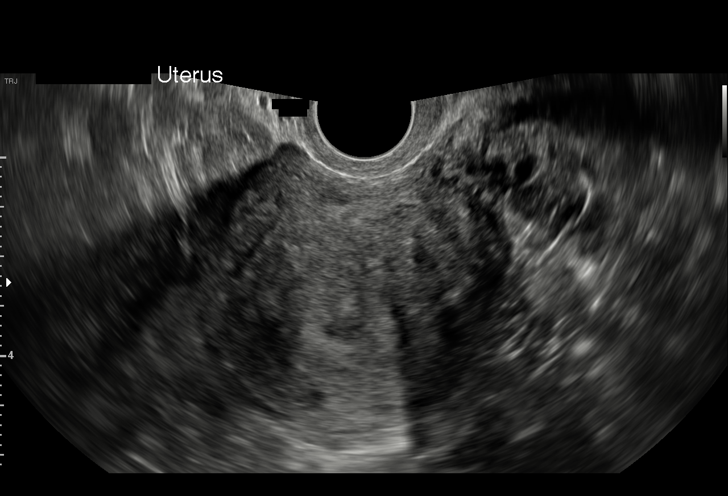
[im 59/71]
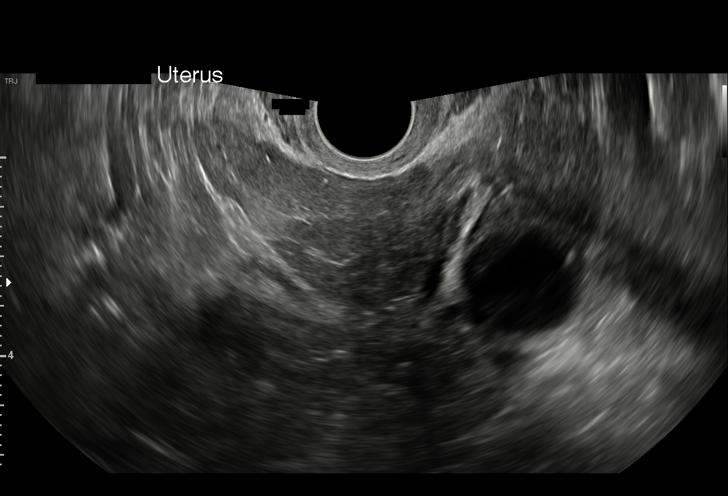
[im 65/71]
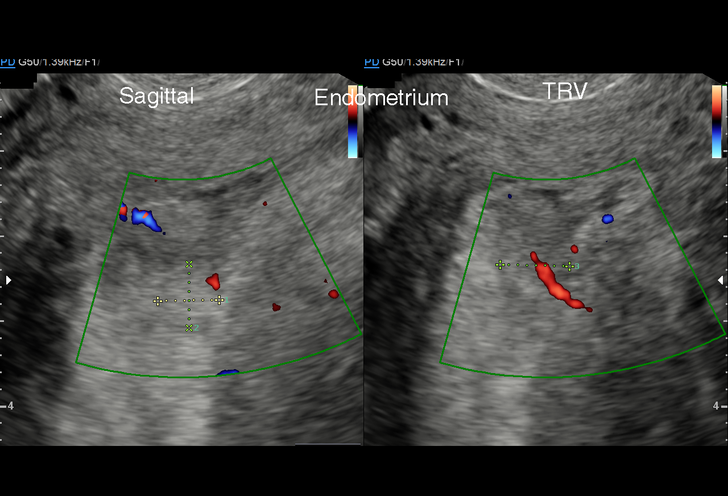
[im 71/71]
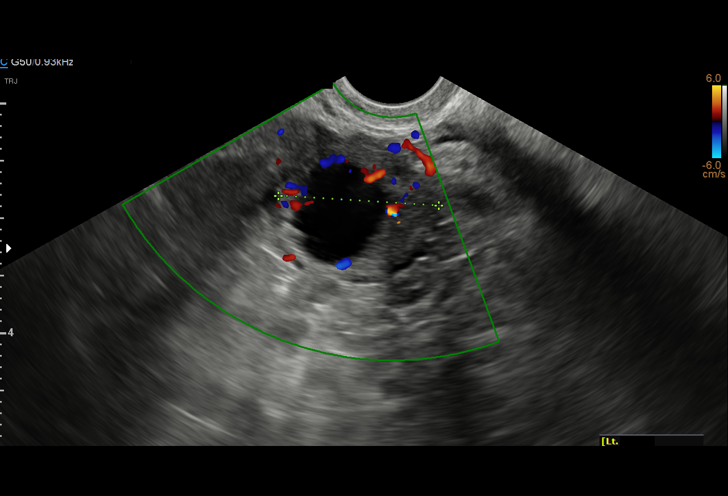

[15 of 25 positions shown; findings below may reference images not displayed]

FINDINGS: Uterus

Measurements: 11.6 x 6.4 x 7.4 cm. An intramural fibroid is seen in
the left posterior corpus measuring 2.0 cm in maximum diameter.

Endometrium

Thickness: 16 mm. Heterogeneous appearance, but no definite focal
endometrial mass identified.

Right ovary

Measurements: 2.8 x 1.4 x 1.7 cm. Normal appearance/no adnexal mass.

Left ovary

Measurements: 3.2 x 2.3 x 2.8 cm. 2.5 cm simple follicular cyst
noted. Otherwise normal appearance/no adnexal mass.

Other findings

No abnormal free fluid.
IMPRESSION: 2 cm fibroid in left posterior uterine corpus.

Endometrial thickness measures 16 mm. If bleeding remains
unresponsive to hormonal or medical therapy, focal lesion work-up
with sonohysterogram should be considered. Endometrial biopsy should
also be considered in pre-menopausal patients at high risk for
endometrial carcinoma. (Ref: Radiological Reasoning: Algorithmic
Workup of Abnormal Vaginal Bleeding with Endovaginal Sonography and
Sonohysterography. AJR [id number]; 191:S68-73).

Normal appearance of both ovaries.

## 2019-09-01 ENCOUNTER — Inpatient Hospital Stay: Payer: PRIVATE HEALTH INSURANCE | Primary: Adult Medicine

## 2019-09-01 DIAGNOSIS — Z20828 Contact with and (suspected) exposure to other viral communicable diseases: Secondary | ICD-10-CM

## 2019-09-03 LAB — COVID-19 AMBULATORY: SARS-CoV-2: NOT DETECTED

## 2019-09-19 ENCOUNTER — Other Ambulatory Visit: Payer: Self-pay

## 2019-09-19 ENCOUNTER — Ambulatory Visit (INDEPENDENT_AMBULATORY_CARE_PROVIDER_SITE_OTHER): Payer: 59 | Admitting: Emergency Medicine

## 2019-09-19 ENCOUNTER — Encounter: Payer: Self-pay | Admitting: Emergency Medicine

## 2019-09-19 VITALS — BP 124/79 | HR 66 | Temp 98.2°F | Resp 16 | Ht 63.0 in | Wt 191.4 lb

## 2019-09-19 DIAGNOSIS — I10 Essential (primary) hypertension: Secondary | ICD-10-CM | POA: Diagnosis not present

## 2019-09-19 MED ORDER — FLUCONAZOLE 150 MG PO TABS: 150.0000 mg | ORAL_TABLET | Freq: Once | ORAL | 3 refills | Status: AC

## 2019-09-19 NOTE — Progress Notes (Signed)
BP Readings from Last 3 Encounters:  03/23/19 114/75  03/03/18 125/82  01/02/18 114/90   Pamela Allen 44 y.o.   Chief Complaint  Patient presents with  . Hypertension    follow up x 6 month    HISTORY OF PRESENT ILLNESS: This is a 44 y.o. female times with history of hypertension here for follow-up.  Doing well.  No complaints or medical concerns today.  HPI   Prior to Admission medications   Medication Sig Start Date End Date Taking? Authorizing Provider  amLODipine-valsartan (EXFORGE) 10-320 MG tablet Take 1 tablet by mouth daily. 03/23/19  Yes [place] Nase, Ines Bloomer, MD  cetirizine (ZYRTEC) 10 MG tablet Take 1 tablet (10 mg total) by mouth at bedtime. 03/23/19  Yes Menashe Kafer, Ines Bloomer, MD  montelukast (SINGULAIR) 10 MG tablet Take 1 tablet (10 mg total) by mouth at bedtime. 03/23/19  Yes Eileen Kangas, Ines Bloomer, MD  Olopatadine HCl (PATADAY) 0.2 % SOLN Apply 1 drop to eye daily. 03/23/19  Yes Horald Pollen, MD  tinidazole [facility] 500 MG tablet Take 4 tablets (2,000 mg total) by mouth daily with breakfast. 03/23/19  Yes Bellarose Burtt, Ines Bloomer, MD    Allergies  Allergen Reactions  . 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 Iron deficiency 03/02/2018  . Class 1 obesity due to excess calories with serious comorbidity and body mass index (BMI) of 34.0 to 34.9 in adult 03/02/2018  . Seasonal allergies 01/02/2018  . 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 Vitamin D deficiency 11/11/2014  . HTN (hypertension) 11/21/2011    Past Medical History:  Diagnosis Date  . Allergy   . Hypertension   . Mass    left buttock    Past Surgical History:  Procedure Laterality Date  . BREAST EXCISIONAL BIOPSY     right US guided  . MASS EXCISION Left 01/17/2016   Procedure: EXCISION BIOPSY OF MASS LEFT BUTTOCK ;  Surgeon: Leighton Ruff, MD;  Location: [place];  Service: General;  Laterality: Left;  . TUBAL  LIGATION Bilateral 10-30-2003   w/ Bilateral Salpingectomy    Social History   Socioeconomic History  . Marital status: Married    Spouse name: Not on file  . Number of children: Not on file  . Years of education: Not on file  . Highest education level: Not on file  Occupational History  . Not on file  Tobacco Use  . Smoking status: Never Smoker  . Smokeless tobacco: Never Used  Substance and Sexual Activity  . Alcohol use: Yes    Comment: occasional  . Drug use: No  . Sexual activity: Not on file  Other Topics Concern  . Not on file  Social History Narrative   Married   Cytogeneticist / CNA   Social Determinants of Health   Financial Resource Strain:   . Difficulty of Paying Living Expenses: Not on file  Food Insecurity:   . Worried About Charity fundraiser in the Last Year: Not on file  . Ran Out of Food in the Last Year: Not on file  Transportation Needs:   . Lack of Transportation (Medical): Not on file  . Lack of Transportation (Non-Medical): Not on file  Physical Activity:   . Days of Exercise per Week: Not on file  . Minutes of Exercise per Session: Not on file  Stress:   .  Feeling of Stress : Not on file  Social Connections:   . Frequency of Communication with Friends and Family: Not on file  . Frequency of Social Gatherings with Friends and Family: Not on file  . Attends Religious Services: Not on file  . Active Member of Clubs or Organizations: Not on file  . Attends Archivist Meetings: Not on file  . Marital Status: Not on file  Intimate Partner Violence:   . Fear of Current or Ex-Partner: Not on file  . Emotionally Abused: Not on file  . Physically Abused: Not on file  . Sexually Abused: Not on file    Family History  Problem Relation Age of Onset  . Diabetes Mother   . Heart disease Mother   . Hypertension Mother   . Stroke Mother   . Cancer Father   . Hypertension Father   . Stroke Sister      Review of  Systems  Constitutional: Negative.  Negative for chills and fever.  HENT: Negative.  Negative for congestion and sore throat.   Respiratory: Negative.  Negative for cough and shortness of breath.   Cardiovascular: Negative.  Negative for chest pain and palpitations.  Gastrointestinal: Negative.  Negative for abdominal pain, diarrhea, nausea and vomiting.  Genitourinary: Negative.  Negative for dysuria.  Musculoskeletal: Negative.  Negative for myalgias.  Skin: Negative.   Neurological: Negative.  Negative for dizziness and headaches.  Endo/Heme/Allergies: Negative.   All other systems reviewed and are negative.  Today's Vitals   09/19/19 1424  BP: 124/79  Pulse: 66  Resp: 16  Temp: 98.2 F (36.8 C)  TempSrc: Temporal  SpO2: 99%  Weight: 191 lb 6.4 oz (86.8 kg)  Height: 5' 3"  (1.6 m)   Body mass index is 33.9 kg/m.   Physical Exam Vitals reviewed.  Constitutional:      Appearance: Normal appearance.  HENT:     Head: Normocephalic.  Eyes:     Extraocular Movements: Extraocular movements intact.     Pupils: Pupils are equal, round, and reactive to light.  Cardiovascular:     Rate and Rhythm: Normal rate and regular rhythm.     Pulses: Normal pulses.     Heart sounds: Normal heart sounds.  Pulmonary:     Effort: Pulmonary effort is normal.     Breath sounds: Normal breath sounds.  Musculoskeletal:        General: Normal range of motion.     Cervical back: Normal range of motion and neck supple.  Skin:    General: Skin is warm and dry.     Capillary Refill: Capillary refill takes less than 2 seconds.  Neurological:     General: No focal deficit present.     Mental Status: She is alert and oriented to person, place, and time.  Psychiatric:        Mood and Affect: Mood normal.        Behavior: Behavior normal.     A total of 25 minutes was spent in the room with the patient, greater than 50% of which was in counseling/coordination of care regarding hypertension and  cardiovascular risks associated with it, medication review, review of most recent blood work, diet and nutrition, prognosis, and need for follow-up.   ASSESSMENT & PLAN: Clinically stable.  No medical concerns identified during this visit.  Continue present medications.  No changes. Fumi was seen today for hypertension.  Diagnoses and all orders for this visit:  Essential hypertension  Other orders -  fluconazole (DIFLUCAN) 150 MG tablet; Take 1 tablet (150 mg total) by mouth once for 1 dose.    Patient Instructions       If you have lab work done today you will be contacted with your lab results within the next 2 weeks.  If you have not heard from [place] then please contact us. The fastest way to get your results is to register for My Chart.   IF you received an x-ray today, you will receive an invoice from [facility]. Please contact [facility] at [phone] with questions or concerns regarding your invoice.   IF you received labwork today, you will receive an invoice from [facility]. Please contact [facility] at [phone] with questions or concerns regarding your invoice.   Our billing staff will not be able to assist you with questions regarding bills from these companies.  You will be contacted with the lab results as soon as they are available. The fastest way to get your results is to activate your My Chart account. Instructions are located on the last page of this paperwork. If you have not heard from [place] regarding the results in 2 weeks, please contact this office.     Hypertension, Adult High blood pressure (hypertension) is when the force of blood pumping through the arteries is too strong. The arteries are the blood vessels that carry blood from the heart throughout the body. Hypertension forces the heart to work harder to pump blood and may cause arteries to become narrow or stiff. Untreated or uncontrolled hypertension can cause a heart attack,  heart failure, a stroke, kidney disease, and other problems. A blood pressure reading consists of a higher number over a lower number. Ideally, your blood pressure should be below 120/80. The first ("top") number is called the systolic pressure. It is a measure of the pressure in your arteries as your heart beats. The second ("bottom") number is called the diastolic pressure. It is a measure of the pressure in your arteries as the heart relaxes. What are the causes? The exact cause of this condition is not known. There are some conditions that result in or are related to high blood pressure. What increases the risk? Some risk factors for high blood pressure are under your control. The following factors may make you more likely to develop this condition:  Smoking.  Having type 2 diabetes mellitus, high cholesterol, or both.  Not getting enough exercise or physical activity.  Being overweight.  Having too much fat, sugar, calories, or salt (sodium) in your diet.  Drinking too much alcohol. Some risk factors for high blood pressure may be difficult or impossible to change. Some of these factors include:  Having chronic kidney disease.  Having a family history of high blood pressure.  Age. Risk increases with age.  Race. You may be at higher risk if you are African American.  Gender. Men are at higher risk than women before age 76. After age 27, women are at higher risk than men.  Having obstructive sleep apnea.  Stress. What are the signs or symptoms? High blood pressure may not cause symptoms. Very high blood pressure (hypertensive crisis) may cause:  Headache.  Anxiety.  Shortness of breath.  Nosebleed.  Nausea and vomiting.  Vision changes.  Severe chest pain.  Seizures. How is this diagnosed? This condition is diagnosed by measuring your blood pressure while you are seated, with your arm resting on a flat surface, your legs uncrossed, and your feet flat on the  floor. The cuff of the blood pressure monitor will be placed directly against the skin of your upper arm at the level of your heart. It should be measured at least twice using the same arm. Certain conditions can cause a difference in blood pressure between your right and left arms. Certain factors can cause blood pressure readings to be lower or higher than normal for a short period of time:  When your blood pressure is higher when you are in a health care provider's office than when you are at home, this is called white coat hypertension. Most people with this condition do not need medicines.  When your blood pressure is higher at home than when you are in a health care provider's office, this is called masked hypertension. Most people with this condition may need medicines to control blood pressure. If you have a high blood pressure reading during one visit or you have normal blood pressure with other risk factors, you may be asked to:  Return on a different day to have your blood pressure checked again.  Monitor your blood pressure at home for 1 week or longer. If you are diagnosed with hypertension, you may have other blood or imaging tests to help your health care provider understand your overall risk for other conditions. How is this treated? This condition is treated by making healthy lifestyle changes, such as eating healthy foods, exercising more, and reducing your alcohol intake. Your health care provider may prescribe medicine if lifestyle changes are not enough to get your blood pressure under control, and if:  Your systolic blood pressure is above 130.  Your diastolic blood pressure is above 80. Your personal target blood pressure may vary depending on your medical conditions, your age, and other factors. Follow these instructions at home: Eating and drinking   Eat a diet that is high in fiber and potassium, and low in sodium, added sugar, and fat. An example eating plan is  called the DASH (Dietary Approaches to Stop Hypertension) diet. To eat this way: ? Eat plenty of fresh fruits and vegetables. Try to fill one half of your plate at each meal with fruits and vegetables. ? Eat whole grains, such as whole-wheat pasta, brown rice, or whole-grain bread. Fill about one fourth of your plate with whole grains. ? Eat or drink low-fat dairy products, such as skim milk or low-fat yogurt. ? Avoid fatty cuts of meat, processed or cured meats, and poultry with skin. Fill about one fourth of your plate with lean proteins, such as fish, chicken without skin, beans, eggs, or tofu. ? Avoid pre-made and processed foods. These tend to be higher in sodium, added sugar, and fat.  Reduce your daily sodium intake. Most people with hypertension should eat less than 1,500 mg of sodium a day.  Do not drink alcohol if: ? Your health care provider tells you not to drink. ? You are pregnant, may be pregnant, or are planning to become pregnant.  If you drink alcohol: ? Limit how much you use to:  0-1 drink a day for women.  0-2 drinks a day for men. ? Be aware of how much alcohol is in your drink. In the [place] one drink equals one 12 oz bottle of beer (355 mL), one 5 oz glass of wine (148 mL), or one 1 oz glass of hard liquor (44 mL). Lifestyle   Work with your health care provider to maintain a healthy body weight or to lose weight. Ask what an  ideal weight is for you.  Get at least 30 minutes of exercise most days of the week. Activities may include walking, swimming, or biking.  Include exercise to strengthen your muscles (resistance exercise), such as Pilates or lifting weights, as part of your weekly exercise routine. Try to do these types of exercises for 30 minutes at least 3 days a week.  Do not use any products that contain nicotine or tobacco, such as cigarettes, e-cigarettes, and chewing tobacco. If you need help quitting, ask your health care provider.  Monitor your  blood pressure at home as told by your health care provider.  Keep all follow-up visits as told by your health care provider. This is important. Medicines  Take over-the-counter and prescription medicines only as told by your health care provider. Follow directions carefully. Blood pressure medicines must be taken as prescribed.  Do not skip doses of blood pressure medicine. Doing this puts you at risk for problems and can make the medicine less effective.  Ask your health care provider about side effects or reactions to medicines that you should watch for. Contact a health care provider if you:  Think you are having a reaction to a medicine you are taking.  Have headaches that keep coming back (recurring).  Feel dizzy.  Have swelling in your ankles.  Have trouble with your vision. Get help right away if you:  Develop a severe headache or confusion.  Have unusual weakness or numbness.  Feel faint.  Have severe pain in your chest or abdomen.  Vomit repeatedly.  Have trouble breathing. Summary  Hypertension is when the force of blood pumping through your arteries is too strong. If this condition is not controlled, it may put you at risk for serious complications.  Your personal target blood pressure may vary depending on your medical conditions, your age, and other factors. For most people, a normal blood pressure is less than 120/80.  Hypertension is treated with lifestyle changes, medicines, or a combination of both. Lifestyle changes include losing weight, eating a healthy, low-sodium diet, exercising more, and limiting alcohol. This information is not intended to replace advice given to you by your health care provider. Make sure you discuss any questions you have with your health care provider. Document Revised: 05/12/2018 Document Reviewed: 05/12/2018 Elsevier Patient Education  2020 [employer]     Agustina Caroli, MD [facility]

## 2019-09-19 NOTE — Patient Instructions (Addendum)
If you have lab work done today you will be contacted with your lab results within the next 2 weeks.  If you have not heard from [place] then please contact us. The fastest way to get your results is to register for My Chart.   IF you received an x-ray today, you will receive an invoice from [facility]. Please contact [facility] at [phone] with questions or concerns regarding your invoice.   IF you received labwork today, you will receive an invoice from [facility]. Please contact [facility] at [phone] with questions or concerns regarding your invoice.   Our billing staff will not be able to assist you with questions regarding bills from these companies.  You will be contacted with the lab results as soon as they are available. The fastest way to get your results is to activate your My Chart account. Instructions are located on the last page of this paperwork. If you have not heard from [place] regarding the results in 2 weeks, please contact this office.     Hypertension, Adult High blood pressure (hypertension) is when the force of blood pumping through the arteries is too strong. The arteries are the blood vessels that carry blood from the heart throughout the body. Hypertension forces the heart to work harder to pump blood and may cause arteries to become narrow or stiff. Untreated or uncontrolled hypertension can cause a heart attack, heart failure, a stroke, kidney disease, and other problems. A blood pressure reading consists of a higher number over a lower number. Ideally, your blood pressure should be below 120/80. The first ("top") number is called the systolic pressure. It is a measure of the pressure in your arteries as your heart beats. The second ("bottom") number is called the diastolic pressure. It is a measure of the pressure in your arteries as the heart relaxes. What are the causes? The exact cause of this condition is not known. There are some conditions  that result in or are related to high blood pressure. What increases the risk? Some risk factors for high blood pressure are under your control. The following factors may make you more likely to develop this condition:  Smoking.  Having type 2 diabetes mellitus, high cholesterol, or both.  Not getting enough exercise or physical activity.  Being overweight.  Having too much fat, sugar, calories, or salt (sodium) in your diet.  Drinking too much alcohol. Some risk factors for high blood pressure may be difficult or impossible to change. Some of these factors include:  Having chronic kidney disease.  Having a family history of high blood pressure.  Age. Risk increases with age.  Race. You may be at higher risk if you are African American.  Gender. Men are at higher risk than women before age 51. After age 62, women are at higher risk than men.  Having obstructive sleep apnea.  Stress. What are the signs or symptoms? High blood pressure may not cause symptoms. Very high blood pressure (hypertensive crisis) may cause:  Headache.  Anxiety.  Shortness of breath.  Nosebleed.  Nausea and vomiting.  Vision changes.  Severe chest pain.  Seizures. How is this diagnosed? This condition is diagnosed by measuring your blood pressure while you are seated, with your arm resting on a flat surface, your legs uncrossed, and your feet flat on the floor. The cuff of the blood pressure monitor will be placed directly against the skin of your upper arm at the level of your heart.  It should be measured at least twice using the same arm. Certain conditions can cause a difference in blood pressure between your right and left arms. Certain factors can cause blood pressure readings to be lower or higher than normal for a short period of time:  When your blood pressure is higher when you are in a health care provider's office than when you are at home, this is called white coat hypertension.  Most people with this condition do not need medicines.  When your blood pressure is higher at home than when you are in a health care provider's office, this is called masked hypertension. Most people with this condition may need medicines to control blood pressure. If you have a high blood pressure reading during one visit or you have normal blood pressure with other risk factors, you may be asked to:  Return on a different day to have your blood pressure checked again.  Monitor your blood pressure at home for 1 week or longer. If you are diagnosed with hypertension, you may have other blood or imaging tests to help your health care provider understand your overall risk for other conditions. How is this treated? This condition is treated by making healthy lifestyle changes, such as eating healthy foods, exercising more, and reducing your alcohol intake. Your health care provider may prescribe medicine if lifestyle changes are not enough to get your blood pressure under control, and if:  Your systolic blood pressure is above 130.  Your diastolic blood pressure is above 80. Your personal target blood pressure may vary depending on your medical conditions, your age, and other factors. Follow these instructions at home: Eating and drinking   Eat a diet that is high in fiber and potassium, and low in sodium, added sugar, and fat. An example eating plan is called the DASH (Dietary Approaches to Stop Hypertension) diet. To eat this way: ? Eat plenty of fresh fruits and vegetables. Try to fill one half of your plate at each meal with fruits and vegetables. ? Eat whole grains, such as whole-wheat pasta, brown rice, or whole-grain bread. Fill about one fourth of your plate with whole grains. ? Eat or drink low-fat dairy products, such as skim milk or low-fat yogurt. ? Avoid fatty cuts of meat, processed or cured meats, and poultry with skin. Fill about one fourth of your plate with lean proteins, such  as fish, chicken without skin, beans, eggs, or tofu. ? Avoid pre-made and processed foods. These tend to be higher in sodium, added sugar, and fat.  Reduce your daily sodium intake. Most people with hypertension should eat less than 1,500 mg of sodium a day.  Do not drink alcohol if: ? Your health care provider tells you not to drink. ? You are pregnant, may be pregnant, or are planning to become pregnant.  If you drink alcohol: ? Limit how much you use to:  0-1 drink a day for women.  0-2 drinks a day for men. ? Be aware of how much alcohol is in your drink. In the [place] one drink equals one 12 oz bottle of beer (355 mL), one 5 oz glass of wine (148 mL), or one 1 oz glass of hard liquor (44 mL). Lifestyle   Work with your health care provider to maintain a healthy body weight or to lose weight. Ask what an ideal weight is for you.  Get at least 30 minutes of exercise most days of the week. Activities may include walking, swimming, or  biking.  Include exercise to strengthen your muscles (resistance exercise), such as Pilates or lifting weights, as part of your weekly exercise routine. Try to do these types of exercises for 30 minutes at least 3 days a week.  Do not use any products that contain nicotine or tobacco, such as cigarettes, e-cigarettes, and chewing tobacco. If you need help quitting, ask your health care provider.  Monitor your blood pressure at home as told by your health care provider.  Keep all follow-up visits as told by your health care provider. This is important. Medicines  Take over-the-counter and prescription medicines only as told by your health care provider. Follow directions carefully. Blood pressure medicines must be taken as prescribed.  Do not skip doses of blood pressure medicine. Doing this puts you at risk for problems and can make the medicine less effective.  Ask your health care provider about side effects or reactions to medicines that you  should watch for. Contact a health care provider if you:  Think you are having a reaction to a medicine you are taking.  Have headaches that keep coming back (recurring).  Feel dizzy.  Have swelling in your ankles.  Have trouble with your vision. Get help right away if you:  Develop a severe headache or confusion.  Have unusual weakness or numbness.  Feel faint.  Have severe pain in your chest or abdomen.  Vomit repeatedly.  Have trouble breathing. Summary  Hypertension is when the force of blood pumping through your arteries is too strong. If this condition is not controlled, it may put you at risk for serious complications.  Your personal target blood pressure may vary depending on your medical conditions, your age, and other factors. For most people, a normal blood pressure is less than 120/80.  Hypertension is treated with lifestyle changes, medicines, or a combination of both. Lifestyle changes include losing weight, eating a healthy, low-sodium diet, exercising more, and limiting alcohol. This information is not intended to replace advice given to you by your health care provider. Make sure you discuss any questions you have with your health care provider. Document Revised: 05/12/2018 Document Reviewed: 05/12/2018 Elsevier Patient Education  2020 [employer].

## 2019-09-22 DIAGNOSIS — R6889 Other general symptoms and signs: Secondary | ICD-10-CM

## 2019-09-23 ENCOUNTER — Inpatient Hospital Stay: Payer: PRIVATE HEALTH INSURANCE | Primary: Adult Medicine

## 2019-09-25 LAB — COVID-19 AMBULATORY: SARS-CoV-2: NOT DETECTED

## 2020-01-02 ENCOUNTER — Telehealth: Payer: 59 | Admitting: Emergency Medicine

## 2020-03-06 ENCOUNTER — Inpatient Hospital Stay
Admit: 2020-03-06 | Discharge: 2020-03-07 | Disposition: A | Discharge status: Home / Self Care | Payer: PRIVATE HEALTH INSURANCE | Attending: Emergency Medicine

## 2020-03-06 ENCOUNTER — Emergency Department: Admit: 2020-03-07 | Payer: PRIVATE HEALTH INSURANCE | Primary: Adult Medicine

## 2020-03-06 DIAGNOSIS — R109 Unspecified abdominal pain: Secondary | ICD-10-CM

## 2020-03-06 NOTE — ED Notes (Signed)
Pt back from [place] at this time.      Jesus Genera, RN  03/06/20 [phone]

## 2020-03-06 NOTE — ED Notes (Signed)
Pt currently in [place]     Lorenia Hoston R Saunders, [place]  03/06/20 [id number]

## 2020-03-06 NOTE — ED Triage Notes (Signed)
Pt had IUD placed on 02/17/20; sts RUQ abd pain started today; nausea with no other symptoms

## 2020-03-06 NOTE — ED Provider Notes (Signed)
EMERGENCY DEPARTMENT ENCOUNTER    Patient: Regina Potter  MRN: [id number]  DOB: 12/27/1975  Date of Evaluation: 03/07/2020  ED Provider:  Tyler Deis, MD    CHIEF COMPLAINT  Chief Complaint   Patient presents with   ??? Abdominal Pain     RUQ radiating up rib cage   ??? Nausea       HPI  Regina Potter is a 44 y.o. female who presents with moderate to severe, constant sharp right upper quadrant abdominal pain with radiation of the right lower chest and right lower abdomen with onset earlier in the day. Worsens with inspiration and movement and palpation of the right upper abdomen. Does make her feel short of breath due to not being able to take full breaths due to the pain. Has had some associated nausea without emesis.    Denies any other associated symptoms or complaints or concerns.    REVIEW OF SYSTEMS    I have reviewed the nursing notes    Constitutional: negative for fever, chills, weight change, change in appetite  Neurological: negative for HA, lightheadedness, numbness, weakness  Ophthalmic: negative for vision change  ENT: negative for congestion, runny nose, sore throat  Cardiovascular: negative for chest pain, palpitations  Respiratory: negative for SOB, cough  GI: negative for vomiting, diarrhea, constipation, hematemesis, hematochezia, melena   GU: negative for dysuria, hematuria, vaginal bleeding or discharge  Musculoskeletal: negative for myalgias, decreased ROM, joint swelling  Dermatological: negative for rash, pruritis  Endocrine: negative for temperature intolerance, diaphoresis  Hematological: negative for anemia, bleeding      PAST MEDICAL HISTORY  History reviewed. No pertinent past medical history.    CURRENT MEDICATIONS  @PTAMEDLIST @    ALLERGIES  Allergies   Allergen Reactions   ??? Pcn [Penicillins] Rash       SURGICAL HISTORY  Past Surgical History:   Procedure Laterality Date   ??? TUBAL LIGATION         FAMILY HISTORY  No family history on file.    SOCIAL HISTORY  Social History      Socioeconomic History   ??? Marital status: Divorced     Spouse name: None   ??? Number of children: None   ??? Years of education: None   ??? Highest education level: None   Occupational History   ??? None   Tobacco Use   ??? Smoking status: Never Smoker   ??? Smokeless tobacco: Never Used   Vaping Use   ??? Vaping Use: Never used   Substance and Sexual Activity   ??? Alcohol use: Yes     Comment: rarely   ??? Drug use: No   ??? Sexual activity: None   Other Topics Concern   ??? None   Social History Narrative   ??? None     Social Determinants of Health     Financial Resource Strain:    ??? Difficulty of Paying Living Expenses:    Food Insecurity:    ??? Worried About Charity fundraiser in the Last Year:    ??? Arboriculturist in the Last Year:    Transportation Needs:    ??? Film/video editor (Medical):    ??? Lack of Transportation (Non-Medical):    Physical Activity:    ??? Days of Exercise per Week:    ??? Minutes of Exercise per Session:    Stress:    ??? Feeling of Stress :    Social Connections:    ???  Frequency of Communication with Friends and Family:    ??? Frequency of Social Gatherings with Friends and Family:    ??? Attends Religious Services:    ??? Marine scientist or Organizations:    ??? Attends Music therapist:    ??? Marital Status:    Human resources officer Violence:    ??? Fear of Current or Ex-Partner:    ??? Emotionally Abused:    ??? Physically Abused:    ??? Sexually Abused:            **Past medical, family and social histories reviewed and verified by me**      PHYSICAL EXAM  VITAL SIGNS:   ED Triage Vitals   Enc Vitals Group      BP       Pulse       Resp       Temp       Temp src       SpO2       Weight       Height       Head Circumference       Peak Flow       Pain Score       Pain Loc       Pain Edu?       Excl. in [place]?        Vitals during ED course were reviewed and are as charted.    Constitutional: Minimal distress, Non-toxic appearance    Eyes: Conjunctiva normal, No discharge    HENT: Normocephalic, Atraumatic,  Bilateral external ears normal, posterior oropharynx is nonerythematous and without exudate, uvula is midline, oropharynx moist    Neck: Supple, no stridor, no grossly visible or palpable masses    Cardiovascular: Regular rate and rhythm, No murmurs, No rubs, No gallops    Pulmonary/Chest:  Normal breath sounds, No respiratory distress or accessory muscle use, No wheezing, crackles or rhonchi.     Abdomen: Right upper quadrant abdominal tenderness palpation without peritoneal signs, otherwise abdomen is soft, nondistended and nonrigid, No tenderness or peritoneal signs elsewhere, No masses, normal bowel sounds    Back:  No midline point tenderness, No paraspinous muscle tenderness.  No CVA tenderness    Extremities:  No gross deformities, no edema, no tenderness    Neurologic:  Normal motor function, Normal sensory function, No focal deficits    Skin:  Warm, Dry, No erythema, No rash, No cyanosis, No mottling    Lymphatic:  No inguinal lymphadenopathy      EKG Interpretation    Interpreted by emergency department physician    Rhythm: normal sinus   Rate: normal  Axis: normal  Ectopy: none  Conduction: normal  ST Segments: normal  T Waves: normal  Q Waves: none    Clinical Impression: Normal EKG        RADIOLOGY/PROCEDURES/LABS/MEDICATIONS ADMINISTERED:    I have reviewed and interpreted all of the currently available lab results from this visit (if applicable):  Results for orders placed or performed during the hospital encounter of 03/06/20   CBC Auto Differential   Result Value Ref Range    WBC 9.7 4.0 - 10.5 K/CU MM    RBC 4.63 4.2 - 5.4 M/CU MM    Hemoglobin 12.2 (L) 12.5 - 16.0 GM/DL    Hematocrit 38.4 37 - 47 %    MCV 82.9 78 - 100 FL    MCH 26.3 (L) 27 - 31 PG    MCHC  31.8 (L) 32.0 - 36.0 %    RDW 13.2 11.7 - 14.9 %    Platelets 271 140 - 440 K/CU MM    MPV 10.1 7.5 - 11.1 FL    Differential Type AUTOMATED DIFFERENTIAL     Segs Relative 80.0 (H) 36 - 66 %    Lymphocytes % 11.9 (L) 24 - 44 %    Monocytes % 6.9  (H) 0 - 4 %    Eosinophils % 0.9 0 - 3 %    Basophils % 0.1 0 - 1 %    Segs Absolute 7.7 K/CU MM    Lymphocytes Absolute 1.2 K/CU MM    Monocytes Absolute 0.7 K/CU MM    Eosinophils Absolute 0.1 K/CU MM    Basophils Absolute 0.0 K/CU MM    Immature Neutrophil % 0.2 0 - 0.43 %    Total Immature Neutrophil 0.02 K/CU MM   Comprehensive Metabolic Panel w/ Reflex to MG   Result Value Ref Range    Sodium 140 135 - 145 MMOL/L    Potassium 4.2 3.5 - 5.1 MMOL/L    Chloride 105 99 - 110 mMol/L    CO2 24 21 - 32 MMOL/L    BUN 10 6 - 23 MG/DL    CREATININE 1.0 0.6 - 1.1 MG/DL    Glucose 101 (H) 70 - 99 MG/DL    Calcium 8.8 8.3 - 10.6 MG/DL    Albumin 3.7 3.4 - 5.0 GM/DL    Total Protein 7.0 6.4 - 8.2 GM/DL    Total Bilirubin 0.3 0.0 - 1.0 MG/DL    ALT 33 10 - 40 U/L    AST 70 (H) 15 - 37 IU/L    Alkaline Phosphatase 94 40 - 129 IU/L    GFR Non-African American 60 (L) >60 mL/min/1.62m    GFR African American >60 >60 mL/min/1.758m   Anion Gap 11 4 - 16   Lipase   Result Value Ref Range    Lipase 31 13 - 60 IU/L   Troponin   Result Value Ref Range    Troponin T <0.010 <0.01 NG/ML   D-Dimer, Rapid   Result Value Ref Range    D-Dimer, Quant 2.60 (H) <0.47 ug/mL (FEU)   Urinalysis, reflex to microscopic   Result Value Ref Range    Color, UA YELLOW YELLOW    Clarity, UA CLEAR CLEAR    Glucose, Urine NEGATIVE NEGATIVE MG/DL    Bilirubin Urine NEGATIVE NEGATIVE MG/DL    Ketones, Urine NEGATIVE NEGATIVE MG/DL    Specific Gravity, UA 1.020 1.001 - 1.035    Blood, Urine TRACE (A) NEGATIVE    pH, Urine 7.5 5.0 - 8.0    Protein, UA NEGATIVE NEGATIVE MG/DL    Urobilinogen, Urine 0.2 0.2 - 1.0 MG/DL    Nitrite Urine, Quantitative NEGATIVE NEGATIVE    Leukocyte Esterase, Urine NEGATIVE NEGATIVE    RBC, UA 3 0 - 6 /HPF    WBC, UA <1 0 - 5 /HPF    Epithelial Cells, UA 3 /HPF    Cast Type NO CAST FORMS SEEN NO CAST FORMS SEEN /HPF    Bacteria, UA NEGATIVE NEGATIVE /HPF    Crystal Type NEGATIVE NEGATIVE /HPF    Urinalysis Comments RARE     Pregnancy, Urine   Result Value Ref Range    Pregnancy, Urine NEGATIVE NEGATIVE    Specific Gravity, Urine 1.020 1.001 - 1.035    Interpretation HCG METHOD LIMITATIONS:    EKG  12 Lead   Result Value Ref Range    Ventricular Rate 78 BPM    Atrial Rate 78 BPM    P-R Interval 132 ms    QRS Duration 78 ms    Q-T Interval 390 ms    QTc Calculation (Bazett) 444 ms    P Axis 34 degrees    R Axis 57 degrees    T Axis 51 degrees    Diagnosis       Normal sinus rhythm  Normal ECG  No previous ECGs available            ABNORMAL LABS:  Labs Reviewed   CBC WITH AUTO DIFFERENTIAL - Abnormal; Notable for the following components:       Result Value    Hemoglobin 12.2 (*)     MCH 26.3 (*)     MCHC 31.8 (*)     Segs Relative 80.0 (*)     Lymphocytes % 11.9 (*)     Monocytes % 6.9 (*)     All other components within normal limits   COMPREHENSIVE METABOLIC PANEL W/ REFLEX TO MG FOR LOW K - Abnormal; Notable for the following components:    Glucose 101 (*)     AST 70 (*)     GFR Non-African American 60 (*)     All other components within normal limits   D-DIMER, RAPID - Abnormal; Notable for the following components:    D-Dimer, Quant 2.60 (*)     All other components within normal limits   URINALYSIS - Abnormal; Notable for the following components:    Blood, Urine TRACE (*)     All other components within normal limits   LIPASE   TROPONIN   PREGNANCY, URINE         IMAGING STUDIES ORDERED:  CTA PULMONARY W CONTRAST  CT ABDOMEN PELVIS W IV CONTRAST  [place] NON OB TRANSVAGINAL  US DUP ABD PEL RETRO SCROT COMPLETE  US PELVIS LIMITED    I have personally viewed the imaging studies. The radiologist interpretation is:   [place] NON OB TRANSVAGINAL   Preliminary Result   4.5 x 3.7 x 2.5 cm simple appearing left ovarian cyst.  No follow-up imaging   is recommended.      Normal color Doppler flow seen in both ovaries.      Enlarged uterus containing at least 1 fibroid measuring up to 1.5 cm.         [place] DUP ABD PEL RETRO SCROT COMPLETE   Preliminary  Result   4.5 x 3.7 x 2.5 cm simple appearing left ovarian cyst.  No follow-up imaging   is recommended.      Normal color Doppler flow seen in both ovaries.      Enlarged uterus containing at least 1 fibroid measuring up to 1.5 cm.         US PELVIS LIMITED   Preliminary Result   4.5 x 3.7 x 2.5 cm simple appearing left ovarian cyst.  No follow-up imaging   is recommended.      Normal color Doppler flow seen in both ovaries.      Enlarged uterus containing at least 1 fibroid measuring up to 1.5 cm.         CT ABDOMEN PELVIS W IV CONTRAST Additional Contrast? None   Final Result   1. Mildly complex appearing left adnexal cyst measuring 3.1 x 4.9 x 4.3 cm.   Adjacent stranding.  Recommend prompt ultrasound for complete  characterization.   2. Enlarged uterus which is nonspecific.  Recommend attention on ultrasound.   3. Cholelithiasis.   4. Postoperative changes of the stomach with small hiatal hernia present.      RECOMMENDATIONS:   4.9 cm indeterminate adnexal cyst.  Recommend prompt follow-up with pelvic US.   Reference: Joellyn Rued Radiol 2013;[id number]         CTA PULMONARY W CONTRAST   Final Result   Unremarkable chest CT pulmonary angiogram study.      No evidence of pulmonary embolism or acute thoracic aortic abnormality. Clear   lungs.               MEDICATIONS ADMINISTERED:  Medications   magnesium citrate solution 296 mL (has no administration in time range)   morphine sulfate (PF) injection 4 mg (4 mg Intravenous Given 03/06/20 2020)   ondansetron (ZOFRAN) injection 4 mg (4 mg Intravenous Given 03/06/20 2021)   iopamidol (ISOVUE-370) 76 % injection 95 mL (95 mLs Intravenous Given 03/06/20 2111)   HYDROmorphone (DILAUDID) injection 0.5 mg (0.5 mg Intravenous Given 03/06/20 2207)         COURSE & MEDICAL DECISION MAKING  Last vitals: BP 104/60    Pulse 75    Temp 98 ??F (36.7 ??C) (Oral)    Resp 16    Ht 5' 11"  (1.803 m)    Wt 232 lb (105.2 kg)    SpO2 95%    BMI 32.36 kg/m??     44 year old female with right-sided  abdominal pain of unclear cause.  She is noted to have a large amount of stool overlying the right side of the abdomen in the ascending colon.  This may be a cause of her pain.  Also incidentally noted to have a large left ovarian cyst which on CT appeared to have some stranding around it.  Ultrasound was obtained and revealed a simple ovarian cyst without evidence of torsion.  She is also not having any pain over this location.      Differential diagnoses considered included, but were not limited to, acute appendicitis, acute cholecystitis/cholangitis, acute hepatitis, Fitz-Hugh-Curtis Disease, Acute pancreatitis, acute coronary syndrome,, pulmonary embolism acute intra-abdominal catastrophe, acute vascular emergency, bowel obstruction, perforated bowel, incarcerated or strangulated hernia, colitis, diverticulitis, ischemic bowel, cystitis, pyelonephritis, kidney stone, acute ovarian torsion/pathology, salpingitis/TOA/pelvic inflammatory disease and acute ectopic pregnancy.     Of note, the patient is not sexually active with men and reports that she is a lesbian.    Patient was given the above medications with improvement in symptoms.  On repeat examination the abdomen was non-surgical without peritoneal signs. The patient was able to tolerate oral intake. Patient was advised of the changing nature of intra-abdominal pathology and specific signs and symptoms to watch which may signal serious infectious, metabolic or surgical conditions for which immediate return to the ED would be necessary for further diagnosis and treatment.       Additional workup and treatment in the ED as documented above. Patient reassured and will be discharged home. I have explained to the patient in appropriate terminology our work-up in the ED and their diagnosis. I have also given anticipatory guidance and expectant management of their condition as an outpatient as per my custom. The patient was given clear discharge and follow-up  instructions including return to the ER immediately for worsening concerns. The patient has been advised to follow-up with their primary care physician and/or referred physician in the next two to three days or sooner  if worsening and to return to the ER immediately as above with any concerns. I provided the patient counseling with regard to my customary list of strict return precautions as well as return precautions specific to the cause for today's emergency department visit. The patient will return under these provided conditions, but should also return for new concerns or further worsening. Pt and/or family understand and agree with plan      Clinical Impression:  1. Right sided abdominal pain    2. Cyst of left ovary    3. Uterine leiomyoma, unspecified location        Disposition referral (if applicable):  Dellie Catholic, DO  [address]  [place] [address]  [phone]    Schedule an appointment as soon as possible for a visit       [facility]  [address]  [place] [address]  [phone]    If symptoms worsen      Disposition medications (if applicable):  New Prescriptions    DOCUSATE SODIUM (COLACE) 100 MG CAPSULE    Take 1 capsule by mouth 2 times daily for 14 days    POLYETHYLENE GLYCOL (GLYCOLAX) 17 GM/SCOOP POWDER    Take 17 g by mouth daily for 3 days       ED Provider Disposition Time  DISPOSITION          Electronically signed by: Sharrell Ku, M.D., 03/07/2020  12:17 AM    This dictation was created with voice recognition software.  While attempts have been made to review the dictation as it is transcribed, on occasion the spoken word can be misinterpreted by the technology leading to omissions or inappropriate words, phrases or sentences.        Tyler Deis, MD  03/07/20 [phone]

## 2020-03-07 LAB — CBC WITH AUTO DIFFERENTIAL
Basophils %: 0.1 % (ref 0–1)
Basophils Absolute: 0 10*3/uL
Eosinophils %: 0.9 % (ref 0–3)
Eosinophils Absolute: 0.1 10*3/uL
Hematocrit: 38.4 % (ref 37–47)
Hemoglobin: 12.2 GM/DL — ABNORMAL LOW (ref 12.5–16.0)
Immature Neutrophil %: 0.2 % (ref 0–0.43)
Lymphocytes %: 11.9 % — ABNORMAL LOW (ref 24–44)
Lymphocytes Absolute: 1.2 10*3/uL
MCH: 26.3 PG — ABNORMAL LOW (ref 27–31)
MCHC: 31.8 % — ABNORMAL LOW (ref 32.0–36.0)
MCV: 82.9 FL (ref 78–100)
MPV: 10.1 FL (ref 7.5–11.1)
Monocytes %: 6.9 % — ABNORMAL HIGH (ref 0–4)
Monocytes Absolute: 0.7 10*3/uL
Neutrophils Absolute: 7.7 10*3/uL
Platelets: 271 10*3/uL (ref 140–440)
RBC: 4.63 10*6/uL (ref 4.2–5.4)
RDW: 13.2 % (ref 11.7–14.9)
Segs Relative: 80 % — ABNORMAL HIGH (ref 36–66)
Total Immature Neutrophil: 0.02 10*3/uL
WBC: 9.7 10*3/uL (ref 4.0–10.5)

## 2020-03-07 LAB — URINALYSIS
Bacteria, UA: NEGATIVE /HPF
Bilirubin Urine: NEGATIVE MG/DL
Cast Type: NONE SEEN /HPF
Crystal Type: NEGATIVE /HPF
Epithelial Cells, UA: 3 /HPF
Glucose, Urine: NEGATIVE MG/DL
Ketones, Urine: NEGATIVE MG/DL
Leukocyte Esterase, Urine: NEGATIVE
Nitrite Urine, Quantitative: NEGATIVE
Protein, UA: NEGATIVE MG/DL
RBC, UA: 3 /HPF (ref 0–6)
Specific Gravity, UA: 1.02 (ref 1.001–1.035)
Urobilinogen, Urine: 0.2 MG/DL (ref 0.2–1.0)
WBC, UA: <1 /HPF (ref 0–5)
pH, Urine: 7.5 (ref 5.0–8.0)

## 2020-03-07 LAB — COMPREHENSIVE METABOLIC PANEL W/ REFLEX TO MG FOR LOW K
ALT: 33 U/L (ref 10–40)
AST: 70 IU/L — ABNORMAL HIGH (ref 15–37)
Albumin: 3.7 GM/DL (ref 3.4–5.0)
Alkaline Phosphatase: 94 IU/L (ref 40–129)
Anion Gap: 11 (ref 4–16)
BUN: 10 MG/DL (ref 6–23)
CO2: 24 MMOL/L (ref 21–32)
Calcium: 8.8 MG/DL (ref 8.3–10.6)
Chloride: 105 mMol/L (ref 99–110)
Creatinine: 1 MG/DL (ref 0.6–1.1)
GFR African American: >60 mL/min/{1.73_m2} (ref >60)
GFR Non-African American: 60 mL/min/{1.73_m2} — ABNORMAL LOW (ref >60)
Glucose: 101 MG/DL — ABNORMAL HIGH (ref 70–99)
Potassium: 4.2 MMOL/L (ref 3.5–5.1)
Sodium: 140 MMOL/L (ref 135–145)
Total Bilirubin: 0.3 MG/DL (ref 0.0–1.0)
Total Protein: 7 GM/DL (ref 6.4–8.2)

## 2020-03-07 LAB — EKG 12-LEAD
Atrial Rate: 78 {beats}/min
Diagnosis: NORMAL
P Axis: 34 degrees
P-R Interval: 132 ms
Q-T Interval: 390 ms
QRS Duration: 78 ms
QTc Calculation (Bazett): 444 ms
R Axis: 57 degrees
T Axis: 51 degrees
Ventricular Rate: 78 {beats}/min

## 2020-03-07 LAB — PREGNANCY, URINE
Pregnancy, Urine: NEGATIVE
Specific Gravity, Urine: 1.02 (ref 1.001–1.035)

## 2020-03-07 LAB — D-DIMER, RAPID: D-Dimer, Quant: 2.6 ug{FEU}/mL — ABNORMAL HIGH (ref <0.47)

## 2020-03-07 LAB — LIPASE: Lipase: 31 IU/L (ref 13–60)

## 2020-03-07 LAB — TROPONIN: Troponin T: <0.01 NG/ML (ref <0.01)

## 2020-03-07 MED ORDER — HYDROMORPHONE HCL 2 MG/ML IJ SOLN
2 MG/ML | Freq: Once | INTRAMUSCULAR | Status: AC
Start: 2020-03-07 — End: 2020-03-06
  Administered 2020-03-07: 02:00:00 0.5 mg via INTRAVENOUS

## 2020-03-07 MED ORDER — MAGNESIUM CITRATE PO SOLN
Freq: Once | ORAL | Status: DC
Start: 2020-03-07 — End: 2020-03-07

## 2020-03-07 MED ORDER — MORPHINE SULFATE (PF) 4 MG/ML IJ SOLN
4 MG/ML | Freq: Once | INTRAMUSCULAR | Status: AC
Start: 2020-03-07 — End: 2020-03-06
  Administered 2020-03-07: 4 mg via INTRAVENOUS

## 2020-03-07 MED ORDER — ONDANSETRON HCL 4 MG/2ML IJ SOLN
4 MG/2ML | Freq: Once | INTRAMUSCULAR | Status: AC
Start: 2020-03-07 — End: 2020-03-06
  Administered 2020-03-07: 4 mg via INTRAVENOUS

## 2020-03-07 MED ORDER — IOPAMIDOL 76 % IV SOLN
76 % | Freq: Once | INTRAVENOUS | Status: AC | PRN
Start: 2020-03-07 — End: 2020-03-06
  Administered 2020-03-07: 01:00:00 95 mL via INTRAVENOUS

## 2020-03-07 MED ORDER — DOCUSATE SODIUM 100 MG PO CAPS
100 MG | ORAL_CAPSULE | Freq: Two times a day (BID) | ORAL | 0 refills | Status: AC
Start: 2020-03-07 — End: 2020-03-21

## 2020-03-07 MED ORDER — POLYETHYLENE GLYCOL 3350 17 GM/SCOOP PO POWD
17 GM/SCOOP | Freq: Every day | ORAL | 0 refills | Status: AC
Start: 2020-03-07 — End: 2020-03-10

## 2020-03-07 MED FILL — HYDROMORPHONE HCL 2 MG/ML IJ SOLN: 2 MG/ML | INTRAMUSCULAR | Qty: 1

## 2020-03-07 MED FILL — MORPHINE SULFATE 4 MG/ML IJ SOLN: 4 mg/mL | INTRAMUSCULAR | Qty: 1

## 2020-03-07 MED FILL — ISOVUE-370 76 % IV SOLN: 76 % | INTRAVENOUS | Qty: 100

## 2020-03-07 MED FILL — ONDANSETRON HCL 4 MG/2ML IJ SOLN: 4 MG/2ML | INTRAMUSCULAR | Qty: 2

## 2020-03-07 MED FILL — MAGNESIUM CITRATE 1.745 GM/30ML PO SOLN: 1.745 GM/30ML | ORAL | Qty: 296

## 2020-03-20 ENCOUNTER — Encounter: Payer: Self-pay | Admitting: Emergency Medicine

## 2020-03-20 ENCOUNTER — Other Ambulatory Visit: Payer: Self-pay

## 2020-03-20 ENCOUNTER — Ambulatory Visit: Payer: 59 | Admitting: Emergency Medicine

## 2020-03-20 VITALS — BP 112/72 | HR 65 | Temp 98.2°F | Resp 16 | Ht 63.0 in | Wt 194.0 lb

## 2020-03-20 DIAGNOSIS — I10 Essential (primary) hypertension: Secondary | ICD-10-CM

## 2020-03-20 DIAGNOSIS — L989 Disorder of the skin and subcutaneous tissue, unspecified: Secondary | ICD-10-CM | POA: Diagnosis not present

## 2020-03-20 MED ORDER — AMLODIPINE BESYLATE-VALSARTAN 10-320 MG PO TABS: 1.0000 | ORAL_TABLET | Freq: Every day | ORAL | 3 refills | Status: DC

## 2020-03-20 NOTE — Progress Notes (Signed)
Pamela Allen 44 y.o.   Chief Complaint  Patient presents with  . Hypertension  . Medication Refill    Amlodipine-Valsartan    HISTORY OF PRESENT ILLNESS: This is a 44 y.o. female with history of hypertension here for follow-up.  Doing well.  Has no complaints. Presently on amlodipine-valsartan 10-320 mg daily. Wt Readings from Last 3 Encounters:  03/20/20 194 lb (88 kg)  09/19/19 191 lb 6.4 oz (86.8 kg)  03/23/19 190 lb 9.6 oz (86.5 kg)    HPI   Prior to Admission medications   Medication Sig Start Date End Date Taking? Authorizing Provider  amLODipine-valsartan (EXFORGE) 10-320 MG tablet Take 1 tablet by mouth daily. 03/23/19  Yes Pamela Allen, Pamela Bloomer, MD  cetirizine (ZYRTEC) 10 MG tablet Take 1 tablet (10 mg total) by mouth at bedtime. 03/23/19  Yes Pamela Allen, Pamela Bloomer, MD  montelukast (SINGULAIR) 10 MG tablet Take 1 tablet (10 mg total) by mouth at bedtime. 03/23/19  Yes Pamela Allen, Pamela Bloomer, MD  Olopatadine HCl (PATADAY) 0.2 % SOLN Apply 1 drop to eye daily. 03/23/19  Yes Pamela Pollen, MD    Allergies  Allergen Reactions  . 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 Iron deficiency 03/02/2018  . Class 1 obesity due to excess calories with serious comorbidity and body mass index (BMI) of 34.0 to 34.9 in adult 03/02/2018  . Seasonal allergies 01/02/2018  . 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 Vitamin D deficiency 11/11/2014  . HTN (hypertension) 11/21/2011    Past Medical History:  Diagnosis Date  . Allergy   . Hypertension   . Mass    left buttock    Past Surgical History:  Procedure Laterality Date  . BREAST EXCISIONAL BIOPSY     right US guided  . MASS EXCISION Left 01/17/2016   Procedure: EXCISION BIOPSY OF MASS LEFT BUTTOCK ;  Surgeon: Pamela Ruff, MD;  Location: [facility];  Service: General;  Laterality: Left;  . TUBAL LIGATION Bilateral 10-30-2003   w/ Bilateral  Salpingectomy    Social History   Socioeconomic History  . Marital status: Married    Spouse name: Not on file  . Number of children: Not on file  . Years of education: Not on file  . Highest education level: Not on file  Occupational History  . Not on file  Tobacco Use  . Smoking status: Never Smoker  . Smokeless tobacco: Never Used  Substance and Sexual Activity  . Alcohol use: Yes    Comment: occasional  . Drug use: No  . Sexual activity: Not on file  Other Topics Concern  . Not on file  Social History Narrative   Married   Pamela Allen   Social Determinants of Health   Financial Resource Strain:   . Difficulty of Paying Living Expenses:   Food Insecurity:   . Worried About Charity fundraiser in the Last Year:   . Arboriculturist in the Last Year:   Transportation Needs:   . Film/video editor (Medical):   Marland Kitchen Lack of Transportation (Non-Medical):   Physical Activity:   . Days of Exercise per Week:   . Minutes of Exercise per Session:   Stress:   . Feeling of Stress :   Social Connections:   . Frequency of Communication with Friends and Family:   . Frequency of  Social Gatherings with Friends and Family:   . Attends Religious Services:   . Active Member of Clubs or Organizations:   . Attends Archivist Meetings:   Marland Kitchen Marital Status:   Intimate Partner Violence:   . Fear of Current or Ex-Partner:   . Emotionally Abused:   Marland Kitchen Physically Abused:   . Sexually Abused:     Family History  Problem Relation Age of Onset  . Diabetes Mother   . Heart disease Mother   . Hypertension Mother   . Stroke Mother   . Cancer Father   . Hypertension Father   . Stroke Sister      Review of Systems  Constitutional: Negative.  Negative for chills and fever.  HENT: Negative.  Negative for congestion and sore throat.   Respiratory: Negative.  Negative for cough and shortness of breath.   Cardiovascular: Negative.  Negative for chest  pain and palpitations.  Gastrointestinal: Negative for abdominal pain, nausea and vomiting.  Genitourinary: Negative.  Negative for dysuria and hematuria.  Skin:       Skin lesion on her back  Neurological: Negative.  Negative for dizziness and headaches.  All other systems reviewed and are negative.  Today's Vitals   03/20/20 1413  BP: 112/72  Pulse: 65  Resp: 16  Temp: 98.2 F (36.8 C)  TempSrc: Temporal  SpO2: 100%  Weight: 194 lb (88 kg)  Height: 5' 3"  (1.6 m)   Body mass index is 34.37 kg/m.   Physical Exam Vitals reviewed.  Constitutional:      Appearance: Normal appearance.  HENT:     Head: Normocephalic.  Eyes:     Extraocular Movements: Extraocular movements intact.     Conjunctiva/sclera: Conjunctivae normal.     Pupils: Pupils are equal, round, and reactive to light.  Cardiovascular:     Rate and Rhythm: Normal rate and regular rhythm.     Pulses: Normal pulses.     Heart sounds: Normal heart sounds.  Pulmonary:     Effort: Pulmonary effort is normal.     Breath sounds: Normal breath sounds.  Musculoskeletal:        General: Normal range of motion.     Cervical back: Normal range of motion and neck supple.  Skin:    Findings: Lesion present.     Comments: Hyper pigmented pedunculated round lesion to right mid back  Neurological:     General: No focal deficit present.     Mental Status: She is alert and oriented to person, place, and time.  Psychiatric:        Mood and Affect: Mood normal.        Behavior: Behavior normal.    A total of 30 minutes was spent with the patient, greater than 50% of which was in counseling/coordination of care regarding hypertension and cardiovascular risks associated with this condition, review of all medications, review of most recent office visit notes, review of most recent blood work results, diet and nutrition and importance of weight loss, prognosis and need for follow-up in 6 months.   ASSESSMENT & PLAN: HTN  (hypertension) Well-controlled hypertension.  Continue present medication.  No changes.  Pamela Allen was seen today for hypertension and medication refill.  Diagnoses and all orders for this visit:  Essential hypertension -     Comprehensive metabolic panel -     Lipid panel -     amLODipine-valsartan (EXFORGE) 10-320 MG tablet; Take 1 tablet by mouth daily.  Skin lesion of  back -     Ambulatory referral to Dermatology    Patient Instructions       If you have lab work done today you will be contacted with your lab results within the next 2 weeks.  If you have not heard from [place] then please contact us. The fastest way to get your results is to register for My Chart.   IF you received an x-ray today, you will receive an invoice from [facility]. Please contact [facility] at [phone] with questions or concerns regarding your invoice.   IF you received labwork today, you will receive an invoice from [facility]. Please contact [facility] at [phone] with questions or concerns regarding your invoice.   Our billing staff will not be able to assist you with questions regarding bills from these companies.  You will be contacted with the lab results as soon as they are available. The fastest way to get your results is to activate your My Chart account. Instructions are located on the last page of this paperwork. If you have not heard from [place] regarding the results in 2 weeks, please contact this office.     Hypertension, Adult High blood pressure (hypertension) is when the force of blood pumping through the arteries is too strong. The arteries are the blood vessels that carry blood from the heart throughout the body. Hypertension forces the heart to work harder to pump blood and may cause arteries to become narrow or stiff. Untreated or uncontrolled hypertension can cause a heart attack, heart failure, a stroke, kidney disease, and other problems. A blood pressure reading  consists of a higher number over a lower number. Ideally, your blood pressure should be below 120/80. The first ("top") number is called the systolic pressure. It is a measure of the pressure in your arteries as your heart beats. The second ("bottom") number is called the diastolic pressure. It is a measure of the pressure in your arteries as the heart relaxes. What are the causes? The exact cause of this condition is not known. There are some conditions that result in or are related to high blood pressure. What increases the risk? Some risk factors for high blood pressure are under your control. The following factors may make you more likely to develop this condition:  Smoking.  Having type 2 diabetes mellitus, high cholesterol, or both.  Not getting enough exercise or physical activity.  Being overweight.  Having too much fat, sugar, calories, or salt (sodium) in your diet.  Drinking too much alcohol. Some risk factors for high blood pressure may be difficult or impossible to change. Some of these factors include:  Having chronic kidney disease.  Having a family history of high blood pressure.  Age. Risk increases with age.  Race. You may be at higher risk if you are African American.  Gender. Men are at higher risk than women before age 22. After age 45, women are at higher risk than men.  Having obstructive sleep apnea.  Stress. What are the signs or symptoms? High blood pressure may not cause symptoms. Very high blood pressure (hypertensive crisis) may cause:  Headache.  Anxiety.  Shortness of breath.  Nosebleed.  Nausea and vomiting.  Vision changes.  Severe chest pain.  Seizures. How is this diagnosed? This condition is diagnosed by measuring your blood pressure while you are seated, with your arm resting on a flat surface, your legs uncrossed, and your feet flat on the floor. The cuff of the blood pressure  monitor will be placed directly against the skin of  your upper arm at the level of your heart. It should be measured at least twice using the same arm. Certain conditions can cause a difference in blood pressure between your right and left arms. Certain factors can cause blood pressure readings to be lower or higher than normal for a short period of time:  When your blood pressure is higher when you are in a health care provider's office than when you are at home, this is called white coat hypertension. Most people with this condition do not need medicines.  When your blood pressure is higher at home than when you are in a health care provider's office, this is called masked hypertension. Most people with this condition may need medicines to control blood pressure. If you have a high blood pressure reading during one visit or you have normal blood pressure with other risk factors, you may be asked to:  Return on a different day to have your blood pressure checked again.  Monitor your blood pressure at home for 1 week or longer. If you are diagnosed with hypertension, you may have other blood or imaging tests to help your health care provider understand your overall risk for other conditions. How is this treated? This condition is treated by making healthy lifestyle changes, such as eating healthy foods, exercising more, and reducing your alcohol intake. Your health care provider may prescribe medicine if lifestyle changes are not enough to get your blood pressure under control, and if:  Your systolic blood pressure is above 130.  Your diastolic blood pressure is above 80. Your personal target blood pressure may vary depending on your medical conditions, your age, and other factors. Follow these instructions at home: Eating and drinking   Eat a diet that is high in fiber and potassium, and low in sodium, added sugar, and fat. An example eating plan is called the DASH (Dietary Approaches to Stop Hypertension) diet. To eat this way: ? Eat plenty  of fresh fruits and vegetables. Try to fill one half of your plate at each meal with fruits and vegetables. ? Eat whole grains, such as whole-wheat pasta, brown rice, or whole-grain bread. Fill about one fourth of your plate with whole grains. ? Eat or drink low-fat dairy products, such as skim milk or low-fat yogurt. ? Avoid fatty cuts of meat, processed or cured meats, and poultry with skin. Fill about one fourth of your plate with lean proteins, such as fish, chicken without skin, beans, eggs, or tofu. ? Avoid pre-made and processed foods. These tend to be higher in sodium, added sugar, and fat.  Reduce your daily sodium intake. Most people with hypertension should eat less than 1,500 mg of sodium a day.  Do not drink alcohol if: ? Your health care provider tells you not to drink. ? You are pregnant, may be pregnant, or are planning to become pregnant.  If you drink alcohol: ? Limit how much you use to:  0-1 drink a day for women.  0-2 drinks a day for men. ? Be aware of how much alcohol is in your drink. In the [place] one drink equals one 12 oz bottle of beer (355 mL), one 5 oz glass of wine (148 mL), or one 1 oz glass of hard liquor (44 mL). Lifestyle   Work with your health care provider to maintain a healthy body weight or to lose weight. Ask what an ideal weight is for you.  Get at least 30 minutes of exercise most days of the week. Activities may include walking, swimming, or biking.  Include exercise to strengthen your muscles (resistance exercise), such as Pilates or lifting weights, as part of your weekly exercise routine. Try to do these types of exercises for 30 minutes at least 3 days a week.  Do not use any products that contain nicotine or tobacco, such as cigarettes, e-cigarettes, and chewing tobacco. If you need help quitting, ask your health care provider.  Monitor your blood pressure at home as told by your health care provider.  Keep all follow-up visits as told  by your health care provider. This is important. Medicines  Take over-the-counter and prescription medicines only as told by your health care provider. Follow directions carefully. Blood pressure medicines must be taken as prescribed.  Do not skip doses of blood pressure medicine. Doing this puts you at risk for problems and can make the medicine less effective.  Ask your health care provider about side effects or reactions to medicines that you should watch for. Contact a health care provider if you:  Think you are having a reaction to a medicine you are taking.  Have headaches that keep coming back (recurring).  Feel dizzy.  Have swelling in your ankles.  Have trouble with your vision. Get help right away if you:  Develop a severe headache or confusion.  Have unusual weakness or numbness.  Feel faint.  Have severe pain in your chest or abdomen.  Vomit repeatedly.  Have trouble breathing. Summary  Hypertension is when the force of blood pumping through your arteries is too strong. If this condition is not controlled, it may put you at risk for serious complications.  Your personal target blood pressure may vary depending on your medical conditions, your age, and other factors. For most people, a normal blood pressure is less than 120/80.  Hypertension is treated with lifestyle changes, medicines, or a combination of both. Lifestyle changes include losing weight, eating a healthy, low-sodium diet, exercising more, and limiting alcohol. This information is not intended to replace advice given to you by your health care provider. Make sure you discuss any questions you have with your health care provider. Document Revised: 05/12/2018 Document Reviewed: 05/12/2018 Elsevier Patient Education  2020 [employer]       Agustina Caroli, MD [facility]

## 2020-03-20 NOTE — Patient Instructions (Addendum)
If you have lab work done today you will be contacted with your lab results within the next 2 weeks.  If you have not heard from [place] then please contact us. The fastest way to get your results is to register for My Chart.   IF you received an x-ray today, you will receive an invoice from [facility]. Please contact [facility] at [phone] with questions or concerns regarding your invoice.   IF you received labwork today, you will receive an invoice from [facility]. Please contact [facility] at [phone] with questions or concerns regarding your invoice.   Our billing staff will not be able to assist you with questions regarding bills from these companies.  You will be contacted with the lab results as soon as they are available. The fastest way to get your results is to activate your My Chart account. Instructions are located on the last page of this paperwork. If you have not heard from [place] regarding the results in 2 weeks, please contact this office.     Hypertension, Adult High blood pressure (hypertension) is when the force of blood pumping through the arteries is too strong. The arteries are the blood vessels that carry blood from the heart throughout the body. Hypertension forces the heart to work harder to pump blood and may cause arteries to become narrow or stiff. Untreated or uncontrolled hypertension can cause a heart attack, heart failure, a stroke, kidney disease, and other problems. A blood pressure reading consists of a higher number over a lower number. Ideally, your blood pressure should be below 120/80. The first ("top") number is called the systolic pressure. It is a measure of the pressure in your arteries as your heart beats. The second ("bottom") number is called the diastolic pressure. It is a measure of the pressure in your arteries as the heart relaxes. What are the causes? The exact cause of this condition is not known. There are some conditions  that result in or are related to high blood pressure. What increases the risk? Some risk factors for high blood pressure are under your control. The following factors may make you more likely to develop this condition:  Smoking.  Having type 2 diabetes mellitus, high cholesterol, or both.  Not getting enough exercise or physical activity.  Being overweight.  Having too much fat, sugar, calories, or salt (sodium) in your diet.  Drinking too much alcohol. Some risk factors for high blood pressure may be difficult or impossible to change. Some of these factors include:  Having chronic kidney disease.  Having a family history of high blood pressure.  Age. Risk increases with age.  Race. You may be at higher risk if you are African American.  Gender. Men are at higher risk than women before age 102. After age 80, women are at higher risk than men.  Having obstructive sleep apnea.  Stress. What are the signs or symptoms? High blood pressure may not cause symptoms. Very high blood pressure (hypertensive crisis) may cause:  Headache.  Anxiety.  Shortness of breath.  Nosebleed.  Nausea and vomiting.  Vision changes.  Severe chest pain.  Seizures. How is this diagnosed? This condition is diagnosed by measuring your blood pressure while you are seated, with your arm resting on a flat surface, your legs uncrossed, and your feet flat on the floor. The cuff of the blood pressure monitor will be placed directly against the skin of your upper arm at the level of your heart.  It should be measured at least twice using the same arm. Certain conditions can cause a difference in blood pressure between your right and left arms. Certain factors can cause blood pressure readings to be lower or higher than normal for a short period of time:  When your blood pressure is higher when you are in a health care provider's office than when you are at home, this is called white coat hypertension.  Most people with this condition do not need medicines.  When your blood pressure is higher at home than when you are in a health care provider's office, this is called masked hypertension. Most people with this condition may need medicines to control blood pressure. If you have a high blood pressure reading during one visit or you have normal blood pressure with other risk factors, you may be asked to:  Return on a different day to have your blood pressure checked again.  Monitor your blood pressure at home for 1 week or longer. If you are diagnosed with hypertension, you may have other blood or imaging tests to help your health care provider understand your overall risk for other conditions. How is this treated? This condition is treated by making healthy lifestyle changes, such as eating healthy foods, exercising more, and reducing your alcohol intake. Your health care provider may prescribe medicine if lifestyle changes are not enough to get your blood pressure under control, and if:  Your systolic blood pressure is above 130.  Your diastolic blood pressure is above 80. Your personal target blood pressure may vary depending on your medical conditions, your age, and other factors. Follow these instructions at home: Eating and drinking   Eat a diet that is high in fiber and potassium, and low in sodium, added sugar, and fat. An example eating plan is called the DASH (Dietary Approaches to Stop Hypertension) diet. To eat this way: ? Eat plenty of fresh fruits and vegetables. Try to fill one half of your plate at each meal with fruits and vegetables. ? Eat whole grains, such as whole-wheat pasta, brown rice, or whole-grain bread. Fill about one fourth of your plate with whole grains. ? Eat or drink low-fat dairy products, such as skim milk or low-fat yogurt. ? Avoid fatty cuts of meat, processed or cured meats, and poultry with skin. Fill about one fourth of your plate with lean proteins, such  as fish, chicken without skin, beans, eggs, or tofu. ? Avoid pre-made and processed foods. These tend to be higher in sodium, added sugar, and fat.  Reduce your daily sodium intake. Most people with hypertension should eat less than 1,500 mg of sodium a day.  Do not drink alcohol if: ? Your health care provider tells you not to drink. ? You are pregnant, may be pregnant, or are planning to become pregnant.  If you drink alcohol: ? Limit how much you use to:  0-1 drink a day for women.  0-2 drinks a day for men. ? Be aware of how much alcohol is in your drink. In the [place] one drink equals one 12 oz bottle of beer (355 mL), one 5 oz glass of wine (148 mL), or one 1 oz glass of hard liquor (44 mL). Lifestyle   Work with your health care provider to maintain a healthy body weight or to lose weight. Ask what an ideal weight is for you.  Get at least 30 minutes of exercise most days of the week. Activities may include walking, swimming, or  biking.  Include exercise to strengthen your muscles (resistance exercise), such as Pilates or lifting weights, as part of your weekly exercise routine. Try to do these types of exercises for 30 minutes at least 3 days a week.  Do not use any products that contain nicotine or tobacco, such as cigarettes, e-cigarettes, and chewing tobacco. If you need help quitting, ask your health care provider.  Monitor your blood pressure at home as told by your health care provider.  Keep all follow-up visits as told by your health care provider. This is important. Medicines  Take over-the-counter and prescription medicines only as told by your health care provider. Follow directions carefully. Blood pressure medicines must be taken as prescribed.  Do not skip doses of blood pressure medicine. Doing this puts you at risk for problems and can make the medicine less effective.  Ask your health care provider about side effects or reactions to medicines that you  should watch for. Contact a health care provider if you:  Think you are having a reaction to a medicine you are taking.  Have headaches that keep coming back (recurring).  Feel dizzy.  Have swelling in your ankles.  Have trouble with your vision. Get help right away if you:  Develop a severe headache or confusion.  Have unusual weakness or numbness.  Feel faint.  Have severe pain in your chest or abdomen.  Vomit repeatedly.  Have trouble breathing. Summary  Hypertension is when the force of blood pumping through your arteries is too strong. If this condition is not controlled, it may put you at risk for serious complications.  Your personal target blood pressure may vary depending on your medical conditions, your age, and other factors. For most people, a normal blood pressure is less than 120/80.  Hypertension is treated with lifestyle changes, medicines, or a combination of both. Lifestyle changes include losing weight, eating a healthy, low-sodium diet, exercising more, and limiting alcohol. This information is not intended to replace advice given to you by your health care provider. Make sure you discuss any questions you have with your health care provider. Document Revised: 05/12/2018 Document Reviewed: 05/12/2018 Elsevier Patient Education  2020 [employer].

## 2020-03-20 NOTE — Assessment & Plan Note (Signed)
Well-controlled hypertension.  Continue present medication.  No changes.

## 2020-04-09 ENCOUNTER — Encounter: Payer: Self-pay | Admitting: Emergency Medicine

## 2020-04-09 NOTE — Telephone Encounter (Signed)
Pt mother passed and pt is having hard time sleeping and keeping her composure wants to know if theres something she can take

## 2020-04-10 ENCOUNTER — Other Ambulatory Visit: Payer: Self-pay | Admitting: Emergency Medicine

## 2020-04-10 MED ORDER — ALPRAZOLAM 0.5 MG PO TABS: 0.5000 mg | ORAL_TABLET | Freq: Two times a day (BID) | ORAL | 1 refills | Status: DC | PRN

## 2020-04-10 NOTE — Telephone Encounter (Signed)
Prescription for Xanax sent to pharmacy of record.  Thanks.

## 2020-04-19 ENCOUNTER — Other Ambulatory Visit: Payer: Self-pay | Admitting: Family Medicine

## 2020-04-19 DIAGNOSIS — Z1231 Encounter for screening mammogram for malignant neoplasm of breast: Secondary | ICD-10-CM

## 2020-05-11 ENCOUNTER — Other Ambulatory Visit: Payer: Self-pay

## 2020-05-11 ENCOUNTER — Ambulatory Visit
Admission: RE | Admit: 2020-05-11 | Discharge: 2020-05-11 | Disposition: A | Discharge status: Home / Self Care | Payer: 59 | Source: Ambulatory Visit | Attending: Family Medicine | Admitting: Family Medicine

## 2020-05-11 DIAGNOSIS — Z1231 Encounter for screening mammogram for malignant neoplasm of breast: Secondary | ICD-10-CM

## 2020-08-07 ENCOUNTER — Telehealth: Payer: Self-pay

## 2020-08-07 ENCOUNTER — Encounter: Payer: Self-pay | Admitting: Emergency Medicine

## 2020-08-07 NOTE — Telephone Encounter (Signed)
LVMTCB to schedule app for anxiety

## 2020-08-22 ENCOUNTER — Ambulatory Visit (INDEPENDENT_AMBULATORY_CARE_PROVIDER_SITE_OTHER): Payer: 59 | Admitting: Emergency Medicine

## 2020-08-22 ENCOUNTER — Encounter: Payer: Self-pay | Admitting: Emergency Medicine

## 2020-08-22 ENCOUNTER — Other Ambulatory Visit: Payer: Self-pay

## 2020-08-22 VITALS — BP 118/80 | HR 72 | Temp 98.3°F | Ht 63.0 in | Wt 195.0 lb

## 2020-08-22 DIAGNOSIS — I10 Essential (primary) hypertension: Secondary | ICD-10-CM

## 2020-08-22 DIAGNOSIS — F4323 Adjustment disorder with mixed anxiety and depressed mood: Secondary | ICD-10-CM | POA: Diagnosis not present

## 2020-08-22 MED ORDER — ESCITALOPRAM OXALATE 10 MG PO TABS: 10.0000 mg | ORAL_TABLET | Freq: Every day | ORAL | 1 refills | Status: DC

## 2020-08-22 MED ORDER — ALPRAZOLAM 0.5 MG PO TABS: 0.5000 mg | ORAL_TABLET | Freq: Two times a day (BID) | ORAL | 1 refills | Status: DC | PRN

## 2020-08-22 NOTE — Progress Notes (Signed)
Pamela Allen 44 y.o.   Chief Complaint  Patient presents with  . Anxiety    xanax refill, screenings done    HISTORY OF PRESENT ILLNESS: This is a 44 y.o. female anxiety and depression since mother passed away last 20-Apr-2023. Also has history of hypertension, on medication.  Doing well. Depression screen [facility] 2/9 08/22/2020 03/20/2020 09/19/2019 03/23/2019 01/02/2018  Decreased Interest 2 1 0 0 0  Down, Depressed, Hopeless 3 1 0 0 0  PHQ - 2 Score 5 2 0 0 0  Altered sleeping 3 0 - - -  Tired, decreased energy 2 1 - - -  Change in appetite 2 1 - - -  Feeling bad or failure about yourself  1 0 - - -  Trouble concentrating 1 0 - - -  Moving slowly or fidgety/restless 1 0 - - -  Suicidal thoughts 0 0 - - -  PHQ-9 Score 15 4 - - -  Difficult doing work/chores Somewhat difficult - - - -   GAD 7 : Generalized Anxiety Score 08/22/2020  Nervous, Anxious, on Edge 2  Control/stop worrying 3  Worry too much - different things 3  Trouble relaxing 2  Restless 2  Easily annoyed or irritable 3  Afraid - awful might happen 1  Total GAD 7 Score 16  Anxiety Difficulty Somewhat difficult      HPI   Prior to Admission medications   Medication Sig Start Date End Date Taking? Authorizing Provider  ALPRAZolam Duanne Moron) 0.5 MG tablet Take 1 tablet (0.5 mg total) by mouth 2 (two) times daily as needed for anxiety. 04/10/20  Yes Izsak Meir, Ines Bloomer, MD  amLODipine-valsartan (EXFORGE) 10-320 MG tablet Take 1 tablet by mouth daily. 03/20/20  Yes Sohail Capraro, Ines Bloomer, MD  cetirizine (ZYRTEC) 10 MG tablet Take 1 tablet (10 mg total) by mouth at bedtime. 03/23/19  Yes Maks Cavallero, Ines Bloomer, MD  montelukast (SINGULAIR) 10 MG tablet Take 1 tablet (10 mg total) by mouth at bedtime. 03/23/19  Yes Jessabelle Markiewicz, Ines Bloomer, MD  Olopatadine HCl (PATADAY) 0.2 % SOLN Apply 1 drop to eye daily. 03/23/19  Yes Horald Pollen, MD    Allergies  Allergen Reactions  . 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 Iron deficiency 03/02/2018  . Class 1 obesity due to excess calories with serious comorbidity and body mass index (BMI) of 34.0 to 34.9 in adult 03/02/2018  . Seasonal allergies 01/02/2018  . 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 Vitamin D deficiency 11/11/2014  . HTN (hypertension) 11/21/2011    Past Medical History:  Diagnosis Date  . Allergy   . Hypertension   . Mass    left buttock    Past Surgical History:  Procedure Laterality Date  . BREAST EXCISIONAL BIOPSY     right US guided  . MASS EXCISION Left 01/17/2016   Procedure: EXCISION BIOPSY OF MASS LEFT BUTTOCK ;  Surgeon: Leighton Ruff, MD;  Location: [facility];  Service: General;  Laterality: Left;  . TUBAL LIGATION Bilateral 10-30-2003   w/ Bilateral Salpingectomy    Social History   Socioeconomic History  . Marital status: Married    Spouse name: Not on file  . Number of children: Not on file  . Years of education: Not on file  . Highest education level: Not on file  Occupational History  . Not on file  Tobacco Use  .  Smoking status: Never Smoker  . Smokeless tobacco: Never Used  Substance and Sexual Activity  . Alcohol use: Yes    Comment: occasional  . Drug use: No  . Sexual activity: Not on file  Other Topics Concern  . Not on file  Social History Narrative   Married   Cytogeneticist / CNA   Social Determinants of Health   Financial Resource Strain:   . Difficulty of Paying Living Expenses: Not on file  Food Insecurity:   . Worried About Charity fundraiser in the Last Year: Not on file  . Ran Out of Food in the Last Year: Not on file  Transportation Needs:   . Lack of Transportation (Medical): Not on file  . Lack of Transportation (Non-Medical): Not on file  Physical Activity:   . Days of Exercise per Week: Not on file  . Minutes of Exercise per Session: Not on file  Stress:   . Feeling of Stress : Not on file   Social Connections:   . Frequency of Communication with Friends and Family: Not on file  . Frequency of Social Gatherings with Friends and Family: Not on file  . Attends Religious Services: Not on file  . Active Member of Clubs or Organizations: Not on file  . Attends Archivist Meetings: Not on file  . Marital Status: Not on file  Intimate Partner Violence:   . Fear of Current or Ex-Partner: Not on file  . Emotionally Abused: Not on file  . Physically Abused: Not on file  . Sexually Abused: Not on file    Family History  Problem Relation Age of Onset  . Diabetes Mother   . Heart disease Mother   . Hypertension Mother   . Stroke Mother   . Cancer Father   . Hypertension Father   . Stroke Sister      Review of Systems  Constitutional: Negative.  Negative for chills and fever.  HENT: Negative.  Negative for congestion and sore throat.   Respiratory: Negative.  Negative for cough and shortness of breath.   Cardiovascular: Negative.  Negative for chest pain and palpitations.  Gastrointestinal: Negative.  Negative for abdominal pain, diarrhea, nausea and vomiting.  Genitourinary: Negative.  Negative for dysuria and hematuria.  Musculoskeletal: Negative.   Skin: Negative.  Negative for rash.  Neurological: Negative.  Negative for dizziness and headaches.  Psychiatric/Behavioral: Positive for depression. The patient is nervous/anxious.   All other systems reviewed and are negative.  Vitals:   08/22/20 1050  BP: 118/80  Pulse: 72  Temp: 98.3 F (36.8 C)  SpO2: 99%     Physical Exam Vitals reviewed.  Constitutional:      Appearance: Normal appearance.  HENT:     Head: Normocephalic.  Eyes:     Extraocular Movements: Extraocular movements intact.     Pupils: Pupils are equal, round, and reactive to light.  Cardiovascular:     Rate and Rhythm: Normal rate and regular rhythm.     Pulses: Normal pulses.     Heart sounds: Normal heart sounds.  Pulmonary:      Effort: Pulmonary effort is normal.     Breath sounds: Normal breath sounds.  Musculoskeletal:        General: Normal range of motion.     Cervical back: Normal range of motion and neck supple.  Skin:    General: Skin is warm and dry.     Capillary Refill: Capillary refill  takes less than 2 seconds.  Neurological:     General: No focal deficit present.     Mental Status: She is alert and oriented to person, place, and time.  Psychiatric:        Mood and Affect: Mood normal.        Behavior: Behavior normal.    A total of 30 minutes was spent with the patient, greater than 50% of which was in counseling/coordination of care regarding depression and anxiety, treatment, medications including recommendation of starting Lexapro 10 mg daily, hypertension and need to continue taking her medication, review of most recent office visit notes, review of most recent blood work results, prognosis, documentation, need for follow-up..   ASSESSMENT & PLAN: Melanee was seen today for anxiety.  Diagnoses and all orders for this visit:  Situational mixed anxiety and depressive disorder -     ALPRAZolam (XANAX) 0.5 MG tablet; Take 1 tablet (0.5 mg total) by mouth 2 (two) times daily as needed for anxiety. -     escitalopram (LEXAPRO) 10 MG tablet; Take 1 tablet (10 mg total) by mouth daily.  Essential hypertension    Patient Instructions       If you have lab work done today you will be contacted with your lab results within the next 2 weeks.  If you have not heard from [place] then please contact us. The fastest way to get your results is to register for My Chart.   IF you received an x-ray today, you will receive an invoice from [facility]. Please contact [facility] at [phone] with questions or concerns regarding your invoice.   IF you received labwork today, you will receive an invoice from [facility]. Please contact [facility] at [phone] with questions or concerns  regarding your invoice.   Our billing staff will not be able to assist you with questions regarding bills from these companies.  You will be contacted with the lab results as soon as they are available. The fastest way to get your results is to activate your My Chart account. Instructions are located on the last page of this paperwork. If you have not heard from [place] regarding the results in 2 weeks, please contact this office.      Living With Depression Everyone experiences occasional disappointment, sadness, and loss in their lives. When you are feeling down, blue, or sad for at least 2 weeks in a row, it may mean that you have depression. Depression can affect your thoughts and feelings, relationships, daily activities, and physical health. It is caused by changes in the way your brain functions. If you receive a diagnosis of depression, your health care provider will tell you which type of depression you have and what treatment options are available to you. If you are living with depression, there are ways to help you recover from it and also ways to prevent it from coming back. How to cope with lifestyle changes Coping with stress     Stress is your body's reaction to life changes and events, both good and bad. Stressful situations may include:  Getting married.  The death of a spouse.  Losing a job.  Retiring.  Having a baby. Stress can last just a few hours or it can be ongoing. Stress can play a major role in depression, so it is important to learn both how to cope with stress and how to think about it differently. Talk with your health care provider or a counselor if you would like  to learn more about stress reduction. He or she may suggest some stress reduction techniques, such as:  Music therapy. This can include creating music or listening to music. Choose music that you enjoy and that inspires you.  Mindfulness-based meditation. This kind of meditation can be done while  sitting or walking. It involves being aware of your normal breaths, rather than trying to control your breathing.  Centering prayer. This is a kind of meditation that involves focusing on a spiritual word or phrase. Choose a word, phrase, or sacred image that is meaningful to you and that brings you peace.  Deep breathing. To do this, expand your stomach and inhale slowly through your nose. Hold your breath for 3-5 seconds, then exhale slowly, allowing your stomach muscles to relax.  Muscle relaxation. This involves intentionally tensing muscles then relaxing them. Choose a stress reduction technique that fits your lifestyle and personality. Stress reduction techniques take time and practice to develop. Set aside 5-15 minutes a day to do them. Therapists can offer training in these techniques. The training may be covered by some insurance plans. Other things you can do to manage stress include:  Keeping a stress diary. This can help you learn what triggers your stress and ways to control your response.  Understanding what your limits are and saying no to requests or events that lead to a schedule that is too full.  Thinking about how you respond to certain situations. You may not be able to control everything, but you can control how you react.  Adding humor to your life by watching funny films or TV shows.  Making time for activities that help you relax and not feeling guilty about spending your time this way.  Medicines Your health care provider may suggest certain medicines if he or she feels that they will help improve your condition. Avoid using alcohol and other substances that may prevent your medicines from working properly (may interact). It is also important to:  Talk with your pharmacist or health care provider about all the medicines that you take, their possible side effects, and what medicines are safe to take together.  Make it your goal to take part in all treatment decisions  (shared decision-making). This includes giving input on the side effects of medicines. It is best if shared decision-making with your health care provider is part of your total treatment plan. If your health care provider prescribes a medicine, you may not notice the full benefits of it for 4-8 weeks. Most people who are treated for depression need to be on medicine for at least 6-12 months after they feel better. If you are taking medicines as part of your treatment, do not stop taking medicines without first talking to your health care provider. You may need to have the medicine slowly decreased (tapered) over time to decrease the risk of harmful side effects. Relationships Your health care provider may suggest family therapy along with individual therapy and drug therapy. While there may not be family problems that are causing you to feel depressed, it is still important to make sure your family learns as much as they can about your mental health. Having your family's support can help make your treatment successful. How to recognize changes in your condition Everyone has a different response to treatment for depression. Recovery from major depression happens when you have not had signs of major depression for two months. This may mean that you will start to:  Have more interest in doing  activities.  Feel less hopeless than you did 2 months ago.  Have more energy.  Overeat less often, or have better or improving appetite.  Have better concentration. Your health care provider will work with you to decide the next steps in your recovery. It is also important to recognize when your condition is getting worse. Watch for these signs:  Having fatigue or low energy.  Eating too much or too little.  Sleeping too much or too little.  Feeling restless, agitated, or hopeless.  Having trouble concentrating or making decisions.  Having unexplained physical complaints.  Feeling irritable, angry, or  aggressive. Get help as soon as you or your family members notice these symptoms coming back. How to get support and help from others How to talk with friends and family members about your condition  Talking to friends and family members about your condition can provide you with one way to get support and guidance. Reach out to trusted friends or family members, explain your symptoms to them, and let them know that you are working with a health care provider to treat your depression. Financial resources Not all insurance plans cover mental health care, so it is important to check with your insurance carrier. If paying for co-pays or counseling services is a problem, search for a local or county mental health care center. They may be able to offer public mental health care services at low or no cost when you are not able to see a private health care provider. If you are taking medicine for depression, you may be able to get the generic form, which may be less expensive. Some makers of prescription medicines also offer help to patients who cannot afford the medicines they need. Follow these instructions at home:   Get the right amount and quality of sleep.  Cut down on using caffeine, tobacco, alcohol, and other potentially harmful substances.  Try to exercise, such as walking or lifting small weights.  Take over-the-counter and prescription medicines only as told by your health care provider.  Eat a healthy diet that includes plenty of vegetables, fruits, whole grains, low-fat dairy products, and lean protein. Do not eat a lot of foods that are high in solid fats, added sugars, or salt.  Keep all follow-up visits as told by your health care provider. This is important. Contact a health care provider if:  You stop taking your antidepressant medicines, and you have any of these symptoms: ? Nausea. ? Headache. ? Feeling lightheaded. ? Chills and body aches. ? Not being able to sleep  (insomnia).  You or your friends and family think your depression is getting worse. Get help right away if:  You have thoughts of hurting yourself or others. If you ever feel like you may hurt yourself or others, or have thoughts about taking your own life, get help right away. You can go to your nearest emergency department or call:  Your local emergency services (911 in the [place]  A suicide crisis helpline, such as the [facility] at (509) 472-2100. This is open 24-hours a day. Summary  If you are living with depression, there are ways to help you recover from it and also ways to prevent it from coming back.  Work with your health care team to create a management plan that includes counseling, stress management techniques, and healthy lifestyle habits. This information is not intended to replace advice given to you by your health care provider. Make sure you discuss any questions  you have with your health care provider. Document Revised: 12/24/2018 Document Reviewed: 08/04/2016 Elsevier Patient Education  2020 [employer]      Agustina Caroli, MD [facility]

## 2020-08-22 NOTE — Patient Instructions (Addendum)
If you have lab work done today you will be contacted with your lab results within the next 2 weeks.  If you have not heard from [place] then please contact us. The fastest way to get your results is to register for My Chart.   IF you received an x-ray today, you will receive an invoice from [facility]. Please contact [facility] at [phone] with questions or concerns regarding your invoice.   IF you received labwork today, you will receive an invoice from [place]. Please contact [facility] at [phone] with questions or concerns regarding your invoice.   Our billing staff will not be able to assist you with questions regarding bills from these companies.  You will be contacted with the lab results as soon as they are available. The fastest way to get your results is to activate your My Chart account. Instructions are located on the last page of this paperwork. If you have not heard from [place] regarding the results in 2 weeks, please contact this office.      Living With Depression Everyone experiences occasional disappointment, sadness, and loss in their lives. When you are feeling down, blue, or sad for at least 2 weeks in a row, it may mean that you have depression. Depression can affect your thoughts and feelings, relationships, daily activities, and physical health. It is caused by changes in the way your brain functions. If you receive a diagnosis of depression, your health care provider will tell you which type of depression you have and what treatment options are available to you. If you are living with depression, there are ways to help you recover from it and also ways to prevent it from coming back. How to cope with lifestyle changes Coping with stress     Stress is your body's reaction to life changes and events, both good and bad. Stressful situations may include:  Getting married.  The death of a spouse.  Losing a job.  Retiring.  Having a  baby. Stress can last just a few hours or it can be ongoing. Stress can play a major role in depression, so it is important to learn both how to cope with stress and how to think about it differently. Talk with your health care provider or a counselor if you would like to learn more about stress reduction. He or she may suggest some stress reduction techniques, such as:  Music therapy. This can include creating music or listening to music. Choose music that you enjoy and that inspires you.  Mindfulness-based meditation. This kind of meditation can be done while sitting or walking. It involves being aware of your normal breaths, rather than trying to control your breathing.  Centering prayer. This is a kind of meditation that involves focusing on a spiritual word or phrase. Choose a word, phrase, or sacred image that is meaningful to you and that brings you peace.  Deep breathing. To do this, expand your stomach and inhale slowly through your nose. Hold your breath for 3-5 seconds, then exhale slowly, allowing your stomach muscles to relax.  Muscle relaxation. This involves intentionally tensing muscles then relaxing them. Choose a stress reduction technique that fits your lifestyle and personality. Stress reduction techniques take time and practice to develop. Set aside 5-15 minutes a day to do them. Therapists can offer training in these techniques. The training may be covered by some insurance plans. Other things you can do to manage stress include:  Keeping a stress diary.  This can help you learn what triggers your stress and ways to control your response.  Understanding what your limits are and saying no to requests or events that lead to a schedule that is too full.  Thinking about how you respond to certain situations. You may not be able to control everything, but you can control how you react.  Adding humor to your life by watching funny films or TV shows.  Making time for activities  that help you relax and not feeling guilty about spending your time this way.  Medicines Your health care provider may suggest certain medicines if he or she feels that they will help improve your condition. Avoid using alcohol and other substances that may prevent your medicines from working properly (may interact). It is also important to:  Talk with your pharmacist or health care provider about all the medicines that you take, their possible side effects, and what medicines are safe to take together.  Make it your goal to take part in all treatment decisions (shared decision-making). This includes giving input on the side effects of medicines. It is best if shared decision-making with your health care provider is part of your total treatment plan. If your health care provider prescribes a medicine, you may not notice the full benefits of it for 4-8 weeks. Most people who are treated for depression need to be on medicine for at least 6-12 months after they feel better. If you are taking medicines as part of your treatment, do not stop taking medicines without first talking to your health care provider. You may need to have the medicine slowly decreased (tapered) over time to decrease the risk of harmful side effects. Relationships Your health care provider may suggest family therapy along with individual therapy and drug therapy. While there may not be family problems that are causing you to feel depressed, it is still important to make sure your family learns as much as they can about your mental health. Having your family's support can help make your treatment successful. How to recognize changes in your condition Everyone has a different response to treatment for depression. Recovery from major depression happens when you have not had signs of major depression for two months. This may mean that you will start to:  Have more interest in doing activities.  Feel less hopeless than you did 2 months  ago.  Have more energy.  Overeat less often, or have better or improving appetite.  Have better concentration. Your health care provider will work with you to decide the next steps in your recovery. It is also important to recognize when your condition is getting worse. Watch for these signs:  Having fatigue or low energy.  Eating too much or too little.  Sleeping too much or too little.  Feeling restless, agitated, or hopeless.  Having trouble concentrating or making decisions.  Having unexplained physical complaints.  Feeling irritable, angry, or aggressive. Get help as soon as you or your family members notice these symptoms coming back. How to get support and help from others How to talk with friends and family members about your condition  Talking to friends and family members about your condition can provide you with one way to get support and guidance. Reach out to trusted friends or family members, explain your symptoms to them, and let them know that you are working with a health care provider to treat your depression. Financial resources Not all insurance plans cover mental health care, so it  is important to check with your insurance carrier. If paying for co-pays or counseling services is a problem, search for a local or county mental health care center. They may be able to offer public mental health care services at low or no cost when you are not able to see a private health care provider. If you are taking medicine for depression, you may be able to get the generic form, which may be less expensive. Some makers of prescription medicines also offer help to patients who cannot afford the medicines they need. Follow these instructions at home:   Get the right amount and quality of sleep.  Cut down on using caffeine, tobacco, alcohol, and other potentially harmful substances.  Try to exercise, such as walking or lifting small weights.  Take over-the-counter and  prescription medicines only as told by your health care provider.  Eat a healthy diet that includes plenty of vegetables, fruits, whole grains, low-fat dairy products, and lean protein. Do not eat a lot of foods that are high in solid fats, added sugars, or salt.  Keep all follow-up visits as told by your health care provider. This is important. Contact a health care provider if:  You stop taking your antidepressant medicines, and you have any of these symptoms: ? Nausea. ? Headache. ? Feeling lightheaded. ? Chills and body aches. ? Not being able to sleep (insomnia).  You or your friends and family think your depression is getting worse. Get help right away if:  You have thoughts of hurting yourself or others. If you ever feel like you may hurt yourself or others, or have thoughts about taking your own life, get help right away. You can go to your nearest emergency department or call:  Your local emergency services (911 in the [place]  A suicide crisis helpline, such as the [place] at 949-239-1254. This is open 24-hours a day. Summary  If you are living with depression, there are ways to help you recover from it and also ways to prevent it from coming back.  Work with your health care team to create a management plan that includes counseling, stress management techniques, and healthy lifestyle habits. This information is not intended to replace advice given to you by your health care provider. Make sure you discuss any questions you have with your health care provider. Document Revised: 12/24/2018 Document Reviewed: 08/04/2016 Elsevier Patient Education  [place].

## 2020-09-09 ENCOUNTER — Other Ambulatory Visit: Payer: Self-pay

## 2020-09-09 ENCOUNTER — Encounter (HOSPITAL_COMMUNITY): Payer: Self-pay | Admitting: Emergency Medicine

## 2020-09-09 ENCOUNTER — Ambulatory Visit (HOSPITAL_COMMUNITY)
Admission: EM | Admit: 2020-09-09 | Discharge: 2020-09-09 | Disposition: A | Discharge status: Home / Self Care | Payer: 59 | Attending: Physician Assistant | Admitting: Physician Assistant

## 2020-09-09 DIAGNOSIS — S8002XA Contusion of left knee, initial encounter: Secondary | ICD-10-CM | POA: Diagnosis not present

## 2020-09-09 DIAGNOSIS — S43401A Unspecified sprain of right shoulder joint, initial encounter: Secondary | ICD-10-CM | POA: Diagnosis not present

## 2020-09-09 DIAGNOSIS — S29019A Strain of muscle and tendon of unspecified wall of thorax, initial encounter: Secondary | ICD-10-CM | POA: Diagnosis not present

## 2020-09-09 MED ORDER — CYCLOBENZAPRINE HCL 5 MG PO TABS: 5.0000 mg | ORAL_TABLET | Freq: Three times a day (TID) | ORAL | 0 refills | Status: DC | PRN

## 2020-09-09 MED ORDER — DICLOFENAC SODIUM 75 MG PO TBEC: 75.0000 mg | DELAYED_RELEASE_TABLET | Freq: Two times a day (BID) | ORAL | 0 refills | Status: DC
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
## 2020-09-09 NOTE — Discharge Instructions (Signed)
Do not take xanax with your muscle relaxer Muscle relaxer will make you tired - no alcohol or driving while on it. Apply ice and heat to shoulder and back 15 minutes 4 times per day Apply ice to knee 15 minutes 4 times per day Stretch back and shoulder daily.
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
## 2020-09-15 ENCOUNTER — Encounter: Payer: Self-pay | Admitting: Emergency Medicine

## 2020-09-18 ENCOUNTER — Ambulatory Visit: Payer: 59 | Admitting: Emergency Medicine

## 2020-09-19 ENCOUNTER — Ambulatory Visit: Payer: 59 | Admitting: Emergency Medicine

## 2020-11-13 ENCOUNTER — Inpatient Hospital Stay
Admit: 2020-11-13 | Discharge: 2020-11-13 | Disposition: A | Discharge status: Home / Self Care | Payer: PRIVATE HEALTH INSURANCE | Attending: Emergency Medicine

## 2020-11-13 ENCOUNTER — Emergency Department: Admit: 2020-11-13 | Payer: PRIVATE HEALTH INSURANCE | Primary: Adult Medicine

## 2020-11-13 DIAGNOSIS — D259 Leiomyoma of uterus, unspecified: Secondary | ICD-10-CM

## 2020-11-13 LAB — URINALYSIS WITH MICROSCOPIC
Bilirubin Urine: NEGATIVE MG/DL
Cast Type: NONE SEEN /HPF
Crystal Type: NEGATIVE /HPF
Epithelial Cells, UA: 15 /HPF
Glucose, Urine: NEGATIVE MG/DL
Leukocyte Esterase, Urine: NEGATIVE
Nitrite Urine, Quantitative: NEGATIVE
Protein, UA: NEGATIVE MG/DL
RBC, UA: >100 /HPF — ABNORMAL HIGH (ref 0–6)
Specific Gravity, UA: 1.02 (ref 1.001–1.035)
Urobilinogen, Urine: 0.2 MG/DL (ref 0.2–1.0)
WBC, UA: 10 /HPF — ABNORMAL HIGH (ref 0–5)
pH, Urine: 6 (ref 5.0–8.0)

## 2020-11-13 LAB — CBC WITH AUTO DIFFERENTIAL
Basophils %: 0.2 % (ref 0–1)
Basophils Absolute: 0 10*3/uL
Eosinophils %: 1.8 % (ref 0–3)
Eosinophils Absolute: 0.1 10*3/uL
Hematocrit: 37.6 % (ref 37–47)
Hemoglobin: 12.4 GM/DL — ABNORMAL LOW (ref 12.5–16.0)
Immature Neutrophil %: 0.2 % (ref 0–0.43)
Lymphocytes %: 21 % — ABNORMAL LOW (ref 24–44)
Lymphocytes Absolute: 1.2 10*3/uL
MCH: 26.8 PG — ABNORMAL LOW (ref 27–31)
MCHC: 33 % (ref 32.0–36.0)
MCV: 81.4 FL (ref 78–100)
MPV: 9.9 FL (ref 7.5–11.1)
Monocytes %: 7.3 % — ABNORMAL HIGH (ref 0–4)
Monocytes Absolute: 0.4 10*3/uL
Platelets: 228 10*3/uL (ref 140–440)
RBC: 4.62 10*6/uL (ref 4.2–5.4)
RDW: 14.6 % (ref 11.7–14.9)
Segs Absolute: 3.8 10*3/uL
Segs Relative: 69.5 % — ABNORMAL HIGH (ref 36–66)
Total Immature Neutrophil: 0.01 10*3/uL
WBC: 5.5 10*3/uL (ref 4.0–10.5)

## 2020-11-13 LAB — COMPREHENSIVE METABOLIC PANEL W/ REFLEX TO MG FOR LOW K
ALT: 13 U/L (ref 10–40)
AST: 17 IU/L (ref 15–37)
Albumin: 4 GM/DL (ref 3.4–5.0)
Alkaline Phosphatase: 54 IU/L (ref 40–129)
Anion Gap: 10 (ref 4–16)
BUN: 17 MG/DL (ref 6–23)
CO2: 22 MMOL/L (ref 21–32)
Calcium: 9 MG/DL (ref 8.3–10.6)
Chloride: 107 mMol/L (ref 99–110)
Creatinine: 0.9 MG/DL (ref 0.6–1.1)
GFR African American: >60 mL/min/{1.73_m2} (ref >60)
GFR Non-African American: >60 mL/min/{1.73_m2} (ref >60)
Glucose: 90 MG/DL (ref 70–99)
Potassium: 4.5 MMOL/L (ref 3.5–5.1)
Sodium: 139 MMOL/L (ref 135–145)
Total Bilirubin: 0.3 MG/DL (ref 0.0–1.0)
Total Protein: 6.7 GM/DL (ref 6.4–8.2)

## 2020-11-13 LAB — PREGNANCY, URINE
Pregnancy, Urine: NEGATIVE
Specific Gravity, Urine: 1.02 (ref 1.001–1.035)

## 2020-11-13 MED ORDER — HYDROCODONE-ACETAMINOPHEN 5-325 MG PO TABS
5-325 MG | ORAL_TABLET | Freq: Four times a day (QID) | ORAL | 0 refills | Status: AC | PRN
Start: 2020-11-13 — End: 2020-11-16

## 2020-11-13 MED ORDER — ONDANSETRON 4 MG PO TBDP
4 MG | ORAL_TABLET | Freq: Three times a day (TID) | ORAL | 0 refills | Status: AC | PRN
Start: 2020-11-13 — End: ?

## 2020-11-13 MED ORDER — IOPAMIDOL 76 % IV SOLN
76 % | Freq: Once | INTRAVENOUS | Status: AC | PRN
Start: 2020-11-13 — End: 2020-11-13
  Administered 2020-11-13: 18:00:00 via INTRAVENOUS

## 2020-11-13 MED ORDER — ONDANSETRON HCL 4 MG/2ML IJ SOLN
4 MG/2ML | Freq: Once | INTRAMUSCULAR | Status: AC
Start: 2020-11-13 — End: 2020-11-13
  Administered 2020-11-13: 17:00:00 via INTRAVENOUS

## 2020-11-13 MED ORDER — MORPHINE SULFATE (PF) 4 MG/ML IJ SOLN
4 MG/ML | Freq: Once | INTRAMUSCULAR | Status: AC
Start: 2020-11-13 — End: 2020-11-13
  Administered 2020-11-13: 17:00:00 via INTRAVENOUS

## 2020-11-13 MED ORDER — METRONIDAZOLE 500 MG PO TABS
500 MG | ORAL_TABLET | Freq: Three times a day (TID) | ORAL | 0 refills | Status: AC
Start: 2020-11-13 — End: 2020-11-20

## 2020-11-13 MED ORDER — CIPROFLOXACIN HCL 500 MG PO TABS
500 MG | ORAL_TABLET | Freq: Two times a day (BID) | ORAL | 0 refills | Status: AC
Start: 2020-11-13 — End: 2020-11-20

## 2020-11-13 MED FILL — MORPHINE SULFATE 4 MG/ML IJ SOLN: 4 mg/mL | INTRAMUSCULAR | Qty: 1

## 2020-11-13 MED FILL — ONDANSETRON HCL 4 MG/2ML IJ SOLN: 4 MG/2ML | INTRAMUSCULAR | Qty: 2

## 2020-11-13 MED FILL — ISOVUE-370 76 % IV SOLN: 76 % | INTRAVENOUS | Qty: 100

## 2020-11-13 NOTE — ED Triage Notes (Signed)
Patient ambulates to triage with complaints of Abdominal cramping. Patient states this has been an ongoing problem and she is scheduled to have a hysterectomy at the end of this month. Patient states pain is worse today and she threw up from the pain. Patient tried ibuprofen and flexeril.

## 2020-11-13 NOTE — ED Provider Notes (Signed)
EMERGENCY DEPARTMENT ENCOUNTER    Patient: Regina Potter  MRN: [id number]  DOB: 07/27/1976  Date of Evaluation: 11/13/2020  ED Provider:  Tyler Deis, MD    CHIEF COMPLAINT  Chief Complaint   Patient presents with   ??? Abdominal Cramping   ??? Emesis       HPI  Regina Potter is a 45 y.o. female who presents severe, constant sharp and crampy bilateral lower abdominal and pelvic pain for the last day.  Constant.  No known triggering or modifying factors.  Has had some associated nausea without emesis.  Currently on her period and is began with the cramping.  Reports a longstanding history of pelvic pain in particular with periods and is scheduled to have hysterectomy at the end of the month.  She states, however, that this is the worst pain that she has had with this.  Denies urinary symptoms.  Denies change in bowel meds.  Denies fever.    Denies any other associated symptoms or complaints or concerns.    REVIEW OF SYSTEMS    I have reviewed the nursing notes    Constitutional: negative for fever, chills, weight change, change in appetite  Neurological: negative for HA, lightheadedness, numbness, weakness  Ophthalmic: negative for vision change  ENT: negative for congestion, runny nose, sore throat  Cardiovascular: negative for chest pain, palpitations  Respiratory: negative for SOB, cough  GI: negative for vomiting, diarrhea, constipation, hematemesis, hematochezia, melena   GU: negative for dysuria, hematuria, vaginal discharge  Musculoskeletal: negative for myalgias, decreased ROM, joint swelling  Dermatological: negative for rash, pruritis  Endocrine: negative for temperature intolerance, diaphoresis  Hematological: negative for anemia, bleeding      PAST MEDICAL HISTORY  History reviewed. No pertinent past medical history.    CURRENT MEDICATIONS  @PTAMEDLIST @    ALLERGIES  Allergies   Allergen Reactions   ??? Pcn [Penicillins] Rash       SURGICAL HISTORY  Past Surgical History:   Procedure Laterality Date   ???  CHOLECYSTECTOMY     ??? INTRAUTERINE DEVICE INSERTION     ??? TUBAL LIGATION         FAMILY HISTORY  History reviewed. No pertinent family history.    SOCIAL HISTORY  Social History     Socioeconomic History   ??? Marital status: Divorced     Spouse name: None   ??? Number of children: None   ??? Years of education: None   ??? Highest education level: None   Occupational History   ??? None   Tobacco Use   ??? Smoking status: Never Smoker   ??? Smokeless tobacco: Never Used   Vaping Use   ??? Vaping Use: Never used   Substance and Sexual Activity   ??? Alcohol use: Not Currently     Comment: rarely   ??? Drug use: No   ??? Sexual activity: None   Other Topics Concern   ??? None   Social History Narrative   ??? None     Social Determinants of Health     Financial Resource Strain:    ??? Difficulty of Paying Living Expenses: Not on file   Food Insecurity:    ??? Worried About Charity fundraiser in the Last Year: Not on file   ??? Ran Out of Food in the Last Year: Not on file   Transportation Needs:    ??? Lack of Transportation (Medical): Not on file   ??? Lack of Transportation (Non-Medical): Not on  file   Physical Activity:    ??? Days of Exercise per Week: Not on file   ??? Minutes of Exercise per Session: Not on file   Stress:    ??? Feeling of Stress : Not on file   Social Connections:    ??? Frequency of Communication with Friends and Family: Not on file   ??? Frequency of Social Gatherings with Friends and Family: Not on file   ??? Attends Religious Services: Not on file   ??? Active Member of Clubs or Organizations: Not on file   ??? Attends Archivist Meetings: Not on file   ??? Marital Status: Not on file   Intimate Partner Violence:    ??? Fear of Current or Ex-Partner: Not on file   ??? Emotionally Abused: Not on file   ??? Physically Abused: Not on file   ??? Sexually Abused: Not on file   Housing Stability:    ??? Unable to Pay for Housing in the Last Year: Not on file   ??? Number of Places Lived in the Last Year: Not on file   ??? Unstable Housing in the Last  Year: Not on file           **Past medical, family and social histories reviewed and verified by me**      PHYSICAL EXAM  VITAL SIGNS:   ED Triage Vitals [11/13/20 1124]   Enc Vitals Group      BP (!) 145/90      Pulse 62      Resp 16      Temp 98 ??F (36.7 ??C)      Temp Source Infrared      SpO2 97 %      Weight 228 lb (103.4 kg)      Height 6' (1.829 m)      Head Circumference       Peak Flow       Pain Score       Pain Loc       Pain Edu?       Excl. in [place]?        Vitals during ED course were reviewed and are as charted.    Constitutional: Minimal distress, Non-toxic appearance    Eyes: Conjunctiva normal, No discharge    HENT: Normocephalic, Atraumatic, Bilateral external ears normal, posterior oropharynx is nonerythematous and without exudate, uvula is midline, oropharynx moist    Neck: Supple, no stridor, no grossly visible or palpable masses    Cardiovascular: Regular rate and rhythm, No murmurs, No rubs, No gallops    Pulmonary/Chest:  Normal breath sounds, No respiratory distress or accessory muscle use, No wheezing, crackles or rhonchi.     Abdomen: Suprapubic abdominal tenderness to palpation without peritoneal signs, otherwise abdomen is soft, nondistended and nonrigid, No tenderness or peritoneal signs elsewhere, No masses, normal bowel sounds    Back:  No midline point tenderness, No paraspinous muscle tenderness.  No CVA tenderness    Extremities:  No gross deformities, no edema, no tenderness    Neurologic:  Normal motor function, Normal sensory function, No focal deficits    Skin:  Warm, Dry, No erythema, No rash, No cyanosis, No mottling    Lymphatic:  No inguinal lymphadenopathy          RADIOLOGY/PROCEDURES/LABS/MEDICATIONS ADMINISTERED:    I have reviewed and interpreted all of the currently available lab results from this visit (if applicable):  Results for orders placed or performed during the hospital  encounter of 11/13/20   CBC with Auto Differential   Result Value Ref Range    WBC 5.5 4.0 -  10.5 K/CU MM    RBC 4.62 4.2 - 5.4 M/CU MM    Hemoglobin 12.4 (L) 12.5 - 16.0 GM/DL    Hematocrit 37.6 37 - 47 %    MCV 81.4 78 - 100 FL    MCH 26.8 (L) 27 - 31 PG    MCHC 33.0 32.0 - 36.0 %    RDW 14.6 11.7 - 14.9 %    Platelets 228 140 - 440 K/CU MM    MPV 9.9 7.5 - 11.1 FL    Differential Type AUTOMATED DIFFERENTIAL     Segs Relative 69.5 (H) 36 - 66 %    Lymphocytes % 21.0 (L) 24 - 44 %    Monocytes % 7.3 (H) 0 - 4 %    Eosinophils % 1.8 0 - 3 %    Basophils % 0.2 0 - 1 %    Segs Absolute 3.8 K/CU MM    Lymphocytes Absolute 1.2 K/CU MM    Monocytes Absolute 0.4 K/CU MM    Eosinophils Absolute 0.1 K/CU MM    Basophils Absolute 0.0 K/CU MM    Immature Neutrophil % 0.2 0 - 0.43 %    Total Immature Neutrophil 0.01 K/CU MM   Comprehensive Metabolic Panel w/ Reflex to MG   Result Value Ref Range    Sodium 139 135 - 145 MMOL/L    Potassium 4.5 3.5 - 5.1 MMOL/L    Chloride 107 99 - 110 mMol/L    CO2 22 21 - 32 MMOL/L    BUN 17 6 - 23 MG/DL    CREATININE 0.9 0.6 - 1.1 MG/DL    Glucose 90 70 - 99 MG/DL    Calcium 9.0 8.3 - 10.6 MG/DL    Albumin 4.0 3.4 - 5.0 GM/DL    Total Protein 6.7 6.4 - 8.2 GM/DL    Total Bilirubin 0.3 0.0 - 1.0 MG/DL    ALT 13 10 - 40 U/L    AST 17 15 - 37 IU/L    Alkaline Phosphatase 54 40 - 129 IU/L    GFR Non-African American >60 >60 mL/min/1.46m    GFR African American >60 >60 mL/min/1.731m   Anion Gap 10 4 - 16   Urinalysis with Microscopic   Result Value Ref Range    Color, UA PALE YELLOW (A) YELLOW    Clarity, UA SLIGHTLY CLOUDY (A) CLEAR    Glucose, Urine NEGATIVE NEGATIVE MG/DL    Bilirubin Urine NEGATIVE NEGATIVE MG/DL    Ketones, Urine TRACE (A) NEGATIVE MG/DL    Specific Gravity, UA 1.020 1.001 - 1.035    Blood, Urine LARGE (A) NEGATIVE    pH, Urine 6.0 5.0 - 8.0    Protein, UA NEGATIVE NEGATIVE MG/DL    Urobilinogen, Urine 0.2 0.2 - 1.0 MG/DL    Nitrite Urine, Quantitative NEGATIVE NEGATIVE    Leukocyte Esterase, Urine NEGATIVE NEGATIVE    RBC, UA >100 (H) 0 - 6 /HPF    WBC, UA 10 (H)  0 - 5 /HPF    Epithelial Cells, UA 15 /HPF    Cast Type NO CAST FORMS SEEN NO CAST FORMS SEEN /HPF    Bacteria, UA MODERATE (A) NEGATIVE /HPF    Crystal Type NEGATIVE NEGATIVE /HPF    Urinalysis Comments 1+ MUCAS OBSERVED    Pregnancy, Urine   Result Value Ref Range  Pregnancy, Urine NEGATIVE NEGATIVE    Specific Gravity, Urine 1.020 1.001 - 1.035    Interpretation HCG METHOD LIMITATIONS:           ABNORMAL LABS:  Labs Reviewed   CBC WITH AUTO DIFFERENTIAL - Abnormal; Notable for the following components:       Result Value    Hemoglobin 12.4 (*)     MCH 26.8 (*)     Segs Relative 69.5 (*)     Lymphocytes % 21.0 (*)     Monocytes % 7.3 (*)     All other components within normal limits   URINALYSIS WITH MICROSCOPIC - Abnormal; Notable for the following components:    Color, UA PALE YELLOW (*)     Clarity, UA SLIGHTLY CLOUDY (*)     Ketones, Urine TRACE (*)     Blood, Urine LARGE (*)     RBC, UA >100 (*)     WBC, UA 10 (*)     Bacteria, UA MODERATE (*)     All other components within normal limits   COMPREHENSIVE METABOLIC PANEL W/ REFLEX TO MG FOR LOW K   PREGNANCY, URINE         IMAGING STUDIES ORDERED:  CT ABDOMEN PELVIS W IV CONTRAST    I have personally viewed the imaging studies. The radiologist interpretation is:   CT ABDOMEN PELVIS W IV CONTRAST Additional Contrast? None   Final Result   Apparent mild circumferential wall thickening of the descending and sigmoid   colon, due to incomplete distention or infectious/inflammatory colitis.      Status post gastric sleeve surgery.  No evidence of bowel obstruction.      Ill-defined uterine fibroids.      IUD in place.      No adnexal mass.      Normal appendix.               MEDICATIONS ADMINISTERED:  Medications   morphine sulfate (PF) injection 4 mg (4 mg IntraVENous Given 11/13/20 1146)   ondansetron (ZOFRAN) injection 4 mg (4 mg IntraVENous Given 11/13/20 1146)   iopamidol (ISOVUE-370) 76 % injection 75 mL (75 mLs IntraVENous Given 11/13/20 1230)         COURSE &  MEDICAL DECISION MAKING  Last vitals: BP (!) 145/90    Pulse 62    Temp 98 ??F (36.7 ??C) (Infrared)    Resp 16    Ht 6' (1.829 m)    Wt 228 lb (103.4 kg)    SpO2 97%    BMI 30.92 kg/m??     45 year old female does have some chronic lower abdominal pelvic pain secondary to uterine fibroids and adenomyosis presents with worsening pain today.  There is some possible suggestion of colitis.      Differential diagnoses considered included, but were not limited to, acute appendicitis, acute intra-abdominal catastrophe, acute vascular emergency, bowel obstruction, perforated bowel, incarcerated or strangulated hernia, colitis, diverticulitis, ischemic bowel, cystitis, pyelonephritis, kidney stone, acute ovarian torsion/pathology, salpingitis/TOA/pelvic inflammatory disease and acute ectopic pregnancy.     Patient was given the above medications with improvement in symptoms.  On repeat examination the abdomen was non-surgical without peritoneal signs. The patient was able to tolerate oral intake. Patient was advised of the changing nature of intra-abdominal pathology and specific signs and symptoms to watch which may signal serious infectious, metabolic or surgical conditions for which immediate return to the ED would be necessary for further diagnosis and treatment.  Additional workup and treatment in the ED as documented above. Patient reassured and will be discharged home. I have explained to the patient in appropriate terminology our work-up in the ED and their diagnosis. I have also given anticipatory guidance and expectant management of their condition as an outpatient as per my custom. The patient was given clear discharge and follow-up instructions including return to the ER immediately for worsening concerns. The patient has been advised to follow-up with their primary care physician and/or referred physician in the next two to three days or sooner if worsening and to return to the ER immediately as above with  any concerns. I provided the patient counseling with regard to my customary list of strict return precautions as well as return precautions specific to the cause for today's emergency department visit. The patient will return under these provided conditions, but should also return for new concerns or further worsening. Pt and/or family understand and agree with plan.        Clinical Impression:  1. Pelvic pain    2. Uterine leiomyoma, unspecified location    3. Colitis        Disposition referral (if applicable):  Dellie Catholic, DO  [place] [address]  [phone]    Schedule an appointment as soon as possible for a visit       [facility]  [address]  [place] [address]  [phone]    If symptoms worsen      Disposition medications (if applicable):  Discharge Medication List as of 11/13/2020 12:57 PM      START taking these medications    Details   HYDROcodone-acetaminophen (NORCO) 5-325 MG per tablet Take 1 tablet by mouth every 6 hours as needed for Pain for up to 3 days. Intended supply: 3 days. Take lowest dose possible to manage pain, Disp-10 tablet, R-0Normal      ondansetron (ZOFRAN-ODT) 4 MG disintegrating tablet Take 1 tablet by mouth 3 times daily as needed for Nausea or Vomiting, Disp-21 tablet, R-0Normal      ciprofloxacin (CIPRO) 500 MG tablet Take 1 tablet by mouth 2 times daily for 7 days, Disp-14 tablet, R-0Normal      metroNIDAZOLE (FLAGYL) 500 MG tablet Take 1 tablet by mouth 3 times daily for 7 days, Disp-21 tablet, R-0Normal             ED Provider Disposition Time  DISPOSITION          Electronically signed by: Sharrell Ku, M.D., 11/13/2020  1:18 PM    This dictation was created with voice recognition software.  While attempts have been made to review the dictation as it is transcribed, on occasion the spoken word can be misinterpreted by the technology leading to omissions or inappropriate words, phrases or sentences.        Tyler Deis, MD  11/13/20 1320

## 2020-11-15 ENCOUNTER — Other Ambulatory Visit: Payer: Self-pay

## 2020-11-15 ENCOUNTER — Ambulatory Visit (INDEPENDENT_AMBULATORY_CARE_PROVIDER_SITE_OTHER): Payer: 59 | Admitting: Emergency Medicine

## 2020-11-15 ENCOUNTER — Encounter: Payer: Self-pay | Admitting: Emergency Medicine

## 2020-11-15 VITALS — BP 116/78 | HR 67 | Temp 98.7°F | Resp 16 | Ht 62.0 in | Wt 194.0 lb

## 2020-11-15 DIAGNOSIS — F4323 Adjustment disorder with mixed anxiety and depressed mood: Secondary | ICD-10-CM | POA: Insufficient documentation

## 2020-11-15 DIAGNOSIS — I10 Essential (primary) hypertension: Secondary | ICD-10-CM | POA: Diagnosis not present

## 2020-11-15 DIAGNOSIS — Z1211 Encounter for screening for malignant neoplasm of colon: Secondary | ICD-10-CM

## 2020-11-15 DIAGNOSIS — Z131 Encounter for screening for diabetes mellitus: Secondary | ICD-10-CM

## 2020-11-15 NOTE — Assessment & Plan Note (Signed)
Well-controlled hypertension.  Continue present medication.  No changes. Diet and nutrition discussed.  Importance of physical activity discussed. Follow-up in 6 months.  Earlier as needed.

## 2020-11-15 NOTE — Progress Notes (Signed)
Pamela Allen 45 y.o.   Chief Complaint  Patient presents with  . Hypertension    Follow up 6 month    HISTORY OF PRESENT ILLNESS: This is a 45 y.o. female with history of hypertension here for follow-up.  Taking Exforge 10-320 mg daily. Also has history of chronic anxiety.  Lexapro helping a great deal.  No side effects. Today has no complaints or medical concerns.  Generally doing well.  HPI   Prior to Admission medications   Medication Sig Start Date End Date Taking? Authorizing Provider  ALPRAZolam Duanne Moron) 0.5 MG tablet Take 1 tablet (0.5 mg total) by mouth 2 (two) times daily as needed for anxiety. 08/22/20  Yes Yana Schorr, Ines Bloomer, MD  amLODipine-valsartan (EXFORGE) 10-320 MG tablet Take 1 tablet by mouth daily. 03/20/20  Yes Abbygael Curtiss, Ines Bloomer, MD  cetirizine (ZYRTEC) 10 MG tablet Take 1 tablet (10 mg total) by mouth at bedtime. 03/23/19  Yes Cristalle Rohm, Ines Bloomer, MD  cyclobenzaprine (FLEXERIL) 5 MG tablet Take 1 tablet (5 mg total) by mouth 3 (three) times daily as needed for muscle spasms. 09/09/20  Yes Peri Jefferson, PA-C  diclofenac (VOLTAREN) 75 MG EC tablet Take 1 tablet (75 mg total) by mouth 2 (two) times daily. 09/09/20  Yes Peri Jefferson, PA-C  escitalopram (LEXAPRO) 10 MG tablet Take 1 tablet (10 mg total) by mouth daily. 08/22/20 11/20/20 Yes Ryleigh Esqueda, Ines Bloomer, MD  montelukast (SINGULAIR) 10 MG tablet Take 1 tablet (10 mg total) by mouth at bedtime. 03/23/19  Yes Cohen Boettner, Ines Bloomer, MD  Olopatadine HCl (PATADAY) 0.2 % SOLN Apply 1 drop to eye daily. 03/23/19  Yes Horald Pollen, MD    Allergies  Allergen Reactions  . 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 Iron deficiency 03/02/2018  . Class 1 obesity due to excess calories with serious comorbidity and body mass index (BMI) of 34.0 to 34.9 in adult 03/02/2018  . Seasonal allergies 01/02/2018  . 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 Vitamin  D deficiency 11/11/2014  . HTN (hypertension) 11/21/2011    Past Medical History:  Diagnosis Date  . Allergy   . Hypertension   . Mass    left buttock    Past Surgical History:  Procedure Laterality Date  . BREAST EXCISIONAL BIOPSY     right US guided  . MASS EXCISION Left 01/17/2016   Procedure: EXCISION BIOPSY OF MASS LEFT BUTTOCK ;  Surgeon: Leighton Ruff, MD;  Location: [facility];  Service: General;  Laterality: Left;  . TUBAL LIGATION Bilateral 10-30-2003   w/ Bilateral Salpingectomy    Social History   Socioeconomic History  . Marital status: Married    Spouse name: Not on file  . Number of children: Not on file  . Years of education: Not on file  . Highest education level: Not on file  Occupational History  . Not on file  Tobacco Use  . Smoking status: Never Smoker  . Smokeless tobacco: Never Used  Substance and Sexual Activity  . Alcohol use: Yes    Comment: occasional  . Drug use: No  . Sexual activity: Not on file  Other Topics Concern  . Not on file  Social History Narrative   Married   Cytogeneticist / CNA   Social Determinants of Health   Financial Resource Strain: Not on file  Food Insecurity: Not on file  Transportation Needs: Not on file  Physical Activity: Not on file  Stress: Not on file  Social Connections: Not on file  Intimate Partner Violence: Not on file    Family History  Problem Relation Age of Onset  . Diabetes Mother   . Heart disease Mother   . Hypertension Mother   . Stroke Mother   . Cancer Father   . Hypertension Father   . Stroke Sister      Review of Systems  Constitutional: Negative.  Negative for chills and fever.  HENT: Negative.  Negative for congestion and sore throat.   Respiratory: Negative.  Negative for cough and shortness of breath.   Cardiovascular: Negative.  Negative for chest pain and palpitations.  Gastrointestinal: Negative.  Negative for abdominal pain, blood in  stool, diarrhea, melena, nausea and vomiting.  Genitourinary: Negative.  Negative for dysuria and hematuria.  Musculoskeletal: Negative.  Negative for back pain, myalgias and neck pain.  Skin: Negative.  Negative for rash.  Neurological: Negative.  Negative for dizziness and headaches.  All other systems reviewed and are negative.   Today's Vitals   11/15/20 1031  BP: 116/78  Pulse: 67  Resp: 16  Temp: 98.7 F (37.1 C)  TempSrc: Temporal  SpO2: 98%  Weight: 194 lb (88 kg)  Height: 5' 2" (1.575 m)   Body mass index is 35.48 kg/m. Wt Readings from Last 3 Encounters:  11/15/20 194 lb (88 kg)  08/22/20 195 lb (88.5 kg)  03/20/20 194 lb (88 kg)    Physical Exam Vitals reviewed.  Constitutional:      Appearance: Normal appearance.  HENT:     Head: Normocephalic.  Eyes:     Extraocular Movements: Extraocular movements intact.     Conjunctiva/sclera: Conjunctivae normal.     Pupils: Pupils are equal, round, and reactive to light.  Cardiovascular:     Rate and Rhythm: Normal rate and regular rhythm.     Pulses: Normal pulses.     Heart sounds: Normal heart sounds.  Pulmonary:     Effort: Pulmonary effort is normal.     Breath sounds: Normal breath sounds.  Musculoskeletal:        General: Normal range of motion.     Cervical back: Normal range of motion and neck supple.  Skin:    General: Skin is warm and dry.     Capillary Refill: Capillary refill takes less than 2 seconds.  Neurological:     General: No focal deficit present.     Mental Status: She is alert and oriented to person, place, and time.  Psychiatric:        Mood and Affect: Mood normal.        Behavior: Behavior normal.      ASSESSMENT & PLAN: HTN (hypertension) Well-controlled hypertension.  Continue present medication.  No changes. Diet and nutrition discussed.  Importance of physical activity discussed. Follow-up in 6 months.  Earlier as needed.  Situational mixed anxiety and depressive  disorder Stable and well-controlled.  Continue Lexapro 10 mg daily. Alprazolam only as needed.   [name] was seen today for hypertension.  Diagnoses and all orders for this visit:  Essential hypertension -     CMP14+EGFR -     Lipid panel  Situational mixed anxiety and depressive disorder  Screening for colon cancer -     Ambulatory referral to Gastroenterology  Screening for diabetes mellitus -     CMP14+EGFR -     Hemoglobin A1c  Primary hypertension  Patient [facility] contact number to schedule appointment [phone]. Office location is [place].   If you have lab work done today you will be contacted with your lab results within the next 2 weeks.  If you have not heard from [place] then please contact us. The fastest way to get your results is to register for My Chart.   IF you received an x-ray today, you will receive an invoice from [facility]. Please contact [facility] at [phone] with questions or concerns regarding your invoice.   IF you received labwork today, you will receive an invoice from [facility]. Please contact [facility] at [phone] with questions or concerns regarding your invoice.   Our billing staff will not be able to assist you with questions regarding bills from these companies.  You will be contacted with the lab results as soon as they are available. The fastest way to get your results is to activate your My Chart account. Instructions are located on the last page of this paperwork. If you have not heard from [place] regarding the results in 2 weeks, please contact this office.     Hypertension, Adult High blood pressure (hypertension) is when the force of blood pumping through the arteries is too strong. The arteries are the blood vessels that carry blood from the heart throughout the body. Hypertension forces the heart to work harder to pump blood and may cause arteries to  become narrow or stiff. Untreated or uncontrolled hypertension can cause a heart attack, heart failure, a stroke, kidney disease, and other problems. A blood pressure reading consists of a higher number over a lower number. Ideally, your blood pressure should be below 120/80. The first ("top") number is called the systolic pressure. It is a measure of the pressure in your arteries as your heart beats. The second ("bottom") number is called the diastolic pressure. It is a measure of the pressure in your arteries as the heart relaxes. What are the causes? The exact cause of this condition is not known. There are some conditions that result in or are related to high blood pressure. What increases the risk? Some risk factors for high blood pressure are under your control. The following factors may make you more likely to develop this condition:  Smoking.  Having type 2 diabetes mellitus, high cholesterol, or both.  Not getting enough exercise or physical activity.  Being overweight.  Having too much fat, sugar, calories, or salt (sodium) in your diet.  Drinking too much alcohol. Some risk factors for high blood pressure may be difficult or impossible to change. Some of these factors include:  Having chronic kidney disease.  Having a family history of high blood pressure.  Age. Risk increases with age.  Race. You may be at higher risk if you are African American.  Gender. Men are at higher risk than women before age 53. After age 51, women are at higher risk than men.  Having obstructive sleep apnea.  Stress. What are the signs or symptoms? High blood pressure may not cause symptoms. Very high blood pressure (hypertensive crisis) may cause:  Headache.  Anxiety.  Shortness of breath.  Nosebleed.  Nausea and vomiting.  Vision changes.  Severe chest pain.  Seizures. How is this diagnosed? This condition is diagnosed by measuring your blood pressure while you are seated,  with your arm resting on a flat surface, your legs uncrossed, and your feet flat on the floor. The cuff of  the blood pressure monitor will be placed directly against the skin of your upper arm at the level of your heart. It should be measured at least twice using the same arm. Certain conditions can cause a difference in blood pressure between your right and left arms. Certain factors can cause blood pressure readings to be lower or higher than normal for a short period of time:  When your blood pressure is higher when you are in a health care provider's office than when you are at home, this is called white coat hypertension. Most people with this condition do not need medicines.  When your blood pressure is higher at home than when you are in a health care provider's office, this is called masked hypertension. Most people with this condition may need medicines to control blood pressure. If you have a high blood pressure reading during one visit or you have normal blood pressure with other risk factors, you may be asked to:  Return on a different day to have your blood pressure checked again.  Monitor your blood pressure at home for 1 week or longer. If you are diagnosed with hypertension, you may have other blood or imaging tests to help your health care provider understand your overall risk for other conditions. How is this treated? This condition is treated by making healthy lifestyle changes, such as eating healthy foods, exercising more, and reducing your alcohol intake. Your health care provider may prescribe medicine if lifestyle changes are not enough to get your blood pressure under control, and if:  Your systolic blood pressure is above 130.  Your diastolic blood pressure is above 80. Your personal target blood pressure may vary depending on your medical conditions, your age, and other factors. Follow these instructions at home: Eating and drinking  Eat a diet that is high in fiber  and potassium, and low in sodium, added sugar, and fat. An example eating plan is called the DASH (Dietary Approaches to Stop Hypertension) diet. To eat this way: ? Eat plenty of fresh fruits and vegetables. Try to fill one half of your plate at each meal with fruits and vegetables. ? Eat whole grains, such as whole-wheat pasta, brown rice, or whole-grain bread. Fill about one fourth of your plate with whole grains. ? Eat or drink low-fat dairy products, such as skim milk or low-fat yogurt. ? Avoid fatty cuts of meat, processed or cured meats, and poultry with skin. Fill about one fourth of your plate with lean proteins, such as fish, chicken without skin, beans, eggs, or tofu. ? Avoid pre-made and processed foods. These tend to be higher in sodium, added sugar, and fat.  Reduce your daily sodium intake. Most people with hypertension should eat less than 1,500 mg of sodium a day.  Do not drink alcohol if: ? Your health care provider tells you not to drink. ? You are pregnant, may be pregnant, or are planning to become pregnant.  If you drink alcohol: ? Limit how much you use to:  0-1 drink a day for women.  0-2 drinks a day for men. ? Be aware of how much alcohol is in your drink. In the [place] one drink equals one 12 oz bottle of beer (355 mL), one 5 oz glass of wine (148 mL), or one 1 oz glass of hard liquor (44 mL).   Lifestyle  Work with your health care provider to maintain a healthy body weight or to lose weight. Ask what an ideal weight is for  you.  Get at least 30 minutes of exercise most days of the week. Activities may include walking, swimming, or biking.  Include exercise to strengthen your muscles (resistance exercise), such as Pilates or lifting weights, as part of your weekly exercise routine. Try to do these types of exercises for 30 minutes at least 3 days a week.  Do not use any products that contain nicotine or tobacco, such as cigarettes, e-cigarettes, and chewing  tobacco. If you need help quitting, ask your health care provider.  Monitor your blood pressure at home as told by your health care provider.  Keep all follow-up visits as told by your health care provider. This is important.   Medicines  Take over-the-counter and prescription medicines only as told by your health care provider. Follow directions carefully. Blood pressure medicines must be taken as prescribed.  Do not skip doses of blood pressure medicine. Doing this puts you at risk for problems and can make the medicine less effective.  Ask your health care provider about side effects or reactions to medicines that you should watch for. Contact a health care provider if you:  Think you are having a reaction to a medicine you are taking.  Have headaches that keep coming back (recurring).  Feel dizzy.  Have swelling in your ankles.  Have trouble with your vision. Get help right away if you:  Develop a severe headache or confusion.  Have unusual weakness or numbness.  Feel faint.  Have severe pain in your chest or abdomen.  Vomit repeatedly.  Have trouble breathing. Summary  Hypertension is when the force of blood pumping through your arteries is too strong. If this condition is not controlled, it may put you at risk for serious complications.  Your personal target blood pressure may vary depending on your medical conditions, your age, and other factors. For most people, a normal blood pressure is less than 120/80.  Hypertension is treated with lifestyle changes, medicines, or a combination of both. Lifestyle changes include losing weight, eating a healthy, low-sodium diet, exercising more, and limiting alcohol. This information is not intended to replace advice given to you by your health care provider. Make sure you discuss any questions you have with your health care provider. Document Revised: 05/12/2018 Document Reviewed: 05/12/2018 Elsevier Patient Education  2021  [employer]      Agustina Caroli, MD [facility]

## 2020-11-15 NOTE — Assessment & Plan Note (Signed)
Stable and well-controlled.  Continue Lexapro 10 mg daily. Alprazolam only as needed.

## 2020-11-15 NOTE — Patient Instructions (Addendum)
[facility] contact number to schedule appointment [phone]. Office location is [place].   If you have lab work done today you will be contacted with your lab results within the next 2 weeks.  If you have not heard from [place] then please contact us. The fastest way to get your results is to register for My Chart.   IF you received an x-ray today, you will receive an invoice from [facility]. Please contact [facility] at [phone] with questions or concerns regarding your invoice.   IF you received labwork today, you will receive an invoice from [facility]. Please contact [facility] at [phone] with questions or concerns regarding your invoice.   Our billing staff will not be able to assist you with questions regarding bills from these companies.  You will be contacted with the lab results as soon as they are available. The fastest way to get your results is to activate your My Chart account. Instructions are located on the last page of this paperwork. If you have not heard from [place] regarding the results in 2 weeks, please contact this office.     Hypertension, Adult High blood pressure (hypertension) is when the force of blood pumping through the arteries is too strong. The arteries are the blood vessels that carry blood from the heart throughout the body. Hypertension forces the heart to work harder to pump blood and may cause arteries to become narrow or stiff. Untreated or uncontrolled hypertension can cause a heart attack, heart failure, a stroke, kidney disease, and other problems. A blood pressure reading consists of a higher number over a lower number. Ideally, your blood pressure should be below 120/80. The first ("top") number is called the systolic pressure. It is a measure of the pressure in your arteries as your heart beats. The second ("bottom") number is called the diastolic pressure. It is a measure of the pressure in your  arteries as the heart relaxes. What are the causes? The exact cause of this condition is not known. There are some conditions that result in or are related to high blood pressure. What increases the risk? Some risk factors for high blood pressure are under your control. The following factors may make you more likely to develop this condition:  Smoking.  Having type 2 diabetes mellitus, high cholesterol, or both.  Not getting enough exercise or physical activity.  Being overweight.  Having too much fat, sugar, calories, or salt (sodium) in your diet.  Drinking too much alcohol. Some risk factors for high blood pressure may be difficult or impossible to change. Some of these factors include:  Having chronic kidney disease.  Having a family history of high blood pressure.  Age. Risk increases with age.  Race. You may be at higher risk if you are African American.  Gender. Men are at higher risk than women before age 48. After age 41, women are at higher risk than men.  Having obstructive sleep apnea.  Stress. What are the signs or symptoms? High blood pressure may not cause symptoms. Very high blood pressure (hypertensive crisis) may cause:  Headache.  Anxiety.  Shortness of breath.  Nosebleed.  Nausea and vomiting.  Vision changes.  Severe chest pain.  Seizures. How is this diagnosed? This condition is diagnosed by measuring your blood pressure while you are seated, with your arm resting on a flat surface, your legs uncrossed, and your feet flat on the floor. The cuff of the blood pressure monitor will  be placed directly against the skin of your upper arm at the level of your heart. It should be measured at least twice using the same arm. Certain conditions can cause a difference in blood pressure between your right and left arms. Certain factors can cause blood pressure readings to be lower or higher than normal for a short period of time:  When your blood  pressure is higher when you are in a health care provider's office than when you are at home, this is called white coat hypertension. Most people with this condition do not need medicines.  When your blood pressure is higher at home than when you are in a health care provider's office, this is called masked hypertension. Most people with this condition may need medicines to control blood pressure. If you have a high blood pressure reading during one visit or you have normal blood pressure with other risk factors, you may be asked to:  Return on a different day to have your blood pressure checked again.  Monitor your blood pressure at home for 1 week or longer. If you are diagnosed with hypertension, you may have other blood or imaging tests to help your health care provider understand your overall risk for other conditions. How is this treated? This condition is treated by making healthy lifestyle changes, such as eating healthy foods, exercising more, and reducing your alcohol intake. Your health care provider may prescribe medicine if lifestyle changes are not enough to get your blood pressure under control, and if:  Your systolic blood pressure is above 130.  Your diastolic blood pressure is above 80. Your personal target blood pressure may vary depending on your medical conditions, your age, and other factors. Follow these instructions at home: Eating and drinking  Eat a diet that is high in fiber and potassium, and low in sodium, added sugar, and fat. An example eating plan is called the DASH (Dietary Approaches to Stop Hypertension) diet. To eat this way: ? Eat plenty of fresh fruits and vegetables. Try to fill one half of your plate at each meal with fruits and vegetables. ? Eat whole grains, such as whole-wheat pasta, brown rice, or whole-grain bread. Fill about one fourth of your plate with whole grains. ? Eat or drink low-fat dairy products, such as skim milk or low-fat  yogurt. ? Avoid fatty cuts of meat, processed or cured meats, and poultry with skin. Fill about one fourth of your plate with lean proteins, such as fish, chicken without skin, beans, eggs, or tofu. ? Avoid pre-made and processed foods. These tend to be higher in sodium, added sugar, and fat.  Reduce your daily sodium intake. Most people with hypertension should eat less than 1,500 mg of sodium a day.  Do not drink alcohol if: ? Your health care provider tells you not to drink. ? You are pregnant, may be pregnant, or are planning to become pregnant.  If you drink alcohol: ? Limit how much you use to:  0-1 drink a day for women.  0-2 drinks a day for men. ? Be aware of how much alcohol is in your drink. In the [place] one drink equals one 12 oz bottle of beer (355 mL), one 5 oz glass of wine (148 mL), or one 1 oz glass of hard liquor (44 mL).   Lifestyle  Work with your health care provider to maintain a healthy body weight or to lose weight. Ask what an ideal weight is for you.  Get at  least 30 minutes of exercise most days of the week. Activities may include walking, swimming, or biking.  Include exercise to strengthen your muscles (resistance exercise), such as Pilates or lifting weights, as part of your weekly exercise routine. Try to do these types of exercises for 30 minutes at least 3 days a week.  Do not use any products that contain nicotine or tobacco, such as cigarettes, e-cigarettes, and chewing tobacco. If you need help quitting, ask your health care provider.  Monitor your blood pressure at home as told by your health care provider.  Keep all follow-up visits as told by your health care provider. This is important.   Medicines  Take over-the-counter and prescription medicines only as told by your health care provider. Follow directions carefully. Blood pressure medicines must be taken as prescribed.  Do not skip doses of blood pressure medicine. Doing this puts you at  risk for problems and can make the medicine less effective.  Ask your health care provider about side effects or reactions to medicines that you should watch for. Contact a health care provider if you:  Think you are having a reaction to a medicine you are taking.  Have headaches that keep coming back (recurring).  Feel dizzy.  Have swelling in your ankles.  Have trouble with your vision. Get help right away if you:  Develop a severe headache or confusion.  Have unusual weakness or numbness.  Feel faint.  Have severe pain in your chest or abdomen.  Vomit repeatedly.  Have trouble breathing. Summary  Hypertension is when the force of blood pumping through your arteries is too strong. If this condition is not controlled, it may put you at risk for serious complications.  Your personal target blood pressure may vary depending on your medical conditions, your age, and other factors. For most people, a normal blood pressure is less than 120/80.  Hypertension is treated with lifestyle changes, medicines, or a combination of both. Lifestyle changes include losing weight, eating a healthy, low-sodium diet, exercising more, and limiting alcohol. This information is not intended to replace advice given to you by your health care provider. Make sure you discuss any questions you have with your health care provider. Document Revised: 05/12/2018 Document Reviewed: 05/12/2018 Elsevier Patient Education  2021 [employer].

## 2020-11-16 LAB — CMP14+EGFR
ALT: 16 IU/L (ref 0–32)
AST: 19 IU/L (ref 0–40)
Albumin/Globulin Ratio: 1.6 (ref 1.2–2.2)
Albumin: 4.5 g/dL (ref 3.8–4.8)
Alkaline Phosphatase: 52 IU/L (ref 44–121)
BUN/Creatinine Ratio: 8 — ABNORMAL LOW (ref 9–23)
BUN: 8 mg/dL (ref 6–24)
Bilirubin Total: 0.2 mg/dL (ref 0.0–1.2)
CO2: 21 mmol/L (ref 20–29)
Calcium: 8.9 mg/dL (ref 8.7–10.2)
Chloride: 106 mmol/L (ref 96–106)
Creatinine, Ser: 1 mg/dL (ref 0.57–1.00)
Globulin, Total: 2.9 g/dL (ref 1.5–4.5)
Glucose: 100 mg/dL — ABNORMAL HIGH (ref 65–99)
Potassium: 4.6 mmol/L (ref 3.5–5.2)
Sodium: 140 mmol/L (ref 134–144)
Total Protein: 7.4 g/dL (ref 6.0–8.5)
eGFR: 70 mL/min/{1.73_m2} (ref >59)

## 2020-11-16 LAB — LIPID PANEL
Chol/HDL Ratio: 3.1 ratio (ref 0.0–4.4)
Cholesterol, Total: 158 mg/dL (ref 100–199)
HDL: 51 mg/dL (ref >39)
LDL Chol Calc (NIH): 95 mg/dL (ref 0–99)
Triglycerides: 58 mg/dL (ref 0–149)
VLDL Cholesterol Cal: 12 mg/dL (ref 5–40)

## 2020-11-16 LAB — HEMOGLOBIN A1C
Est. average glucose Bld gHb Est-mCnc: 103 mg/dL
Hgb A1c MFr Bld: 5.2 % (ref 4.8–5.6)

## 2020-11-19 ENCOUNTER — Telehealth: Payer: Self-pay | Admitting: Dermatology

## 2020-11-19 NOTE — Telephone Encounter (Signed)
Referral from Dr Mitchel Honour. Scheduled 03/11/21 @10 :30 w/ST

## 2020-11-20 ENCOUNTER — Encounter: Payer: Self-pay | Admitting: Gastroenterology

## 2020-11-26 NOTE — Telephone Encounter (Signed)
Referral opened back up. Notes documented and referral was attached to appointment.

## 2021-01-18 ENCOUNTER — Encounter: Payer: Self-pay | Admitting: Emergency Medicine

## 2021-01-25 NOTE — Telephone Encounter (Signed)
Please Advise

## 2021-01-29 ENCOUNTER — Other Ambulatory Visit: Payer: Self-pay

## 2021-01-29 DIAGNOSIS — J302 Other seasonal allergic rhinitis: Secondary | ICD-10-CM

## 2021-01-29 MED ORDER — MONTELUKAST SODIUM 10 MG PO TABS: 10.0000 mg | ORAL_TABLET | Freq: Every day | ORAL | 3 refills | Status: DC

## 2021-01-29 MED ORDER — CETIRIZINE HCL 10 MG PO TABS: 10.0000 mg | ORAL_TABLET | Freq: Every day | ORAL | 3 refills | Status: DC

## 2021-01-29 NOTE — Telephone Encounter (Signed)
Refilled montelukast and cetirizine.

## 2021-01-30 ENCOUNTER — Ambulatory Visit (AMBULATORY_SURGERY_CENTER): Payer: Self-pay | Admitting: *Deleted

## 2021-01-30 ENCOUNTER — Other Ambulatory Visit: Payer: Self-pay

## 2021-01-30 VITALS — Ht 63.0 in | Wt 196.6 lb

## 2021-01-30 DIAGNOSIS — Z1211 Encounter for screening for malignant neoplasm of colon: Secondary | ICD-10-CM

## 2021-01-30 NOTE — Progress Notes (Signed)
No egg or soy allergy known to patient  No issues with past sedation with any surgeries or procedures Patient denies ever being told they had issues or difficulty with intubation  No FH of Malignant Hyperthermia No diet pills per patient No home 02 use per patient  No blood thinners per patient  Pt denies issues with constipation  No A fib or A flutter  EMMI video to pt or via [place] 19 guidelines implemented in [place] today with Pt and RN  Pt is fully vaccinated  for Covid   Due to the COVID-19 pandemic we are asking patients to follow certain guidelines.  Pt aware of COVID protocols and [facility] guidelines

## 2021-02-13 ENCOUNTER — Ambulatory Visit (AMBULATORY_SURGERY_CENTER): Payer: 59 | Admitting: Gastroenterology

## 2021-02-13 ENCOUNTER — Other Ambulatory Visit: Payer: Self-pay

## 2021-02-13 ENCOUNTER — Encounter: Payer: Self-pay | Admitting: Gastroenterology

## 2021-02-13 VITALS — BP 129/82 | HR 61 | Temp 98.6°F | Resp 15 | Ht 62.0 in | Wt 196.0 lb

## 2021-02-13 DIAGNOSIS — D123 Benign neoplasm of transverse colon: Secondary | ICD-10-CM

## 2021-02-13 DIAGNOSIS — K635 Polyp of colon: Secondary | ICD-10-CM | POA: Diagnosis not present

## 2021-02-13 DIAGNOSIS — Z1211 Encounter for screening for malignant neoplasm of colon: Secondary | ICD-10-CM

## 2021-02-13 MED ORDER — SODIUM CHLORIDE 0.9 % IV SOLN: 500.0000 mL | INTRAVENOUS | Status: DC

## 2021-02-13 NOTE — Op Note (Signed)
[place] Patient Name: Pamela Allen Procedure Date: 02/13/2021 10:43 AM MRN: [id number] Endoscopist: Jackquline Denmark , MD Age: 45 Referring MD:  Date of Birth: December 02, 1975 Gender: Female Account #: [id number] Procedure:                Colonoscopy Indications:              Screening for colorectal malignant neoplasm Medicines:                Monitored Anesthesia Care Procedure:                Pre-Anesthesia Assessment:                           - Prior to the procedure, a History and Physical                            was performed, and patient medications and                            allergies were reviewed. The patient's tolerance of                            previous anesthesia was also reviewed. The risks                            and benefits of the procedure and the sedation                            options and risks were discussed with the patient.                            All questions were answered, and informed consent                            was obtained. Prior Anticoagulants: The patient has                            taken no previous anticoagulant or antiplatelet                            agents. ASA Grade Assessment: II - A patient with                            mild systemic disease. After reviewing the risks                            and benefits, the patient was deemed in                            satisfactory condition to undergo the procedure.                           After obtaining informed consent, the colonoscope  was passed under direct vision. Throughout the                            procedure, the patient's blood pressure, pulse, and                            oxygen saturations were monitored continuously. The                            Olympus PCF-H190DL (#[id number]) Colonoscope was                            introduced through the anus and advanced to the 2                            cm into the ileum. The  colonoscopy was performed                            without difficulty. The patient tolerated the                            procedure well. The quality of the bowel                            preparation was good. The terminal ileum, ileocecal                            valve, appendiceal orifice, and rectum were                            photographed. Scope In: 10:59:54 AM Scope Out: 11:11:04 AM Scope Withdrawal Time: 0 hours 6 minutes 56 seconds  Total Procedure Duration: 0 hours 11 minutes 10 seconds  Findings:                 A 4 mm polyp was found in the mid transverse colon.                            The polyp was sessile. The polyp was removed with a                            cold biopsy forceps. Resection and retrieval were                            complete.                           Non-bleeding internal hemorrhoids were found during                            retroflexion. The hemorrhoids were small.                           The terminal ileum appeared normal.  The exam was otherwise without abnormality on                            direct and retroflexion views. The colon was mildly                            redundant. Complications:            No immediate complications. Estimated Blood Loss:     Estimated blood loss: none. Impression:               - One 4 mm polyp in the mid transverse colon,                            removed with a cold biopsy forceps. Resected and                            retrieved.                           - Non-bleeding internal hemorrhoids.                           - The examined portion of the ileum was normal.                           - The examination was otherwise normal on direct                            and retroflexion views. Recommendation:           - Patient has a contact number available for                            emergencies. The signs and symptoms of potential                             delayed complications were discussed with the                            patient. Return to normal activities tomorrow.                            Written discharge instructions were provided to the                            patient.                           - High fiber diet.                           - Continue present medications.                           - Await pathology results.                           -  Repeat colonoscopy for surveillance based on                            pathology results.                           - The findings and recommendations were discussed                            with the patient's family. Jackquline Denmark, MD 02/13/2021 11:17:20 AM This report has been signed electronically.

## 2021-02-13 NOTE — Progress Notes (Signed)
Pt's states no medical or surgical changes since previsit or office visit. 

## 2021-02-13 NOTE — Patient Instructions (Signed)
Information on hemorrhoids and polyps given to you today.  Resume previous diet and medications.  Await pathology results.  YOU HAD AN ENDOSCOPIC PROCEDURE TODAY AT [facility]:   Refer to the procedure report that was given to you for any specific questions about what was found during the examination.  If the procedure report does not answer your questions, please call your gastroenterologist to clarify.  If you requested that your care partner not be given the details of your procedure findings, then the procedure report has been included in a sealed envelope for you to review at your convenience later.  YOU SHOULD EXPECT: Some feelings of bloating in the abdomen. Passage of more gas than usual.  Walking can help get rid of the air that was put into your GI tract during the procedure and reduce the bloating. If you had a lower endoscopy (such as a colonoscopy or flexible sigmoidoscopy) you may notice spotting of blood in your stool or on the toilet paper. If you underwent a bowel prep for your procedure, you may not have a normal bowel movement for a few days.  Please Note:  You might notice some irritation and congestion in your nose or some drainage.  This is from the oxygen used during your procedure.  There is no need for concern and it should clear up in a day or so.  SYMPTOMS TO REPORT IMMEDIATELY:   Following lower endoscopy (colonoscopy or flexible sigmoidoscopy):  Excessive amounts of blood in the stool  Significant tenderness or worsening of abdominal pains  Swelling of the abdomen that is new, acute  Fever of 100F or higher  For urgent or emergent issues, a gastroenterologist can be reached at any hour by calling [phone]. Do not use MyChart messaging for urgent concerns.    DIET:  We do recommend a small meal at first, but then you may proceed to your regular diet.  Drink plenty of fluids but you should avoid alcoholic beverages for 24  hours.  ACTIVITY:  You should plan to take it easy for the rest of today and you should NOT DRIVE or use heavy machinery until tomorrow (because of the sedation medicines used during the test).    FOLLOW UP: Our staff will call the number listed on your records 48-72 hours following your procedure to check on you and address any questions or concerns that you may have regarding the information given to you following your procedure. If we do not reach you, we will leave a message.  We will attempt to reach you two times.  During this call, we will ask if you have developed any symptoms of COVID 19. If you develop any symptoms (ie: fever, flu-like symptoms, shortness of breath, cough etc.) before then, please call [phone].  If you test positive for Covid 19 in the 2 weeks post procedure, please call and report this information to [place].    If any biopsies were taken you will be contacted by phone or by letter within the next 1-3 weeks.  Please call us at [phone] if you have not heard about the biopsies in 3 weeks.    SIGNATURES/CONFIDENTIALITY: You and/or your care partner have signed paperwork which will be entered into your electronic medical record.  These signatures attest to the fact that that the information above on your After Visit Summary has been reviewed and is understood.  Full responsibility of the confidentiality of this discharge information lies with you and/or your  care-partner. 

## 2021-02-13 NOTE — Progress Notes (Signed)
Report to PACU, RN, vss, BBS= Clear.  

## 2021-02-13 NOTE — Progress Notes (Signed)
Called to room to assist during endoscopic procedure.  Patient ID and intended procedure confirmed with present staff. Received instructions for my participation in the procedure from the performing physician.  

## 2021-02-15 ENCOUNTER — Telehealth: Payer: Self-pay | Admitting: *Deleted

## 2021-02-15 NOTE — Telephone Encounter (Signed)
  Follow up Call-  Call back number 02/13/2021  Post procedure Call Back phone  # [phone]  Permission to leave phone message Yes  Some recent data might be hidden     Patient questions:  Do you have a fever, pain , or abdominal swelling? No. Pain Score  0 *  Have you tolerated food without any problems? Yes.    Have you been able to return to your normal activities? Yes.    Do you have any questions about your discharge instructions: Diet   No. Medications  No. Follow up visit  No.  Do you have questions or concerns about your Care? No.  Actions: * If pain score is 4 or above: No action needed, pain <4.  1. Have you developed a fever since your procedure? no  2.   Have you had an respiratory symptoms (SOB or cough) since your procedure? no  3.   Have you tested positive for COVID 19 since your procedure no  4.   Have you had any family members/close contacts diagnosed with the COVID 19 since your procedure? no   If yes to any of these questions please route to Joylene John, RN and Joella Prince, RN

## 2021-02-20 ENCOUNTER — Other Ambulatory Visit: Payer: Self-pay | Admitting: Emergency Medicine

## 2021-02-20 DIAGNOSIS — F4323 Adjustment disorder with mixed anxiety and depressed mood: Secondary | ICD-10-CM

## 2021-02-28 ENCOUNTER — Encounter: Payer: Self-pay | Admitting: Gastroenterology

## 2021-03-11 ENCOUNTER — Encounter: Payer: Self-pay | Admitting: Dermatology

## 2021-03-11 ENCOUNTER — Ambulatory Visit (INDEPENDENT_AMBULATORY_CARE_PROVIDER_SITE_OTHER): Payer: 59 | Admitting: Dermatology

## 2021-03-11 ENCOUNTER — Other Ambulatory Visit: Payer: Self-pay

## 2021-03-11 ENCOUNTER — Other Ambulatory Visit: Payer: Self-pay | Admitting: Dermatology

## 2021-03-11 DIAGNOSIS — L820 Inflamed seborrheic keratosis: Secondary | ICD-10-CM

## 2021-03-11 DIAGNOSIS — L821 Other seborrheic keratosis: Secondary | ICD-10-CM

## 2021-03-11 DIAGNOSIS — D485 Neoplasm of uncertain behavior of skin: Secondary | ICD-10-CM

## 2021-03-11 NOTE — Patient Instructions (Signed)

## 2021-03-27 ENCOUNTER — Encounter: Payer: Self-pay | Admitting: Dermatology

## 2021-03-27 NOTE — Progress Notes (Signed)
   New Patient   Subjective  Pamela Allen is a 45 y.o. female who presents for the following: Skin Problem (Mole on back near bra area that gets irritated x years).  Growth on back which is prone to recurrent irritation, small bumps on cheekbones Location:  Duration:  Quality:  Associated Signs/Symptoms: Modifying Factors:  Severity:  Timing: Context:    The following portions of the chart were reviewed this encounter and updated as appropriate:  Tobacco  Allergies  Meds  Problems  Med Hx  Surg Hx  Fam Hx       Objective  Well appearing patient in no apparent distress; mood and affect are within normal limits. Right Upper Back Raised 1 cm monochrome brown nodule; no dermoscopic atypia but with history of chronic irritation biopsy would be reasonable.       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    A focused examination was performed including neck, face.. Relevant physical exam findings are noted in the Assessment and Plan.   Assessment & Plan  Neoplasm of uncertain behavior of skin Right Upper Back  Skin / nail biopsy Type of biopsy: tangential   Informed consent: discussed and consent obtained   Timeout: patient name, date of birth, surgical site, and procedure verified   Anesthesia: the lesion was anesthetized in a standard fashion   Anesthetic:  1% lidocaine w/ epinephrine 1-100,000 local infiltration Instrument used: flexible razor blade   Hemostasis achieved with: ferric subsulfate   Outcome: patient tolerated procedure well   Post-procedure details: wound care instructions given    Specimen 1 - Surgical pathology Differential Diagnosis: SK/Atypia  Check Margins: No  Seborrheic keratosis (2) Left Zygomatic Area; Right Malar Cheek  No treatment medically necessary.  This was not discussed in detail with patient.

## 2021-05-30 ENCOUNTER — Other Ambulatory Visit: Payer: Self-pay | Admitting: Emergency Medicine

## 2021-05-30 DIAGNOSIS — Z1231 Encounter for screening mammogram for malignant neoplasm of breast: Secondary | ICD-10-CM

## 2021-06-19 ENCOUNTER — Other Ambulatory Visit: Payer: Self-pay | Admitting: Emergency Medicine

## 2021-06-19 DIAGNOSIS — I10 Essential (primary) hypertension: Secondary | ICD-10-CM

## 2021-07-02 ENCOUNTER — Ambulatory Visit
Admission: RE | Admit: 2021-07-02 | Discharge: 2021-07-02 | Disposition: A | Discharge status: Home / Self Care | Payer: 59 | Source: Ambulatory Visit | Attending: Emergency Medicine | Admitting: Emergency Medicine

## 2021-07-02 ENCOUNTER — Other Ambulatory Visit: Payer: Self-pay

## 2021-07-02 DIAGNOSIS — Z1231 Encounter for screening mammogram for malignant neoplasm of breast: Secondary | ICD-10-CM

## 2021-07-15 ENCOUNTER — Emergency Department (HOSPITAL_BASED_OUTPATIENT_CLINIC_OR_DEPARTMENT_OTHER): Payer: 59

## 2021-07-15 ENCOUNTER — Encounter (HOSPITAL_BASED_OUTPATIENT_CLINIC_OR_DEPARTMENT_OTHER): Payer: Self-pay

## 2021-07-15 ENCOUNTER — Emergency Department (HOSPITAL_BASED_OUTPATIENT_CLINIC_OR_DEPARTMENT_OTHER)
Admission: EM | Admit: 2021-07-15 | Discharge: 2021-07-15 | Disposition: A | Discharge status: Home / Self Care | Payer: 59 | Attending: Emergency Medicine | Admitting: Emergency Medicine

## 2021-07-15 ENCOUNTER — Other Ambulatory Visit: Payer: Self-pay

## 2021-07-15 DIAGNOSIS — R0789 Other chest pain: Secondary | ICD-10-CM | POA: Diagnosis present

## 2021-07-15 DIAGNOSIS — I10 Essential (primary) hypertension: Secondary | ICD-10-CM | POA: Diagnosis not present

## 2021-07-15 DIAGNOSIS — R0602 Shortness of breath: Secondary | ICD-10-CM | POA: Insufficient documentation

## 2021-07-15 DIAGNOSIS — R079 Chest pain, unspecified: Secondary | ICD-10-CM

## 2021-07-15 DIAGNOSIS — Z79899 Other long term (current) drug therapy: Secondary | ICD-10-CM | POA: Insufficient documentation

## 2021-07-15 LAB — CBC WITH DIFFERENTIAL/PLATELET
Abs Immature Granulocytes: 0.02 10*3/uL (ref 0.00–0.07)
Basophils Absolute: 0 10*3/uL (ref 0.0–0.1)
Basophils Relative: 1 %
Eosinophils Absolute: 0.1 10*3/uL (ref 0.0–0.5)
Eosinophils Relative: 1 %
HCT: 38.4 % (ref 36.0–46.0)
Hemoglobin: 12.8 g/dL (ref 12.0–15.0)
Immature Granulocytes: 0 %
Lymphocytes Relative: 47 %
Lymphs Abs: 3.3 10*3/uL (ref 0.7–4.0)
MCH: 30.5 pg (ref 26.0–34.0)
MCHC: 33.3 g/dL (ref 30.0–36.0)
MCV: 91.6 fL (ref 80.0–100.0)
Monocytes Absolute: 0.5 10*3/uL (ref 0.1–1.0)
Monocytes Relative: 7 %
Neutro Abs: 3 10*3/uL (ref 1.7–7.7)
Neutrophils Relative %: 44 %
Platelets: 312 10*3/uL (ref 150–400)
RBC: 4.19 MIL/uL (ref 3.87–5.11)
RDW: 11.9 % (ref 11.5–15.5)
WBC: 6.9 10*3/uL (ref 4.0–10.5)
nRBC: 0 % (ref 0.0–0.2)

## 2021-07-15 LAB — BASIC METABOLIC PANEL
Anion gap: 10 (ref 5–15)
BUN: 15 mg/dL (ref 6–20)
CO2: 23 mmol/L (ref 22–32)
Calcium: 9.2 mg/dL (ref 8.9–10.3)
Chloride: 103 mmol/L (ref 98–111)
Creatinine, Ser: 1.1 mg/dL — ABNORMAL HIGH (ref 0.44–1.00)
GFR, Estimated: >60 mL/min (ref >60)
Glucose, Bld: 110 mg/dL — ABNORMAL HIGH (ref 70–99)
Potassium: 4 mmol/L (ref 3.5–5.1)
Sodium: 136 mmol/L (ref 135–145)

## 2021-07-15 LAB — TROPONIN I (HIGH SENSITIVITY): Troponin I (High Sensitivity): <2 ng/L (ref <18)

## 2021-07-15 NOTE — ED Provider Notes (Signed)
[place] EMERGENCY DEPARTMENT Provider Note   CSN: [id number] Arrival date & time: 07/15/21  1129     History Chief Complaint  Patient presents with   Shortness of Breath    Pamela Allen is a 45 y.o. female.  45 yo F with a chief complaints of shortness of breath.  This started yesterday afternoon.  She just felt like she had to take a big breath to catch her breath.  This been going on since last night.  Nothing seems to make it better or worse.  With it being progressive she decided come into the ED for evaluation.  She denies cough congestion or fever.  Denies nausea vomiting or diarrhea.  Denies trauma to the chest.  She does feel like she has a little bit of a headache.  She took a COVID test that was negative.  She has a history of hypertension denies hyperlipidemia diabetes or smoking.  Mom had an MI in her 89s.  She denies history of PE or DVT denies hemoptysis denies unilateral lower extremity edema denies recent surgery immobilization or hospitalization denies estrogen use denies history of cancer.  The history is provided by the patient.  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   Iron deficiency 03/02/2018   Class 1 obesity due to excess calories with serious comorbidity and body mass index (BMI) of 34.0 to 34.9 in adult 03/02/2018   Seasonal allergies 01/02/2018   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     Family History  Problem Relation Age of Onset   Diabetes Mother    Heart disease Mother    Hypertension Mother    Stroke Mother    Cancer Father    Hypertension Father    Stroke Sister    Colon polyps Maternal Uncle    Colon cancer Neg Hx    Esophageal cancer Neg Hx    Rectal cancer Neg Hx    Stomach cancer Neg Hx     Social History   Tobacco Use   Smoking status: Never   Smokeless tobacco: Never  Vaping Use   Vaping Use: Never used  Substance Use Topics   Alcohol use: Yes    Comment: occasional   Drug use: No    Home Medications Prior to Admission medications   Medication Sig Start Date End Date Taking? Authorizing Provider  ALPRAZolam (XANAX) 0.5 MG tablet TAKE 1 TABLET(0.5 MG) BY MOUTH TWICE DAILY AS NEEDED  FOR ANXIETY 02/20/21   Horald Pollen, MD  amLODipine-valsartan (EXFORGE) 10-320 MG tablet TAKE 1 TABLET BY MOUTH DAILY 06/19/21   Horald Pollen, MD  cetirizine (ZYRTEC) 10 MG tablet Take 1 tablet (10 mg total) by mouth at bedtime. 01/29/21   Horald Pollen, MD  escitalopram (LEXAPRO) 10 MG tablet Take 1 tablet (10 mg total) by mouth daily. 08/22/20 11/20/20  Horald Pollen, MD  montelukast (SINGULAIR) 10 MG tablet Take 1 tablet (10 mg total) by mouth at bedtime. 01/29/21   Horald Pollen, MD  Multiple Vitamins-Minerals (MULTIVITAMIN WOMEN 50+ PO) Take by mouth.    [provider]  Olopatadine HCl (PATADAY) 0.2 % SOLN Apply 1 drop to eye daily. 03/23/19   Horald Pollen, MD    Allergies    Other  Review of Systems   Review of Systems  Constitutional:  Negative for chills and fever.   HENT:  Negative for congestion and rhinorrhea.   Eyes:  Negative for redness and visual disturbance.  Respiratory:  Positive for shortness of breath. Negative for wheezing.   Cardiovascular:  Positive for chest pain. Negative for palpitations.  Gastrointestinal:  Negative for nausea and vomiting.  Genitourinary:  Negative for dysuria and urgency.  Musculoskeletal:  Negative for arthralgias and myalgias.  Skin:  Negative for pallor and wound.  Neurological:  Negative for dizziness and headaches.   Physical Exam Updated Vital Signs BP 122/79   Pulse 60   Temp 98.3 F (36.8 C) (Oral)   Resp 16   Ht 5' 2"  (1.575 m)   Wt 88.5 kg   LMP 07/08/2021 (Exact Date)   SpO2 98%   BMI 35.67 kg/m   Physical Exam Vitals and nursing note reviewed.  Constitutional:      General: She is not in acute distress.    Appearance: She is well-developed. She is not diaphoretic.  HENT:     Head: Normocephalic and atraumatic.  Eyes:     Pupils: Pupils are equal, round, and reactive to light.  Cardiovascular:     Rate and Rhythm: Normal rate and regular rhythm.     Heart sounds: No murmur heard.   No friction rub. No gallop.  Pulmonary:     Effort: Pulmonary effort is normal.     Breath sounds: No wheezing or rales.  Chest:     Chest wall: Tenderness present.     Comments: Pain along the inferior aspect of the sternum reproduces the patient's symptoms. Abdominal:     General: There is no distension.     Palpations: Abdomen is soft.     Tenderness: There is no abdominal tenderness.  Musculoskeletal:        General: No tenderness.     Cervical back: Normal range of motion and neck supple.  Skin:    General: Skin is warm and dry.  Neurological:     Mental Status: She is alert and oriented to person, place, and time.  Psychiatric:        Behavior: Behavior normal.    ED Results / Procedures / Treatments   Labs (all labs ordered are listed, but only abnormal results are displayed) Labs  Reviewed  BASIC METABOLIC PANEL - Abnormal; Notable for the following components:      Result Value   Glucose, Bld 110 (*)    Creatinine, Ser 1.10 (*)    All other components within normal limits  CBC WITH DIFFERENTIAL/PLATELET  TROPONIN I (HIGH SENSITIVITY)    EKG  EKG Interpretation  Date/Time:  Monday July 15 2021 11:37:08 EDT Ventricular Rate:  75 PR Interval:  144 QRS Duration: 91 QT Interval:  365 QTC Calculation: 408 R Axis:   20 Text Interpretation: Sinus rhythm Low voltage, precordial leads Borderline T abnormalities, anterior leads No significant change since last tracing Confirmed by Deno Etienne [phone]) on 07/15/2021 12:14:26 PM  Radiology DG Chest Port 1 View  Result Date: 07/15/2021 CLINICAL DATA:  Chest pain in a 45 year old female. EXAM: PORTABLE CHEST 1 VIEW COMPARISON:  June 11, 2012 FINDINGS: EKG leads project over the chest. Trachea midline. Cardiomediastinal contours and hilar structures are normal. Lungs are clear. No visible pneumothorax or pleural effusion. On limited assessment there is no acute skeletal process. IMPRESSION: No acute cardiopulmonary disease. Electronically Signed   By: Zetta Bills M.D.   On: 07/15/2021 12:57    Procedures Procedures   Medications Ordered in ED Medications - No data to display  ED Course  I have reviewed the triage vital signs and the nursing notes.  Pertinent labs & imaging results that were available during my care of the patient were reviewed by me and considered in my medical decision making (see chart for details).    MDM Rules/Calculators/A&P                           45 yo F with a chief complaint of chest pain.  This been going on since yesterday afternoon.  Atypical in nature and reproduced on exam.  EKG without concerning finding.  We will obtain a single troponin as her symptoms did not significantly change in 6 hours chest x-ray. PERC negative.   Chest x-ray viewed by me without focal  infiltrate.  Troponin negative.  No significant anemia no significant electrolyte abnormality.  Will treat as musculoskeletal.  PCP follow-up.  1:54 PM:  I have discussed the diagnosis/risks/treatment options with the patient and believe the pt to be eligible for discharge home to follow-up with PCP. We also discussed returning to the ED immediately if new or worsening sx occur. We discussed the sx which are most concerning (e.g., sudden worsening pain, fever, inability to tolerate by mouth) that necessitate immediate return. Medications administered to the patient during their visit and any new prescriptions provided to the patient are listed below.  Medications given during this visit Medications - No data to display   The patient appears reasonably screen and/or stabilized for discharge and I doubt any other medical condition or other [facility] requiring further screening, evaluation, or treatment in the ED at this time prior to discharge.    Final Clinical Impression(s) / ED Diagnoses Final diagnoses:  Nonspecific chest pain    Rx / DC Orders ED Discharge Orders     None        Deno Etienne, DO 07/15/21 1354

## 2021-07-15 NOTE — Discharge Instructions (Signed)
Take 4 over the counter ibuprofen tablets 3 times a day or 2 over-the-counter naproxen tablets twice a day for pain. Also take tylenol 1051m(2 extra strength) four times a day.

## 2021-07-15 NOTE — ED Triage Notes (Signed)
Pt developed shortness of breath last night and centralized CP today. Denies nausea and no radiation.

## 2021-07-16 ENCOUNTER — Encounter: Payer: Self-pay | Admitting: Internal Medicine

## 2021-07-16 ENCOUNTER — Ambulatory Visit (INDEPENDENT_AMBULATORY_CARE_PROVIDER_SITE_OTHER): Payer: 59 | Admitting: Internal Medicine

## 2021-07-16 ENCOUNTER — Ambulatory Visit: Payer: 59 | Admitting: Family

## 2021-07-16 DIAGNOSIS — R0602 Shortness of breath: Secondary | ICD-10-CM | POA: Diagnosis not present

## 2021-07-16 DIAGNOSIS — Z8249 Family history of ischemic heart disease and other diseases of the circulatory system: Secondary | ICD-10-CM

## 2021-07-16 DIAGNOSIS — R0600 Dyspnea, unspecified: Secondary | ICD-10-CM | POA: Insufficient documentation

## 2021-07-16 LAB — D-DIMER, QUANTITATIVE: D-Dimer, Quant: <0.19 mcg/mL FEU (ref <0.50)

## 2021-07-16 LAB — BRAIN NATRIURETIC PEPTIDE: Pro B Natriuretic peptide (BNP): 9 pg/mL (ref 0.0–100.0)

## 2021-07-16 MED ORDER — TRELEGY ELLIPTA 100-62.5-25 MCG/ACT IN AEPB: 1.0000 | INHALATION_SPRAY | Freq: Every day | RESPIRATORY_TRACT | 11 refills | Status: AC

## 2021-07-16 MED ORDER — RIVAROXABAN (XARELTO) VTE STARTER PACK (15 & 20 MG): ORAL_TABLET | ORAL | 0 refills | Status: DC

## 2021-07-16 NOTE — Patient Instructions (Signed)
Go to ER if worse

## 2021-07-16 NOTE — Assessment & Plan Note (Signed)
D dimer stat

## 2021-07-16 NOTE — Progress Notes (Signed)
Subjective:  Patient ID: Pamela Allen, female    DOB: 1976-03-15  Age: 45 y.o. MRN: [id number]  CC: Follow-up (ER follow-up)   HPI Pamela Allen presents for SOB, need to take a deep breath with med CP since Sun. Pt slept in her recliner. COVD test was (-) Mom had DVT, sister and aunt had DVT ER notes reviewed  Outpatient Medications Prior to Visit  Medication Sig Dispense Refill   ALPRAZolam (XANAX) 0.5 MG tablet TAKE 1 TABLET(0.5 MG) BY MOUTH TWICE DAILY AS NEEDED FOR ANXIETY 20 tablet 1   amLODipine-valsartan (EXFORGE) 10-320 MG tablet TAKE 1 TABLET BY MOUTH DAILY 90 tablet 3   cetirizine (ZYRTEC) 10 MG tablet Take 1 tablet (10 mg total) by mouth at bedtime. 90 tablet 3   montelukast (SINGULAIR) 10 MG tablet Take 1 tablet (10 mg total) by mouth at bedtime. 90 tablet 3   Multiple Vitamins-Minerals (MULTIVITAMIN WOMEN 50+ PO) Take by mouth.     Olopatadine HCl (PATADAY) 0.2 % SOLN Apply 1 drop to eye daily. 2.5 mL 5   escitalopram (LEXAPRO) 10 MG tablet Take 1 tablet (10 mg total) by mouth daily. 90 tablet 1   0.9 %  sodium chloride infusion      No facility-administered medications prior to visit.    ROS: Review of Systems  Constitutional:  Positive for fatigue. Negative for activity change, appetite change, chills and unexpected weight change.  HENT:  Negative for congestion, mouth sores and sinus pressure.   Eyes:  Negative for visual disturbance.  Respiratory:  Positive for chest tightness and shortness of breath. Negative for cough.   Cardiovascular:  Positive for chest pain.  Gastrointestinal:  Negative for abdominal pain and nausea.  Genitourinary:  Negative for difficulty urinating, frequency and vaginal pain.  Musculoskeletal:  Negative for back pain and gait problem.  Skin:  Negative for pallor and rash.  Neurological:  Negative for dizziness, tremors, weakness, numbness and headaches.  Psychiatric/Behavioral:  Negative for confusion and sleep disturbance.     Objective:  BP 120/82 (BP Location: Left Arm)   Pulse 79   Temp 98.6 F (37 C) (Oral)   Ht 5' 2"  (1.575 m)   Wt 196 lb 6.4 oz (89.1 kg)   LMP 07/08/2021 (Exact Date)   SpO2 97%   BMI 35.92 kg/m   BP Readings from Last 3 Encounters:  07/16/21 120/82  07/15/21 130/83  02/13/21 129/82    Wt Readings from Last 3 Encounters:  07/16/21 196 lb 6.4 oz (89.1 kg)  07/15/21 195 lb (88.5 kg)  02/13/21 196 lb (88.9 kg)    Physical Exam Constitutional:      General: She is not in acute distress.    Appearance: She is well-developed. She is obese. She is not toxic-appearing.  HENT:     Head: Normocephalic.     Right Ear: External ear normal.     Left Ear: External ear normal.     Nose: Nose normal.  Eyes:     General:        Right eye: No discharge.        Left eye: No discharge.     Conjunctiva/sclera: Conjunctivae normal.     Pupils: Pupils are equal, round, and reactive to light.  Neck:     Thyroid: No thyromegaly.     Vascular: No JVD.     Trachea: No tracheal deviation.  Cardiovascular:     Rate and Rhythm: Normal rate and regular rhythm.  Heart sounds: Normal heart sounds.  Pulmonary:     Effort: No respiratory distress.     Breath sounds: No stridor. No wheezing.  Abdominal:     General: Bowel sounds are normal. There is no distension.     Palpations: Abdomen is soft. There is no mass.     Tenderness: There is no abdominal tenderness. There is no guarding or rebound.  Musculoskeletal:        General: No tenderness.     Cervical back: Normal range of motion and neck supple. No rigidity.  Lymphadenopathy:     Cervical: No cervical adenopathy.  Skin:    Findings: No erythema or rash.  Neurological:     Mental Status: She is oriented to person, place, and time.     Cranial Nerves: No cranial nerve deficit.     Motor: No abnormal muscle tone.     Coordination: Coordination normal.     Deep Tendon Reflexes: Reflexes normal.  Psychiatric:        Behavior:  Behavior normal.        Thought Content: Thought content normal.        Judgment: Judgment normal.  Somewhat dyspneic at rest, w/activity   I personally provided Trelegy inhaler use teaching. After teaching the patient should be able to use effectively. All questions were answered    A total time of 45 minutes was spent preparing to see the patient, reviewing tests, x-rays, operative reports and ER records.  Also, obtaining history and performing comprehensive physical exam.  Additionally, counseling the patient regarding the above listed issues r/o DVT/PE.   Finally, documenting clinical information in the health records, coordination of care, educating the patient.   Lab Results  Component Value Date   WBC 6.9 07/15/2021   HGB 12.8 07/15/2021   HCT 38.4 07/15/2021   PLT 312 07/15/2021   GLUCOSE 110 (H) 07/15/2021   CHOL 158 11/15/2020   TRIG 58 11/15/2020   HDL 51 11/15/2020   LDLDIRECT 94 11/10/2014   LDLCALC 95 11/15/2020   ALT 16 11/15/2020   AST 19 11/15/2020   NA 136 07/15/2021   K 4.0 07/15/2021   CL 103 07/15/2021   CREATININE 1.10 (H) 07/15/2021   BUN 15 07/15/2021   CO2 23 07/15/2021   TSH 1.620 03/03/2018   HGBA1C 5.2 11/15/2020    DG Chest Port 1 View  Result Date: 07/15/2021 CLINICAL DATA:  Chest pain in a 45 year old female. EXAM: PORTABLE CHEST 1 VIEW COMPARISON:  June 11, 2012 FINDINGS: EKG leads project over the chest. Trachea midline. Cardiomediastinal contours and hilar structures are normal. Lungs are clear. No visible pneumothorax or pleural effusion. On limited assessment there is no acute skeletal process. IMPRESSION: No acute cardiopulmonary disease. Electronically Signed   By: Zetta Bills M.D.   On: 07/15/2021 12:57    Assessment & Plan:   Problem List Items Addressed This Visit     Dyspnea    New SOB, need to take a deep breath with med CP since Sun. Pt slept in her recliner. Mom had DVT, sister and aunt had DVT D dimer, BNP  STAT Trelegy qd Xarelto if pos D dimer      Relevant Orders   D-dimer, quantitative   B Nat Peptide   Family history of DVT    D dimer stat      Relevant Orders   D-dimer, quantitative   B Nat Peptide      Meds ordered this encounter  Medications  Fluticasone-Umeclidin-Vilant (TRELEGY ELLIPTA) 100-62.5-25 MCG/ACT AEPB    Sig: Inhale 1 puff into the lungs daily.    Dispense:  1 each    Refill:  11   RIVAROXABAN (XARELTO) VTE STARTER PACK (15 & 20 MG)    Sig: Follow package directions: Take one 66m tablet by mouth twice a day. On day 22, switch to one 234mtablet once a day. Take with food.    Dispense:  51 each    Refill:  0      Follow-up: Return in about 1 week (around 07/23/2021) for a follow-up visit.  AlWalker KehrMD

## 2021-07-16 NOTE — Assessment & Plan Note (Addendum)
New SOB, need to take a deep breath with med CP since Sun. Pt slept in her recliner. Mom had DVT, sister and aunt had DVT D dimer, BNP STAT Trelegy qd Xarelto if pos D dimer

## 2021-07-18 ENCOUNTER — Ambulatory Visit: Payer: 59 | Admitting: Internal Medicine

## 2021-07-19 ENCOUNTER — Ambulatory Visit: Payer: 59 | Admitting: Internal Medicine

## 2021-07-19 ENCOUNTER — Telehealth: Payer: Self-pay | Admitting: *Deleted

## 2021-07-19 NOTE — Telephone Encounter (Signed)
Rec'd PA for pt Xarelto starter pack. Submitted vis cover-my-meds w/ (Key: [id number]). Waiting on insurance determination.Marland KitchenJohny Allen

## 2021-07-26 NOTE — Telephone Encounter (Signed)
Never received determination (Unknown). Pt is schedule to f/u w/ Dr. Franky Macho on 07/29/21.Marland KitchenJohny Chess

## 2021-07-29 ENCOUNTER — Ambulatory Visit (INDEPENDENT_AMBULATORY_CARE_PROVIDER_SITE_OTHER): Payer: 59 | Admitting: Emergency Medicine

## 2021-07-29 ENCOUNTER — Encounter: Payer: Self-pay | Admitting: Emergency Medicine

## 2021-07-29 ENCOUNTER — Other Ambulatory Visit: Payer: Self-pay

## 2021-07-29 VITALS — BP 126/86 | HR 87 | Ht 62.0 in | Wt 196.0 lb

## 2021-07-29 DIAGNOSIS — R0602 Shortness of breath: Secondary | ICD-10-CM | POA: Diagnosis not present

## 2021-07-29 DIAGNOSIS — I10 Essential (primary) hypertension: Secondary | ICD-10-CM

## 2021-07-29 DIAGNOSIS — F4323 Adjustment disorder with mixed anxiety and depressed mood: Secondary | ICD-10-CM

## 2021-07-29 MED ORDER — BUSPIRONE HCL 7.5 MG PO TABS: 7.5000 mg | ORAL_TABLET | Freq: Two times a day (BID) | ORAL | 3 refills | Status: AC

## 2021-07-29 MED ORDER — ESCITALOPRAM OXALATE 20 MG PO TABS: 20.0000 mg | ORAL_TABLET | Freq: Every day | ORAL | 3 refills | Status: DC

## 2021-07-29 NOTE — Assessment & Plan Note (Signed)
Well-controlled hypertension.  Continue amlodipine- valsartan 10-320 mg daily. BP Readings from Last 3 Encounters:  07/29/21 126/86  07/16/21 120/82  07/15/21 130/83

## 2021-07-29 NOTE — Patient Instructions (Signed)
Managing Stress, Adult Feeling a certain amount of stress is normal. Stress helps our body and mind get ready to deal with the demands of life. Stress hormones can motivate you to do well at work and meet your responsibilities. But severe or long-term (chronic) stress can affect your mental and physical health. Chronic stress puts you at higher risk for: Anxiety and depression. Other health problems such as digestive problems, muscle aches, heart disease, high blood pressure, and stroke. What are the causes? Common causes of stress include: Demands from work, such as deadlines, feeling overworked, or having long hours. Pressures at home, such as money issues, disagreements with a spouse, or parenting issues. Pressures from major life changes, such as divorce, moving, loss of a loved one, or chronic illness. You may be at higher risk for stress-related problems if you: Do not get enough sleep. Are in poor health. Do not have emotional support. Have a mental health disorder such as anxiety or depression. How to recognize stress Stress can make you: Have trouble sleeping. Feel sad, anxious, irritable, or overwhelmed. Lose your appetite. Overeat or want to eat unhealthy foods. Want to use drugs or alcohol. Stress can also cause physical symptoms, such as: Sore, tense muscles, especially in the shoulders and neck. Headaches. Trouble breathing. A faster heart rate. Stomach pain, nausea, or vomiting. Diarrhea or constipation. Trouble concentrating. Follow these instructions at home: Eating and drinking Eat a healthy diet. This includes: Eating foods that are high in fiber, such as beans, whole grains, and fresh fruits and vegetables. Limiting foods that are high in fat and processed sugars, such as fried or sweet foods. Do not skip meals or overeat. Drink enough fluid to keep your urine pale yellow. Alcohol use Do not drink alcohol if: Your health care provider tells you not to  drink. You are pregnant, may be pregnant, or are planning to become pregnant. Drinking alcohol is a way some people try to ease their stress. This can be dangerous, so if you drink alcohol: Limit how much you have to: 0-1 drink a day for women. 0-2 drinks a day for men. Know how much alcohol is in your drink. In the [place] one drink equals one 12 oz bottle of beer (355 mL), one 5 oz glass of wine (148 mL), or one 1 oz glass of hard liquor (44 mL). Activity  Include 30 minutes of exercise in your daily schedule. Exercise is a good stress reducer. Include time in your day for an activity that you find relaxing. Try taking a walk, going on a bike ride, reading a book, or listening to music. Schedule your time in a way that lowers stress, and keep a regular schedule. Focus on doing what is most important to get done. Lifestyle Identify the source of your stress and your reaction to it. See a therapist who can help you change unhelpful reactions. When there are stressful events: Talk about them with family, friends, or coworkers. Try to think realistically about stressful events and not ignore them or overreact. Try to find the positives in a stressful situation and not focus on the negatives. Cut back on responsibilities at work and home, if possible. Ask for help from friends or family members if you need it. Find ways to manage stress, such as: Mindfulness, meditation, or deep breathing. Yoga or tai chi. Progressive muscle relaxation. Spending time in nature. Doing art, playing music, or reading. Making time for fun activities. Spending time with family and friends. Get support  from family, friends, or spiritual resources. °General instructions °Get enough sleep. Try to go to sleep and get up at about the same time every day. °Take over-the-counter and prescription medicines only as told by your health care provider. °Do not use any products that contain nicotine or tobacco. These products  include cigarettes, chewing tobacco, and vaping devices, such as e-cigarettes. If you need help quitting, ask your health care provider. °Do not use drugs or smoke to deal with stress. °Keep all follow-up visits. This is important. °Where to find support °Talk with your health care provider about stress management or finding a support group. °Find a therapist to work with you on your stress management techniques. °Where to find more information °National Alliance on Mental Illness: www.nami.org °American Psychological Association: www.apa.org °Contact a health care provider if: °Your stress symptoms get worse. °You are unable to manage your stress at home. °You are struggling to stop using drugs or alcohol. °Get help right away if: °You may be a danger to yourself or others. °You have any thoughts of death or suicide. °Get help right awayif you feel like you may hurt yourself or others, or have thoughts about taking your own life. Go to your nearest emergency room or: °Call 911. °Call the National Suicide Prevention Lifeline at 1-800-273-8255 or 988 in the [place] This is open 24 hours a day. °Text the Crisis Text Line at 741741. °Summary °Feeling a certain amount of stress is normal, but severe or long-term (chronic) stress can affect your mental and physical health. °Chronic stress can put you at higher risk for anxiety, depression, and other health problems such as digestive problems, muscle aches, heart disease, high blood pressure, and stroke. °You may be at higher risk for stress-related problems if you do not get enough sleep, are in poor health, lack emotional support, or have a mental health disorder such as anxiety or depression. °Identify the source of your stress and your reaction to it. Try talking about stressful events with family, friends, or coworkers, finding a coping method, or getting support from spiritual resources. °If you need more help, talk with your health care provider about finding a  support group or a mental health therapist. °This information is not intended to replace advice given to you by your health care provider. Make sure you discuss any questions you have with your health care provider. °Document Revised: 03/28/2021 Document Reviewed: 03/26/2021 °Elsevier Patient Education © 2022 [employer]. ° °

## 2021-07-29 NOTE — Assessment & Plan Note (Signed)
Stable.  Normal O2 sats.  Normal physical exam.  Normal recent chest x-ray. Related to anxiety.

## 2021-07-29 NOTE — Progress Notes (Signed)
Pamela Allen 45 y.o.   Chief Complaint  Patient presents with   Follow-up    SOB and BP, pt states she feels better, still have SOB    HISTORY OF PRESENT ILLNESS: This is a 45 y.o. female shortness of breath triggered by anxiety. Anxiety levels have been through the roof with both work and family issues. Taking Lexapro intermittently. Seen in the emergency room on 07/15/2021 and here in the office on 07/16/2021 for the same.  Complaining of intermittent episodes of shortness of breath.  Normal chest x-ray.  Normal EKG.  Normal blood work. No other complaints or medical concerns.  HPI   Prior to Admission medications   Medication Sig Start Date End Date Taking? Authorizing Provider  ALPRAZolam (XANAX) 0.5 MG tablet TAKE 1 TABLET(0.5 MG) BY MOUTH TWICE DAILY AS NEEDED FOR ANXIETY 02/20/21  Yes Toni Demo, Ines Bloomer, MD  amLODipine-valsartan (EXFORGE) 10-320 MG tablet TAKE 1 TABLET BY MOUTH DAILY 06/19/21  Yes Carrianne Hyun, Ines Bloomer, MD  cetirizine (ZYRTEC) 10 MG tablet Take 1 tablet (10 mg total) by mouth at bedtime. 01/29/21  Yes Elis Sauber, Ines Bloomer, MD  Fluticasone-Umeclidin-Vilant (TRELEGY ELLIPTA) 100-62.5-25 MCG/ACT AEPB Inhale 1 puff into the lungs daily. 07/16/21  Yes Plotnikov, Evie Lacks, MD  montelukast (SINGULAIR) 10 MG tablet Take 1 tablet (10 mg total) by mouth at bedtime. 01/29/21  Yes Jermaine Neuharth, Ines Bloomer, MD  Multiple Vitamins-Minerals (MULTIVITAMIN WOMEN 50+ PO) Take by mouth.   Yes [provider]  Olopatadine HCl (PATADAY) 0.2 % SOLN Apply 1 drop to eye daily. 03/23/19  Yes Syrita Dovel, Ines Bloomer, MD  RIVAROXABAN Alveda Reasons) VTE STARTER PACK (15 & 20 MG) Follow package directions: Take one 86m tablet by mouth twice a day. On day 22, switch to one 233mtablet once a day. Take with food. 07/16/21  Yes Plotnikov, AlEvie LacksMD  escitalopram (LEXAPRO) 10 MG tablet Take 1 tablet (10 mg total) by mouth daily. 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   Vitamin D deficiency 11/11/2014   HTN (hypertension) 11/21/2011    Past Medical History:  Diagnosis Date   Allergy    Anemia    yrs ago with pregnancy 28 yrs ago    Anxiety    Hypertension    Mass    left buttock    Past Surgical History:  Procedure Laterality Date   BREAST BIOPSY Left 2017   MASS EXCISION Left 01/17/2016   Procedure: EXCISION BIOPSY OF MASS LEFT BUTTOCK ;  Surgeon: AlLeighton RuffMD;  [facility] [facility];  Laterality: Left;   TUBAL LIGATION Bilateral 10/30/2003   w/ Bilateral Salpingectomy   WISDOM TOOTH EXTRACTION  2017   lft side     Social History   Socioeconomic History   Marital status: Married    Spouse name: Not on file   Number of children: Not on file   Years of education: Not on file   Highest education level: Not  on file  Occupational History   Not on file  Tobacco Use   Smoking status: Never   Smokeless tobacco: Never  Vaping Use   Vaping Use: Never used  Substance and Sexual Activity   Alcohol use: Yes    Comment: occasional   Drug use: No   Sexual activity: Not on file  Other Topics Concern   Not on file  Social History Narrative   Married   Cytogeneticist / CNA   Social Determinants of Health   Financial Resource Strain: Not on file  Food Insecurity: Not on file  Transportation Needs: Not on file  Physical Activity: Not on file  Stress: Not on file  Social Connections: Not on file  Intimate Partner  Violence: Not on file    Family History  Problem Relation Age of Onset   Diabetes Mother    Heart disease Mother    Hypertension Mother    Stroke Mother    Cancer Father    Hypertension Father    Stroke Sister    Colon polyps Maternal Uncle    Colon cancer Neg Hx    Esophageal cancer Neg Hx    Rectal cancer Neg Hx    Stomach cancer Neg Hx      Review of Systems  Constitutional: Negative.  Negative for chills and fever.  HENT: Negative.  Negative for congestion and sore throat.   Respiratory:  Positive for shortness of breath. Negative for cough, hemoptysis, sputum production and wheezing.   Cardiovascular: Negative.  Negative for chest pain and palpitations.  Genitourinary: Negative.  Negative for dysuria and hematuria.  Skin: Negative.  Negative for rash.  Neurological: Negative.  Negative for dizziness and headaches.  Psychiatric/Behavioral:  Positive for depression. The patient is nervous/anxious.   All other systems reviewed and are negative.  Today's Vitals   07/29/21 1007  BP: 126/86  Pulse: 87  SpO2: 97%  Weight: 196 lb (88.9 kg)  Height: _0  (1.575 m)   Body mass index is 35.85 kg/m.  Physical Exam Vitals reviewed.  Constitutional:      Appearance: Normal appearance. She is obese.  HENT:     Head: Normocephalic.  Eyes:     Extraocular Movements: Extraocular movements intact.     Conjunctiva/sclera: Conjunctivae normal.     Pupils: Pupils are equal, round, and reactive to light.  Cardiovascular:     Rate and Rhythm: Normal rate and regular rhythm.     Pulses: Normal pulses.     Heart sounds: Normal heart sounds.  Pulmonary:     Effort: Pulmonary effort is normal.     Breath sounds: Normal breath sounds.  Musculoskeletal:        General: No swelling or tenderness.     Cervical back: Normal range of motion and neck supple.     Right lower leg: No edema.     Left lower leg: No edema.  Skin:    General: Skin is warm and dry.     Capillary Refill:  Capillary refill takes less than 2 seconds.  Neurological:     General: No focal deficit present.     Mental Status: She is alert and oriented to person, place, and time.  Psychiatric:        Mood and Affect: Mood normal.        Behavior: Behavior normal.     ASSESSMENT & PLAN: A total of 45 minutes was spent with the patient and counseling/coordination of  care regarding preparing for this visit, review of most recent office visit notes, review of all medications and changes made including addition of BuSpar for anxiety management, review of most recent blood work results, review of most recent emergency department visit and office visit with Dr. Alain Marion, differential diagnosis of difficulty breathing, comprehensive history and physical exam, prognosis, documentation, and need for follow-up.  Problem List Items Addressed This Visit       Cardiovascular and Mediastinum   Essential hypertension    Well-controlled hypertension.  Continue amlodipine- valsartan 10-320 mg daily. BP Readings from Last 3 Encounters:  07/29/21 126/86  07/16/21 120/82  07/15/21 130/83           Other   Situational mixed anxiety and depressive disorder - Primary    Uncontrolled.  Restart Lexapro at 20 mg daily and use BuSpar 75 mg twice a day. Stress management and need to identify and correct triggers discussed with patient. Follow-up in 3 months.  Earlier as needed.      Relevant Medications   escitalopram (LEXAPRO) 20 MG tablet   busPIRone (BUSPAR) 7.5 MG tablet   Dyspnea    Stable.  Normal O2 sats.  Normal physical exam.  Normal recent chest x-ray. Related to anxiety.      Patient Instructions  Managing Stress, Adult Feeling a certain amount of stress is normal. Stress helps our body and mind get ready to deal with the demands of life. Stress hormones can motivate you to do well at work and meet your responsibilities. But severe or long-term (chronic) stress can affect your mental and  physical health. Chronic stress puts you at higher risk for: Anxiety and depression. Other health problems such as digestive problems, muscle aches, heart disease, high blood pressure, and stroke. What are the causes? Common causes of stress include: Demands from work, such as deadlines, feeling overworked, or having long hours. Pressures at home, such as money issues, disagreements with a spouse, or parenting issues. Pressures from major life changes, such as divorce, moving, loss of a loved one, or chronic illness. You may be at higher risk for stress-related problems if you: Do not get enough sleep. Are in poor health. Do not have emotional support. Have a mental health disorder such as anxiety or depression. How to recognize stress Stress can make you: Have trouble sleeping. Feel sad, anxious, irritable, or overwhelmed. Lose your appetite. Overeat or want to eat unhealthy foods. Want to use drugs or alcohol. Stress can also cause physical symptoms, such as: Sore, tense muscles, especially in the shoulders and neck. Headaches. Trouble breathing. A faster heart rate. Stomach pain, nausea, or vomiting. Diarrhea or constipation. Trouble concentrating. Follow these instructions at home: Eating and drinking Eat a healthy diet. This includes: Eating foods that are high in fiber, such as beans, whole grains, and fresh fruits and vegetables. Limiting foods that are high in fat and processed sugars, such as fried or sweet foods. Do not skip meals or overeat. Drink enough fluid to keep your urine pale yellow. Alcohol use Do not drink alcohol if: Your health care provider tells you not to drink. You are pregnant, may be pregnant, or are planning to become pregnant. Drinking alcohol is a way some people try to ease their stress. This can be dangerous, so if you drink alcohol: Limit how much you have to: 0-1 drink a day for women. 0-2 drinks a day for men. Know how much alcohol is in  your drink. In the [place] one drink  equals one 12 oz bottle of beer (355 mL), one 5 oz glass of wine (148 mL), or one 1 oz glass of hard liquor (44 mL). Activity  Include 30 minutes of exercise in your daily schedule. Exercise is a good stress reducer. Include time in your day for an activity that you find relaxing. Try taking a walk, going on a bike ride, reading a book, or listening to music. Schedule your time in a way that lowers stress, and keep a regular schedule. Focus on doing what is most important to get done. Lifestyle Identify the source of your stress and your reaction to it. See a therapist who can help you change unhelpful reactions. When there are stressful events: Talk about them with family, friends, or coworkers. Try to think realistically about stressful events and not ignore them or overreact. Try to find the positives in a stressful situation and not focus on the negatives. Cut back on responsibilities at work and home, if possible. Ask for help from friends or family members if you need it. Find ways to manage stress, such as: Mindfulness, meditation, or deep breathing. Yoga or tai chi. Progressive muscle relaxation. Spending time in nature. Doing art, playing music, or reading. Making time for fun activities. Spending time with family and friends. Get support from family, friends, or spiritual resources. General instructions Get enough sleep. Try to go to sleep and get up at about the same time every day. Take over-the-counter and prescription medicines only as told by your health care provider. Do not use any products that contain nicotine or tobacco. These products include cigarettes, chewing tobacco, and vaping devices, such as e-cigarettes. If you need help quitting, ask your health care provider. Do not use drugs or smoke to deal with stress. Keep all follow-up visits. This is important. Where to find support Talk with your health care provider about stress  management or finding a support group. Find a therapist to work with you on your stress management techniques. Where to find more information [employer] on Mental Illness: www.nami.org American Psychological Association: TVStereos.ch Contact a health care provider if: Your stress symptoms get worse. You are unable to manage your stress at home. You are struggling to stop using drugs or alcohol. Get help right away if: You may be a danger to yourself or others. You have any thoughts of death or suicide. Get help right awayif you feel like you may hurt yourself or others, or have thoughts about taking your own life. Go to your nearest emergency room or: Call 911. Call the [facility] at 629-377-2121 or 988 in the [place] This is open 24 hours a day. Text the Crisis Text Line at 618-235-7983. Summary Feeling a certain amount of stress is normal, but severe or long-term (chronic) stress can affect your mental and physical health. Chronic stress can put you at higher risk for anxiety, depression, and other health problems such as digestive problems, muscle aches, heart disease, high blood pressure, and stroke. You may be at higher risk for stress-related problems if you do not get enough sleep, are in poor health, lack emotional support, or have a mental health disorder such as anxiety or depression. Identify the source of your stress and your reaction to it. Try talking about stressful events with family, friends, or coworkers, finding a coping method, or getting support from spiritual resources. If you need more help, talk with your health care provider about finding a support group or a mental health  therapist. This information is not intended to replace advice given to you by your health care provider. Make sure you discuss any questions you have with your health care provider. Document Revised: 03/28/2021 Document Reviewed: 03/26/2021 Elsevier Patient Education  2022  [place], MD [facility]

## 2021-07-29 NOTE — Assessment & Plan Note (Signed)
Uncontrolled.  Restart Lexapro at 20 mg daily and use BuSpar 75 mg twice a day. Stress management and need to identify and correct triggers discussed with patient. Follow-up in 3 months.  Earlier as needed.

## 2021-10-30 ENCOUNTER — Encounter: Payer: Self-pay | Admitting: Emergency Medicine

## 2021-10-30 ENCOUNTER — Ambulatory Visit (INDEPENDENT_AMBULATORY_CARE_PROVIDER_SITE_OTHER): Payer: 59 | Admitting: Emergency Medicine

## 2021-10-30 ENCOUNTER — Other Ambulatory Visit: Payer: Self-pay

## 2021-10-30 VITALS — BP 122/80 | HR 78 | Temp 98.3°F | Ht 62.0 in | Wt 196.0 lb

## 2021-10-30 DIAGNOSIS — N951 Menopausal and female climacteric states: Secondary | ICD-10-CM | POA: Diagnosis not present

## 2021-10-30 DIAGNOSIS — I10 Essential (primary) hypertension: Secondary | ICD-10-CM

## 2021-10-30 DIAGNOSIS — F4323 Adjustment disorder with mixed anxiety and depressed mood: Secondary | ICD-10-CM

## 2021-10-30 DIAGNOSIS — Z86018 Personal history of other benign neoplasm: Secondary | ICD-10-CM | POA: Insufficient documentation

## 2021-10-30 LAB — COMPREHENSIVE METABOLIC PANEL
ALT: 18 U/L (ref 0–35)
AST: 19 U/L (ref 0–37)
Albumin: 4.5 g/dL (ref 3.5–5.2)
Alkaline Phosphatase: 45 U/L (ref 39–117)
BUN: 11 mg/dL (ref 6–23)
CO2: 26 mEq/L (ref 19–32)
Calcium: 9.7 mg/dL (ref 8.4–10.5)
Chloride: 104 mEq/L (ref 96–112)
Creatinine, Ser: 0.99 mg/dL (ref 0.40–1.20)
GFR: 68.15 mL/min (ref >60.00)
Glucose, Bld: 95 mg/dL (ref 70–99)
Potassium: 4.5 mEq/L (ref 3.5–5.1)
Sodium: 137 mEq/L (ref 135–145)
Total Bilirubin: 0.3 mg/dL (ref 0.2–1.2)
Total Protein: 8.6 g/dL — ABNORMAL HIGH (ref 6.0–8.3)

## 2021-10-30 LAB — LIPID PANEL
Cholesterol: 175 mg/dL (ref 0–200)
HDL: 63.7 mg/dL (ref >39.00)
LDL Cholesterol: 90 mg/dL (ref 0–99)
NonHDL: 111.1
Total CHOL/HDL Ratio: 3
Triglycerides: 104 mg/dL (ref 0.0–149.0)
VLDL: 20.8 mg/dL (ref 0.0–40.0)

## 2021-10-30 LAB — HEMOGLOBIN A1C: Hgb A1c MFr Bld: 5 % (ref 4.6–6.5)

## 2021-10-30 MED ORDER — FLUCONAZOLE 150 MG PO TABS: 150.0000 mg | ORAL_TABLET | Freq: Once | ORAL | 5 refills | Status: DC

## 2021-10-30 NOTE — Assessment & Plan Note (Signed)
Stable.  Continue Lexapro 20 mg daily.

## 2021-10-30 NOTE — Patient Instructions (Signed)

## 2021-10-30 NOTE — Assessment & Plan Note (Signed)
Well-controlled hypertension.  Continue Exforge 10-320 mg daily. BP Readings from Last 3 Encounters:  10/30/21 122/80  07/29/21 126/86  07/16/21 120/82  Dietary approaches to stop hypertension discussed.

## 2021-10-30 NOTE — Assessment & Plan Note (Signed)
Needs GYN evaluation.  Referral placed today. Also has history of uterine fibroids.

## 2021-10-30 NOTE — Progress Notes (Signed)
Pamela Allen 46 y.o.   Chief Complaint  Patient presents with   Follow-up    No concerns.    Medication Refill    Fluconazole refill, the dr told her he was going to send her in 2 pills but only ended up sending in 1 pill.    HISTORY OF PRESENT ILLNESS: This is a 46 y.o. female with history of hypertension here for follow-up. Concerned about her weight. Anxiety and depression much better on Lexapro 20 mg daily. Perimenopausal.  Menstrual periods becoming irregular.  Needs GYN referral. Blood pressure doing very well.  Normal readings at home. No other concerns or medical concerns today.  HPI   Prior to Admission medications   Medication Sig Start Date End Date Taking? Authorizing Provider  ALPRAZolam (XANAX) 0.5 MG tablet TAKE 1 TABLET(0.5 MG) BY MOUTH TWICE DAILY AS NEEDED FOR ANXIETY 02/20/21  Yes Nichole Keltner, Ines Bloomer, MD  amLODipine-valsartan (EXFORGE) 10-320 MG tablet TAKE 1 TABLET BY MOUTH DAILY 06/19/21  Yes Vearl Aitken, Ines Bloomer, MD  cetirizine (ZYRTEC) 10 MG tablet Take 1 tablet (10 mg total) by mouth at bedtime. 01/29/21  Yes Zaylia Riolo, Ines Bloomer, MD  escitalopram (LEXAPRO) 20 MG tablet Take 1 tablet (20 mg total) by mouth daily. 07/29/21 10/30/21 Yes Ravin Bendall, Ines Bloomer, MD  fluconazole (DIFLUCAN) 150 MG tablet Take by mouth. 09/23/21  Yes [provider]  Fluticasone-Umeclidin-Vilant (TRELEGY ELLIPTA) 100-62.5-25 MCG/ACT AEPB Inhale 1 puff into the lungs daily. 07/16/21  Yes Plotnikov, Evie Lacks, MD  montelukast (SINGULAIR) 10 MG tablet Take 1 tablet (10 mg total) by mouth at bedtime. 01/29/21  Yes Kashari Chalmers, Ines Bloomer, MD  Multiple Vitamins-Minerals (MULTIVITAMIN WOMEN 50+ PO) Take by mouth.   Yes [provider]  Olopatadine HCl (PATADAY) 0.2 % SOLN Apply 1 drop to eye daily. 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   Vitamin D deficiency 11/11/2014   Essential hypertension 11/21/2011    Past Medical History:  Diagnosis Date   Allergy    Anemia    yrs ago with pregnancy 29 yrs ago    Anxiety    Hypertension    Mass    left buttock    Past Surgical History:  Procedure Laterality Date   BREAST BIOPSY Left 2017   MASS EXCISION Left 01/17/2016   Procedure: EXCISION BIOPSY OF MASS LEFT BUTTOCK ;  Surgeon: Leighton Ruff, MD;  Location: [facility];  Service: General;  Laterality: Left;   TUBAL LIGATION Bilateral 10/30/2003   w/ Bilateral Salpingectomy   WISDOM TOOTH EXTRACTION  2017   lft side     Social History   Socioeconomic History   Marital status: Married    Spouse name: Not on file   Number of children: Not on file   Years of education: Not on file   Highest education level: Not on file  Occupational History   Not on file  Tobacco Use   Smoking status: Never   Smokeless tobacco: Never  Vaping Use   Vaping Use: Never used  Substance and Sexual Activity   Alcohol use: Yes    Comment: occasional   Drug use: No   Sexual activity: Not on file  Other Topics Concern   Not on file  Social History Narrative   Married   Cytogeneticist / CNA   Social Determinants of Health   Financial Resource Strain: Not on file  Food Insecurity: Not on file  Transportation Needs: Not on file  Physical Activity: Not on file  Stress: Not on file  Social Connections: Not on file  Intimate Partner Violence: Not on file    Family History  Problem Relation Age of Onset   Diabetes Mother    Heart disease Mother     Hypertension Mother    Stroke Mother    Cancer Father    Hypertension Father    Stroke Sister    Colon polyps Maternal Uncle    Colon cancer Neg Hx    Esophageal cancer Neg Hx    Rectal cancer Neg Hx    Stomach cancer Neg Hx      Review of Systems  Constitutional: Negative.  Negative for chills and fever.  HENT: Negative.  Negative for congestion and sore throat.   Respiratory: Negative.  Negative for cough and shortness of breath.   Cardiovascular: Negative.  Negative for chest pain and palpitations.  Gastrointestinal: Negative.  Negative for abdominal pain, diarrhea, nausea and vomiting.  Genitourinary: Negative.  Negative for dysuria.  Musculoskeletal: Negative.   Skin: Negative.  Negative for rash.  Neurological: Negative.  Negative for dizziness and headaches.  All other systems reviewed and are negative.  Today's Vitals   10/30/21 1016  BP: 122/80  Pulse: 78  Temp: 98.3 F (36.8 C)  TempSrc: Oral  SpO2: 99%  Weight: 196 lb (88.9 kg)  Height: 5' 2"  (1.575 m)   Body mass index is 35.85 kg/m.  Physical Exam Vitals reviewed.  Constitutional:      Appearance: Normal appearance.  HENT:     Head: Normocephalic.  Eyes:     Extraocular Movements: Extraocular movements intact.     Pupils: Pupils are equal, round, and reactive to light.  Cardiovascular:     Rate and Rhythm: Normal rate and regular rhythm.     Pulses: Normal pulses.     Heart sounds: Normal heart sounds.  Pulmonary:     Effort: Pulmonary effort is normal.     Breath sounds: Normal breath sounds.  Musculoskeletal:     Cervical back: No tenderness.  Lymphadenopathy:     Cervical: No cervical adenopathy.  Skin:    General: Skin is warm and dry.     Capillary Refill: Capillary refill takes less than 2 seconds.  Neurological:     General: No focal deficit present.     Mental Status: She is alert and oriented to person, place, and time.  Psychiatric:        Mood and Affect: Mood normal.         Behavior: Behavior normal.     ASSESSMENT & PLAN: Problem List Items Addressed This Visit       Cardiovascular and Mediastinum   Essential hypertension - Primary    Well-controlled hypertension.  Continue Exforge 10-320 mg daily. BP Readings from Last 3 Encounters:  10/30/21 122/80  07/29/21 126/86  07/16/21 120/82  Dietary approaches to stop hypertension discussed.       Relevant Orders   Comprehensive  metabolic panel   Hemoglobin A1c   Lipid panel     Other   Situational mixed anxiety and depressive disorder    Stable.  Continue Lexapro 20 mg daily.      Perimenopausal    Needs GYN evaluation.  Referral placed today. Also has history of uterine fibroids.      Relevant Orders   Ambulatory referral to Gynecology   History of uterine fibroid   Relevant Orders   Ambulatory referral to Gynecology   Patient Instructions  Health Maintenance, Female Adopting a healthy lifestyle and getting preventive care are important in promoting health and wellness. Ask your health care provider about: The right schedule for you to have regular tests and exams. Things you can do on your own to prevent diseases and keep yourself healthy. What should I know about diet, weight, and exercise? Eat a healthy diet  Eat a diet that includes plenty of vegetables, fruits, low-fat dairy products, and lean protein. Do not eat a lot of foods that are high in solid fats, added sugars, or sodium. Maintain a healthy weight Body mass index (BMI) is used to identify weight problems. It estimates body fat based on height and weight. Your health care provider can help determine your BMI and help you achieve or maintain a healthy weight. Get regular exercise Get regular exercise. This is one of the most important things you can do for your health. Most adults should: Exercise for at least 150 minutes each week. The exercise should increase your heart rate and make you sweat (moderate-intensity  exercise). Do strengthening exercises at least twice a week. This is in addition to the moderate-intensity exercise. Spend less time sitting. Even light physical activity can be beneficial. Watch cholesterol and blood lipids Have your blood tested for lipids and cholesterol at 47 years of age, then have this test every 5 years. Have your cholesterol levels checked more often if: Your lipid or cholesterol levels are high. You are older than 47 years of age. You are at high risk for heart disease. What should I know about cancer screening? Depending on your health history and family history, you may need to have cancer screening at various ages. This may include screening for: Breast cancer. Cervical cancer. Colorectal cancer. Skin cancer. Lung cancer. What should I know about heart disease, diabetes, and high blood pressure? Blood pressure and heart disease High blood pressure causes heart disease and increases the risk of stroke. This is more likely to develop in people who have high blood pressure readings or are overweight. Have your blood pressure checked: Every 3-5 years if you are 32-26 years of age. Every year if you are 9 years old or older. Diabetes Have regular diabetes screenings. This checks your fasting blood sugar level. Have the screening done: Once every three years after age 4 if you are at a normal weight and have a low risk for diabetes. More often and at a younger age if you are overweight or have a high risk for diabetes. What should I know about preventing infection? Hepatitis B If you have a higher risk for hepatitis B, you should be screened for this virus. Talk with your health care provider to find out if you are at risk for hepatitis B infection. Hepatitis C Testing is recommended for: Everyone born from [birth year] through 1966. Anyone with known risk factors for hepatitis C. Sexually transmitted infections (STIs) Get screened for STIs, including gonorrhea and  chlamydia, if: You are sexually active  and are younger than 47 years of age. You are older than 47 years of age and your health care provider tells you that you are at risk for this type of infection. Your sexual activity has changed since you were last screened, and you are at increased risk for chlamydia or gonorrhea. Ask your health care provider if you are at risk. Ask your health care provider about whether you are at high risk for HIV. Your health care provider may recommend a prescription medicine to help prevent HIV infection. If you choose to take medicine to prevent HIV, you should first get tested for HIV. You should then be tested every 3 months for as long as you are taking the medicine. Pregnancy If you are about to stop having your period (premenopausal) and you may become pregnant, seek counseling before you get pregnant. Take 400 to 800 micrograms (mcg) of folic acid every day if you become pregnant. Ask for birth control (contraception) if you want to prevent pregnancy. Osteoporosis and menopause Osteoporosis is a disease in which the bones lose minerals and strength with aging. This can result in bone fractures. If you are 71 years old or older, or if you are at risk for osteoporosis and fractures, ask your health care provider if you should: Be screened for bone loss. Take a calcium or vitamin D supplement to lower your risk of fractures. Be given hormone replacement therapy (HRT) to treat symptoms of menopause. Follow these instructions at home: Alcohol use Do not drink alcohol if: Your health care provider tells you not to drink. You are pregnant, may be pregnant, or are planning to become pregnant. If you drink alcohol: Limit how much you have to: 0-1 drink a day. Know how much alcohol is in your drink. In the [place] one drink equals one 12 oz bottle of beer (355 mL), one 5 oz glass of wine (148 mL), or one 1 oz glass of hard liquor (44 mL). Lifestyle Do not use any  products that contain nicotine or tobacco. These products include cigarettes, chewing tobacco, and vaping devices, such as e-cigarettes. If you need help quitting, ask your health care provider. Do not use street drugs. Do not share needles. Ask your health care provider for help if you need support or information about quitting drugs. General instructions Schedule regular health, dental, and eye exams. Stay current with your vaccines. Tell your health care provider if: You often feel depressed. You have ever been abused or do not feel safe at home. Summary Adopting a healthy lifestyle and getting preventive care are important in promoting health and wellness. Follow your health care provider's instructions about healthy diet, exercising, and getting tested or screened for diseases. Follow your health care provider's instructions on monitoring your cholesterol and blood pressure. This information is not intended to replace advice given to you by your health care provider. Make sure you discuss any questions you have with your health care provider. Document Revised: 01/21/2021 Document Reviewed: 01/21/2021 Elsevier Patient Education  2022 [place], MD [facility]

## 2021-12-17 ENCOUNTER — Encounter (HOSPITAL_BASED_OUTPATIENT_CLINIC_OR_DEPARTMENT_OTHER): Payer: Self-pay | Admitting: Obstetrics & Gynecology

## 2021-12-17 ENCOUNTER — Ambulatory Visit (HOSPITAL_BASED_OUTPATIENT_CLINIC_OR_DEPARTMENT_OTHER): Payer: 59 | Admitting: Obstetrics & Gynecology

## 2021-12-17 ENCOUNTER — Other Ambulatory Visit (HOSPITAL_COMMUNITY)
Admission: RE | Admit: 2021-12-17 | Discharge: 2021-12-17 | Disposition: A | Discharge status: Home / Self Care | Payer: 59 | Source: Ambulatory Visit | Attending: Obstetrics & Gynecology | Admitting: Obstetrics & Gynecology

## 2021-12-17 VITALS — BP 115/78 | HR 78 | Ht 63.0 in | Wt 196.8 lb

## 2021-12-17 DIAGNOSIS — Z23 Encounter for immunization: Secondary | ICD-10-CM | POA: Diagnosis not present

## 2021-12-17 DIAGNOSIS — Z124 Encounter for screening for malignant neoplasm of cervix: Secondary | ICD-10-CM | POA: Diagnosis present

## 2021-12-17 DIAGNOSIS — Z9851 Tubal ligation status: Secondary | ICD-10-CM

## 2021-12-17 DIAGNOSIS — Z8249 Family history of ischemic heart disease and other diseases of the circulatory system: Secondary | ICD-10-CM | POA: Diagnosis not present

## 2021-12-17 DIAGNOSIS — N946 Dysmenorrhea, unspecified: Secondary | ICD-10-CM | POA: Diagnosis not present

## 2021-12-17 DIAGNOSIS — N921 Excessive and frequent menstruation with irregular cycle: Secondary | ICD-10-CM | POA: Diagnosis not present

## 2021-12-17 NOTE — Progress Notes (Signed)
GYNECOLOGY  VISIT ? ?CC:   consult regarding bleeding/pain with cycles ? ?HPI: ?46 y.o. G8P4 Married Pamela Allen here for consult in referral from Dr. Mitchel Honour.  Pt has hx of menorrhagia and dysmenorrhea for many years.  She has hx of ultrasound in 2019 showing a fibroid with thickened endometrium.  She did have a pap smear in 2019 and this was normal.  No hx of abnormal pap smear.   ? ?Typically, cycles are regular but she skipped January and then she had two cycles in March.  Bleeding typically lasts 4-5 days.  All but one day is heavy.  She passes clots.  She wears pads and uses overnights even during the days.  She is using the menstrual underwear to keep from soiling clothes.  On the worse days, she will wear poise pads to prevent accidents.   ? ?She has had anemia in the past.   ? ?GYNECOLOGIC HISTORY: ?Patient's last menstrual period was 12/12/2021. ?Contraception: BTL ? ?Patient Active Problem List  ? Diagnosis Date Noted  ? Perimenopausal 10/30/2021  ? History of uterine fibroid 10/30/2021  ? Family history of DVT 07/16/2021  ? Situational mixed anxiety and depressive disorder 11/15/2020  ? Iron deficiency 03/02/2018  ? Class 1 obesity due to excess calories with serious comorbidity and body mass index (BMI) of 34.0 to 34.9 in adult 03/02/2018  ? Seasonal allergies 01/02/2018  ? 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 Vitamin D deficiency 11/11/2014  ? Essential hypertension 11/21/2011  ? ? ?Past Medical History:  ?Diagnosis Date  ? Allergy   ? Anemia   ? yrs ago with pregnancy 29 yrs ago   ? Anxiety   ? Hypertension   ? Mass   ? left buttock  ? ? ?Past Surgical History:  ?Procedure Laterality Date  ? BREAST BIOPSY Left 2017  ? MASS EXCISION Left 01/17/2016  ? Procedure: EXCISION BIOPSY OF MASS LEFT BUTTOCK ;  Surgeon: Leighton Ruff, MD;  Location: [facility];  Service: General;  Laterality: Left;  ? TUBAL LIGATION Bilateral 10/30/2003  ? w/ Bilateral Salpingectomy  ?  WISDOM TOOTH EXTRACTION  2017  ? lft side   ? ? ?MEDS:   ?Current Outpatient Medications on File Prior to Visit  ?Medication Sig Dispense Refill  ? ALPRAZolam (XANAX) 0.5 MG tablet TAKE 1 TABLET(0.5 MG) BY MOUTH TWICE DAILY AS NEEDED FOR ANXIETY 20 tablet 1  ? amLODipine-valsartan (EXFORGE) 10-320 MG tablet TAKE 1 TABLET BY MOUTH DAILY 90 tablet 3  ? cetirizine (ZYRTEC) 10 MG tablet Take 1 tablet (10 mg total) by mouth at bedtime. 90 tablet 3  ? Fluticasone-Umeclidin-Vilant (TRELEGY ELLIPTA) 100-62.5-25 MCG/ACT AEPB Inhale 1 puff into the lungs daily. 1 each 11  ? montelukast (SINGULAIR) 10 MG tablet Take 1 tablet (10 mg total) by mouth at bedtime. 90 tablet 3  ? Multiple Vitamins-Minerals (MULTIVITAMIN WOMEN 50+ PO) Take by mouth.    ? Olopatadine HCl (PATADAY) 0.2 % SOLN Apply 1 drop to eye daily. 2.5 mL 5  ? escitalopram (LEXAPRO) 20 MG tablet Take 1 tablet (20 mg total) by mouth daily. 90 tablet 3  ? ?No current facility-administered medications on file prior to visit.  ? ? ?ALLERGIES: Other ? ?Family History  ?Problem Relation Age of Onset  ? Diabetes Mother   ? Heart disease Mother   ? Hypertension Mother   ? Stroke Mother   ? Cancer Father   ? Hypertension Father   ? Stroke Sister   ?  Colon polyps Maternal Uncle   ? Colon cancer Neg Hx   ? Esophageal cancer Neg Hx   ? Rectal cancer Neg Hx   ? Stomach cancer Neg Hx   ? ? ?SH:  married, non smoker ? ?Review of Systems  ?Genitourinary:   ?     Heavy bleeding and cramping  ?All other systems reviewed and are negative. ? ?PHYSICAL EXAMINATION:   ? ?BP 115/78   Pulse 78   Ht 5' 3"  (1.6 m) Comment: reported  Wt 196 lb 12.8 oz (89.3 kg)   LMP 12/12/2021   BMI 34.86 kg/m?     ?General appearance: alert, cooperative and appears stated age ?CV:  Regular rate and rhythm ?Lungs:  clear to auscultation, no wheezes, rales or rhonchi, symmetric air entry ?Abdomen: soft, non-tender; bowel sounds normal; no masses,  no organomegaly ?Lymph:  no inguinal LAD  noted ? ?Pelvic: External genitalia:  no lesions ?             Urethra:  normal appearing urethra with no masses, tenderness or lesions ?             Bartholins and Skenes: normal    ?             Vagina: normal appearing vagina with normal color and discharge, no lesions ?             Cervix: no lesions ?             Bimanual Exam:  Uterus:  normal size, contour, position, consistency, mobility, non-tender ?             Adnexa: no mass, fullness, tenderness ? ?Chaperone, Octaviano Batty, CMA, was present for exam. ? ?Assessment/Plan: ?1. Menorrhagia with irregular cycle ?- pt will return for ultrasound and possible endometrial biopsy. ?- US PELVIS TRANSVAGINAL NON-OB (TV ONLY); Future ? ?2. Dysmenorrhea ? ?3. Cervical cancer screening ?- Cytology - PAP( [place]) ? ?4. Family history of DVT ? ?5. H/O tubal ligation ? ?6.  Vaccine updated ?- Tdap given today ? ?7.  Hypertension ? ?Letter sent to Dr. Mitchel Honour, referring provider ? ? ?

## 2021-12-18 LAB — CYTOLOGY - PAP
Comment: NEGATIVE
Diagnosis: NEGATIVE
High risk HPV: NEGATIVE

## 2021-12-30 ENCOUNTER — Ambulatory Visit (HOSPITAL_BASED_OUTPATIENT_CLINIC_OR_DEPARTMENT_OTHER): Payer: 59 | Admitting: Obstetrics & Gynecology

## 2022-01-01 ENCOUNTER — Other Ambulatory Visit (HOSPITAL_COMMUNITY)
Admission: RE | Admit: 2022-01-01 | Discharge: 2022-01-01 | Disposition: A | Discharge status: Home / Self Care | Payer: 59 | Source: Ambulatory Visit | Attending: Obstetrics & Gynecology | Admitting: Obstetrics & Gynecology

## 2022-01-01 ENCOUNTER — Ambulatory Visit (HOSPITAL_BASED_OUTPATIENT_CLINIC_OR_DEPARTMENT_OTHER): Payer: 59 | Admitting: Obstetrics & Gynecology

## 2022-01-01 ENCOUNTER — Encounter (HOSPITAL_BASED_OUTPATIENT_CLINIC_OR_DEPARTMENT_OTHER): Payer: Self-pay | Admitting: Obstetrics & Gynecology

## 2022-01-01 ENCOUNTER — Ambulatory Visit (INDEPENDENT_AMBULATORY_CARE_PROVIDER_SITE_OTHER): Payer: 59

## 2022-01-01 VITALS — BP 120/75 | HR 67 | Ht 63.0 in | Wt 198.4 lb

## 2022-01-01 DIAGNOSIS — Z9851 Tubal ligation status: Secondary | ICD-10-CM

## 2022-01-01 DIAGNOSIS — R9389 Abnormal findings on diagnostic imaging of other specified body structures: Secondary | ICD-10-CM | POA: Diagnosis not present

## 2022-01-01 DIAGNOSIS — N920 Excessive and frequent menstruation with regular cycle: Secondary | ICD-10-CM | POA: Insufficient documentation

## 2022-01-01 DIAGNOSIS — I10 Essential (primary) hypertension: Secondary | ICD-10-CM | POA: Diagnosis not present

## 2022-01-01 DIAGNOSIS — N950 Postmenopausal bleeding: Secondary | ICD-10-CM

## 2022-01-01 DIAGNOSIS — D251 Intramural leiomyoma of uterus: Secondary | ICD-10-CM

## 2022-01-01 DIAGNOSIS — Z8249 Family history of ischemic heart disease and other diseases of the circulatory system: Secondary | ICD-10-CM

## 2022-01-01 DIAGNOSIS — N946 Dysmenorrhea, unspecified: Secondary | ICD-10-CM

## 2022-01-02 ENCOUNTER — Encounter (HOSPITAL_BASED_OUTPATIENT_CLINIC_OR_DEPARTMENT_OTHER): Payer: Self-pay

## 2022-01-03 ENCOUNTER — Other Ambulatory Visit (HOSPITAL_BASED_OUTPATIENT_CLINIC_OR_DEPARTMENT_OTHER): Payer: Self-pay | Admitting: Obstetrics & Gynecology

## 2022-01-03 LAB — SURGICAL PATHOLOGY

## 2022-01-04 ENCOUNTER — Other Ambulatory Visit (HOSPITAL_BASED_OUTPATIENT_CLINIC_OR_DEPARTMENT_OTHER): Payer: Self-pay | Admitting: Obstetrics & Gynecology

## 2022-01-04 DIAGNOSIS — N920 Excessive and frequent menstruation with regular cycle: Secondary | ICD-10-CM

## 2022-01-04 NOTE — Progress Notes (Signed)
GYNECOLOGY  VISIT ? ?CC:   follow up after ultrasound ? ?HPI: ?46 y.o. G51P4 Married [place] female here for discussion of treatment options for menorrhagia.  Ultrasound today reviewed with pt.  Three fibroids noted, all measuring <3cm.  Options reviewed including progesterone only therapies, IUD, fibroid treatments including RFA and myomectomy, endometrial ablation and hysterectomy.  Pt does not want long recovery and does not want to be on any hormonal therapy.  She is most interested in endometrial ablation.  Procedure, recovery, risks including bleeding, infection, 10% failure rate, uterine perform ation with risks to surrounding structures including bowel, bladder and ureters, DVE/PE discussed.  As well, typically bleeding improvement and amenorrhea rate discussed.  Pt aware she should not have childbearing in the future.  H/o tubal ligation so this is not a concern.  Failure risk with fibroids discussed as well. ? ?Given endometrial thickness today, feel endometrial biopsy is warranted.  Procedure reviewed.  Pt desires to proceed today. ? ?Patient Active Problem List  ? Diagnosis Date Noted  ? Perimenopausal 10/30/2021  ? History of uterine fibroid 10/30/2021  ? Family history of DVT 07/16/2021  ? Situational mixed anxiety and depressive disorder 11/15/2020  ? Iron deficiency 03/02/2018  ? Class 1 obesity due to excess calories with serious comorbidity and body mass index (BMI) of 34.0 to 34.9 in adult 03/02/2018  ? Seasonal allergies 01/02/2018  ? 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 Vitamin D deficiency 11/11/2014  ? Essential hypertension 11/21/2011  ? ? ?Past Medical History:  ?Diagnosis Date  ? Allergy   ? Anemia   ? yrs ago with pregnancy 29 yrs ago   ? Anxiety   ? Breast fibroadenoma   ? Hypertension   ? ? ?Past Surgical History:  ?Procedure Laterality Date  ? BREAST BIOPSY Left 2017  ? MASS EXCISION Left 01/17/2016  ? Procedure: EXCISION BIOPSY OF MASS LEFT BUTTOCK ;  Surgeon:  Leighton Ruff, MD;  Location: [facility];  Service: General;  Laterality: Left;  ? TUBAL LIGATION Bilateral 10/30/2003  ? w/ Bilateral Salpingectomy  ? WISDOM TOOTH EXTRACTION  2017  ? lft side   ? ? ?MEDS:   ?Current Outpatient Medications on File Prior to Visit  ?Medication Sig Dispense Refill  ? ALPRAZolam (XANAX) 0.5 MG tablet TAKE 1 TABLET(0.5 MG) BY MOUTH TWICE DAILY AS NEEDED FOR ANXIETY 20 tablet 1  ? amLODipine-valsartan (EXFORGE) 10-320 MG tablet TAKE 1 TABLET BY MOUTH DAILY 90 tablet 3  ? cetirizine (ZYRTEC) 10 MG tablet Take 1 tablet (10 mg total) by mouth at bedtime. 90 tablet 3  ? Fluticasone-Umeclidin-Vilant (TRELEGY ELLIPTA) 100-62.5-25 MCG/ACT AEPB Inhale 1 puff into the lungs daily. 1 each 11  ? montelukast (SINGULAIR) 10 MG tablet Take 1 tablet (10 mg total) by mouth at bedtime. 90 tablet 3  ? Multiple Vitamins-Minerals (MULTIVITAMIN WOMEN 50+ PO) Take by mouth.    ? Olopatadine HCl (PATADAY) 0.2 % SOLN Apply 1 drop to eye daily. 2.5 mL 5  ? escitalopram (LEXAPRO) 20 MG tablet Take 1 tablet (20 mg total) by mouth daily. 90 tablet 3  ? ?No current facility-administered medications on file prior to visit.  ? ? ?ALLERGIES: Other ? ?Family History  ?Problem Relation Age of Onset  ? Diabetes Mother   ? Heart disease Mother   ? Hypertension Mother   ? Stroke Mother   ? Deep vein thrombosis Mother   ? Cancer Father   ? Hypertension Father   ?  Deep vein thrombosis Sister   ? Colon polyps Maternal Uncle   ? Colon cancer Neg Hx   ? Esophageal cancer Neg Hx   ? Rectal cancer Neg Hx   ? Stomach cancer Neg Hx   ? ? ?SH:  married, non smoker ? ?Review of Systems  ?Genitourinary:   ?     Heavy menstrual bleeding  ? ?PHYSICAL EXAMINATION:   ? ?BP 120/75 (BP Location: Right Arm, Patient Position: Sitting, Cuff Size: Large)   Pulse 67   Ht 5' 3"  (1.6 m) Comment: reported  Wt 198 lb 6.4 oz (90 kg)   LMP 12/12/2021   BMI 35.14 kg/m?     ?General appearance: alert, cooperative and appears stated  age ? ?Endometrial biopsy recommended.  Discussed with patient.  Verbal and written consent obtained.   ?Procedure:  Speculum placed.  Cervix visualized and cleansed with betadine prep.  A single toothed tenaculum was applied to the anterior lip of the cervix.  Endometrial pipelle was advanced through the cervix into the endometrial cavity without difficulty.  Pipelle passed to10cm.  Suction applied and pipelle removed with good tissue sample obtained.  Tenculum removed.  No bleeding noted.  Patient tolerated procedure well. ? ?Chaperone, Octaviano Batty, CMA, was present for exam. ? ?Assessment/Plan: ?1. Menorrhagia with regular cycle ?- Surgical pathology( [facility]/ [facility]) ?- will plan to proceed with scheduled hysteroscopy with endometrial ablation using Novasure device ? ?2. Thickened endometrium ? ?3. Primary hypertension ? ?4. Family history of DVT ? ?5. H/O tubal ligation ? ?6. Intramural uterine fibroid ? ? ? ? ? ?

## 2022-01-06 ENCOUNTER — Encounter (HOSPITAL_BASED_OUTPATIENT_CLINIC_OR_DEPARTMENT_OTHER): Payer: Self-pay | Admitting: Obstetrics & Gynecology

## 2022-01-06 ENCOUNTER — Telehealth (HOSPITAL_BASED_OUTPATIENT_CLINIC_OR_DEPARTMENT_OTHER): Payer: Self-pay | Admitting: Obstetrics & Gynecology

## 2022-01-06 DIAGNOSIS — Z9851 Tubal ligation status: Secondary | ICD-10-CM | POA: Insufficient documentation

## 2022-01-06 DIAGNOSIS — N920 Excessive and frequent menstruation with regular cycle: Secondary | ICD-10-CM | POA: Insufficient documentation

## 2022-01-06 DIAGNOSIS — D251 Intramural leiomyoma of uterus: Secondary | ICD-10-CM | POA: Insufficient documentation

## 2022-01-06 NOTE — Telephone Encounter (Signed)
Patient called and left message that she needs to reschedule her surgery. ?

## 2022-01-07 ENCOUNTER — Telehealth (HOSPITAL_BASED_OUTPATIENT_CLINIC_OR_DEPARTMENT_OTHER): Payer: Self-pay | Admitting: Obstetrics & Gynecology

## 2022-01-07 NOTE — Telephone Encounter (Signed)
Called patient and left a message that Dr Sabra Heck has sent the surgery scheduler a message that you would like to canceled . ?

## 2022-01-08 ENCOUNTER — Encounter (HOSPITAL_BASED_OUTPATIENT_CLINIC_OR_DEPARTMENT_OTHER): Payer: Self-pay

## 2022-02-07 ENCOUNTER — Encounter (HOSPITAL_COMMUNITY)
Admission: RE | Admit: 2022-02-07 | Discharge: 2022-02-07 | Disposition: A | Discharge status: Home / Self Care | Payer: 59 | Source: Ambulatory Visit | Attending: Obstetrics & Gynecology | Admitting: Obstetrics & Gynecology

## 2022-02-07 ENCOUNTER — Other Ambulatory Visit: Payer: Self-pay

## 2022-02-07 ENCOUNTER — Encounter (HOSPITAL_BASED_OUTPATIENT_CLINIC_OR_DEPARTMENT_OTHER): Payer: Self-pay | Admitting: Obstetrics & Gynecology

## 2022-02-07 DIAGNOSIS — Z01812 Encounter for preprocedural laboratory examination: Secondary | ICD-10-CM | POA: Insufficient documentation

## 2022-02-07 DIAGNOSIS — Z01818 Encounter for other preprocedural examination: Secondary | ICD-10-CM

## 2022-02-07 DIAGNOSIS — N920 Excessive and frequent menstruation with regular cycle: Secondary | ICD-10-CM | POA: Diagnosis not present

## 2022-02-07 LAB — CBC
HCT: 35.3 % — ABNORMAL LOW (ref 36.0–46.0)
Hemoglobin: 11.4 g/dL — ABNORMAL LOW (ref 12.0–15.0)
MCH: 30.1 pg (ref 26.0–34.0)
MCHC: 32.3 g/dL (ref 30.0–36.0)
MCV: 93.1 fL (ref 80.0–100.0)
Platelets: 292 10*3/uL (ref 150–400)
RBC: 3.79 MIL/uL — ABNORMAL LOW (ref 3.87–5.11)
RDW: 11.9 % (ref 11.5–15.5)
WBC: 6.2 10*3/uL (ref 4.0–10.5)
nRBC: 0 % (ref 0.0–0.2)

## 2022-02-07 LAB — BASIC METABOLIC PANEL
Anion gap: 5 (ref 5–15)
BUN: 11 mg/dL (ref 6–20)
CO2: 25 mmol/L (ref 22–32)
Calcium: 8.9 mg/dL (ref 8.9–10.3)
Chloride: 110 mmol/L (ref 98–111)
Creatinine, Ser: 1.02 mg/dL — ABNORMAL HIGH (ref 0.44–1.00)
GFR, Estimated: >60 mL/min (ref >60)
Glucose, Bld: 107 mg/dL — ABNORMAL HIGH (ref 70–99)
Potassium: 4.1 mmol/L (ref 3.5–5.1)
Sodium: 140 mmol/L (ref 135–145)

## 2022-02-12 ENCOUNTER — Other Ambulatory Visit: Payer: Self-pay

## 2022-02-12 ENCOUNTER — Ambulatory Visit (HOSPITAL_BASED_OUTPATIENT_CLINIC_OR_DEPARTMENT_OTHER)
Admission: RE | Admit: 2022-02-12 | Discharge: 2022-02-12 | Disposition: A | Discharge status: Home / Self Care | Payer: 59 | Source: Ambulatory Visit | Attending: Obstetrics & Gynecology | Admitting: Obstetrics & Gynecology

## 2022-02-12 ENCOUNTER — Ambulatory Visit (HOSPITAL_BASED_OUTPATIENT_CLINIC_OR_DEPARTMENT_OTHER): Payer: 59 | Admitting: Anesthesiology

## 2022-02-12 ENCOUNTER — Encounter (HOSPITAL_BASED_OUTPATIENT_CLINIC_OR_DEPARTMENT_OTHER): Payer: Self-pay | Admitting: Obstetrics & Gynecology

## 2022-02-12 ENCOUNTER — Encounter (HOSPITAL_BASED_OUTPATIENT_CLINIC_OR_DEPARTMENT_OTHER)
Admission: RE | Disposition: A | Discharge status: Home / Self Care | Payer: Self-pay | Source: Ambulatory Visit | Attending: Obstetrics & Gynecology

## 2022-02-12 DIAGNOSIS — F32A Depression, unspecified: Secondary | ICD-10-CM | POA: Diagnosis not present

## 2022-02-12 DIAGNOSIS — N920 Excessive and frequent menstruation with regular cycle: Secondary | ICD-10-CM

## 2022-02-12 DIAGNOSIS — N840 Polyp of corpus uteri: Secondary | ICD-10-CM | POA: Insufficient documentation

## 2022-02-12 DIAGNOSIS — F419 Anxiety disorder, unspecified: Secondary | ICD-10-CM | POA: Diagnosis not present

## 2022-02-12 DIAGNOSIS — I10 Essential (primary) hypertension: Secondary | ICD-10-CM | POA: Insufficient documentation

## 2022-02-12 DIAGNOSIS — D259 Leiomyoma of uterus, unspecified: Secondary | ICD-10-CM | POA: Diagnosis not present

## 2022-02-12 DIAGNOSIS — Z01818 Encounter for other preprocedural examination: Secondary | ICD-10-CM

## 2022-02-12 HISTORY — PX: DILITATION & CURRETTAGE/HYSTROSCOPY WITH NOVASURE ABLATION: SHX5568

## 2022-02-12 LAB — POCT I-STAT, CHEM 8
BUN: 10 mg/dL (ref 6–20)
Calcium, Ion: 1.3 mmol/L (ref 1.15–1.40)
Chloride: 103 mmol/L (ref 98–111)
Creatinine, Ser: 1.2 mg/dL — ABNORMAL HIGH (ref 0.44–1.00)
Glucose, Bld: 111 mg/dL — ABNORMAL HIGH (ref 70–99)
HCT: 37 % (ref 36.0–46.0)
Hemoglobin: 12.6 g/dL (ref 12.0–15.0)
Potassium: 3.6 mmol/L (ref 3.5–5.1)
Sodium: 139 mmol/L (ref 135–145)
TCO2: 22 mmol/L (ref 22–32)

## 2022-02-12 LAB — POCT PREGNANCY, URINE
Preg Test, Ur: NEGATIVE
Preg Test, Ur: NEGATIVE

## 2022-02-12 SURGERY — DILATATION & CURETTAGE/HYSTEROSCOPY WITH NOVASURE ABLATION
Anesthesia: General | Site: Vagina

## 2022-02-12 MED ORDER — KETOROLAC TROMETHAMINE 30 MG/ML IJ SOLN
INTRAMUSCULAR | Status: AC
  Filled 2022-02-12: qty 1

## 2022-02-12 MED ORDER — ONDANSETRON HCL 4 MG/2ML IJ SOLN
INTRAMUSCULAR | Status: AC
  Filled 2022-02-12: qty 2

## 2022-02-12 MED ORDER — OXYCODONE HCL 5 MG PO TABS
ORAL_TABLET | ORAL | Status: AC
  Filled 2022-02-12: qty 1

## 2022-02-12 MED ORDER — PROPOFOL 10 MG/ML IV BOLUS
INTRAVENOUS | Status: AC
  Filled 2022-02-12: qty 20

## 2022-02-12 MED ORDER — SODIUM CHLORIDE 0.9 % IR SOLN
Status: DC | PRN
  Administered 2022-02-12: 3000 mL

## 2022-02-12 MED ORDER — DEXAMETHASONE SODIUM PHOSPHATE 10 MG/ML IJ SOLN
INTRAMUSCULAR | Status: DC | PRN
  Administered 2022-02-12: 10 mg via INTRAVENOUS

## 2022-02-12 MED ORDER — MIDAZOLAM HCL 5 MG/5ML IJ SOLN
INTRAMUSCULAR | Status: DC | PRN
  Administered 2022-02-12: 2 mg via INTRAVENOUS

## 2022-02-12 MED ORDER — ACETAMINOPHEN 500 MG PO TABS
ORAL_TABLET | ORAL | Status: AC
  Filled 2022-02-12: qty 2

## 2022-02-12 MED ORDER — ACETAMINOPHEN 500 MG PO TABS
1000.0000 mg | ORAL_TABLET | Freq: Once | ORAL | Status: AC
  Administered 2022-02-12: 1000 mg via ORAL

## 2022-02-12 MED ORDER — PROPOFOL 10 MG/ML IV BOLUS
INTRAVENOUS | Status: DC | PRN
  Administered 2022-02-12: 200 mg via INTRAVENOUS

## 2022-02-12 MED ORDER — MIDAZOLAM HCL 2 MG/2ML IJ SOLN
INTRAMUSCULAR | Status: AC
  Filled 2022-02-12: qty 2

## 2022-02-12 MED ORDER — LIDOCAINE HCL (PF) 2 % IJ SOLN
INTRAMUSCULAR | Status: AC
  Filled 2022-02-12: qty 5

## 2022-02-12 MED ORDER — KETOROLAC TROMETHAMINE 30 MG/ML IJ SOLN
INTRAMUSCULAR | Status: DC | PRN
  Administered 2022-02-12: 30 mg via INTRAVENOUS

## 2022-02-12 MED ORDER — OXYCODONE HCL 5 MG PO TABS
5.0000 mg | ORAL_TABLET | Freq: Once | ORAL | Status: AC
  Administered 2022-02-12: 5 mg via ORAL

## 2022-02-12 MED ORDER — HYDROCODONE-ACETAMINOPHEN 5-325 MG PO TABS: 1.0000 | ORAL_TABLET | Freq: Four times a day (QID) | ORAL | 0 refills | Status: DC | PRN

## 2022-02-12 MED ORDER — LIDOCAINE 2% (20 MG/ML) 5 ML SYRINGE
INTRAMUSCULAR | Status: DC | PRN
  Administered 2022-02-12: 60 mg via INTRAVENOUS

## 2022-02-12 MED ORDER — POVIDONE-IODINE 10 % EX SWAB: 2.0000 "application " | Freq: Once | CUTANEOUS | Status: DC

## 2022-02-12 MED ORDER — PHENYLEPHRINE 80 MCG/ML (10ML) SYRINGE FOR IV PUSH (FOR BLOOD PRESSURE SUPPORT)
PREFILLED_SYRINGE | INTRAVENOUS | Status: DC | PRN
  Administered 2022-02-12 (×3): 80 ug via INTRAVENOUS

## 2022-02-12 MED ORDER — FENTANYL CITRATE (PF) 100 MCG/2ML IJ SOLN: 25.0000 ug | INTRAMUSCULAR | Status: DC | PRN

## 2022-02-12 MED ORDER — IBUPROFEN 800 MG PO TABS: 800.0000 mg | ORAL_TABLET | Freq: Three times a day (TID) | ORAL | 0 refills | Status: DC | PRN

## 2022-02-12 MED ORDER — LACTATED RINGERS IV SOLN: INTRAVENOUS | Status: DC

## 2022-02-12 MED ORDER — GLYCOPYRROLATE 0.2 MG/ML IJ SOLN
INTRAMUSCULAR | Status: DC | PRN
  Administered 2022-02-12: .1 mg via INTRAVENOUS

## 2022-02-12 MED ORDER — FENTANYL CITRATE (PF) 100 MCG/2ML IJ SOLN
INTRAMUSCULAR | Status: AC
  Filled 2022-02-12: qty 2

## 2022-02-12 MED ORDER — ONDANSETRON HCL 4 MG/2ML IJ SOLN
INTRAMUSCULAR | Status: DC | PRN
  Administered 2022-02-12: 4 mg via INTRAVENOUS

## 2022-02-12 MED ORDER — FENTANYL CITRATE (PF) 100 MCG/2ML IJ SOLN
INTRAMUSCULAR | Status: DC | PRN
  Administered 2022-02-12 (×2): 50 ug via INTRAVENOUS

## 2022-02-12 MED ORDER — BUPIVACAINE LIPOSOME 1.3 % IJ SUSP
INTRAMUSCULAR | Status: AC
  Filled 2022-02-12: qty 20

## 2022-02-12 MED ORDER — LIDOCAINE-EPINEPHRINE 1 %-1:100000 IJ SOLN
INTRAMUSCULAR | Status: DC | PRN
  Administered 2022-02-12: 10 mL

## 2022-02-12 SURGICAL SUPPLY — 17 items
ABLATOR SURESOUND NOVASURE (ABLATOR) ×1 IMPLANT
DEVICE MYOSURE LITE (MISCELLANEOUS) ×1 IMPLANT
DRSG TELFA 3X8 NADH (GAUZE/BANDAGES/DRESSINGS) ×2 IMPLANT
GLOVE BIOGEL PI IND STRL 7.0 (GLOVE) ×4 IMPLANT
GLOVE BIOGEL PI INDICATOR 7.0 (GLOVE) ×5
GLOVE ECLIPSE 6.5 STRL STRAW (GLOVE) ×3 IMPLANT
GOWN STRL REUS W/TWL LRG LVL3 (GOWN DISPOSABLE) ×7 IMPLANT
GOWN STRL REUS W/TWL XL LVL3 (GOWN DISPOSABLE) ×1 IMPLANT
IV NS IRRIG 3000ML ARTHROMATIC (IV SOLUTION) ×3 IMPLANT
KIT PROCEDURE FLUENT (KITS) ×3 IMPLANT
KIT TURNOVER CYSTO (KITS) ×3 IMPLANT
PACK VAGINAL MINOR WOMEN LF (CUSTOM PROCEDURE TRAY) ×3 IMPLANT
PAD DRESSING TELFA 3X8 NADH (GAUZE/BANDAGES/DRESSINGS) ×2 IMPLANT
PAD OB MATERNITY 4.3X12.25 (PERSONAL CARE ITEMS) ×3 IMPLANT
SEAL ROD LENS SCOPE MYOSURE (ABLATOR) ×3 IMPLANT
SPIKE FLUID TRANSFER (MISCELLANEOUS) ×1 IMPLANT
TOWEL OR 17X26 10 PK STRL BLUE (TOWEL DISPOSABLE) ×5 IMPLANT

## 2022-02-12 NOTE — H&P (Signed)
Pamela Allen is an 46 y.o. female G20P4 AA female here for treatment of menorrhagia due to uterine fibroids.  She has undergone recent ultrasound showing three fibroids.  All are under 3 cm.  Options for treatment discussed.  She does not want to be on hormonal therapy and does not a long recovery so has decided to proceed with endometrial ablation.  Hysteroscopy will be performed at the same time.  Risks, benefits, alternatives have been discussed and are documented in prior office note.  Pt has also had an endometrial biopsy that was benign on 01/01/2022.  Pertinent Gynecological History: Menses:  regular Bleeding: heavy Contraception:  BLT DES exposure: denies Blood transfusions: none Sexually transmitted diseases: no past history Previous GYN Procedures:  tubal ligation   Last mammogram: normal Date: 06/2021 Last pap: normal Date: 12/17/2021 OB History: G4, P4   Menstrual History: Patient's last menstrual period was 02/05/2022 (exact date).    Past Medical History:  Diagnosis Date   Allergy    Anemia    yrs ago with pregnancy 29 yrs ago    Anxiety    Breast fibroadenoma    Hypertension     Past Surgical History:  Procedure Laterality Date   BREAST BIOPSY Left 2017   MASS EXCISION Left 01/17/2016   Procedure: EXCISION BIOPSY OF MASS LEFT BUTTOCK ;  Surgeon: Leighton Ruff, MD;  Location: [facility];  Service: General;  Laterality: Left;   TUBAL LIGATION Bilateral 10/30/2003   w/ Bilateral Salpingectomy   WISDOM TOOTH EXTRACTION  2017   lft side     Family History  Problem Relation Age of Onset   Diabetes Mother    Heart disease Mother    Hypertension Mother    Stroke Mother    Deep vein thrombosis Mother    Cancer Father    Hypertension Father    Deep vein thrombosis Sister    Colon polyps Maternal Uncle    Colon cancer Neg Hx    Esophageal cancer Neg Hx    Rectal cancer Neg Hx    Stomach cancer Neg Hx     Social History:  reports that she has  never smoked. She has never used smokeless tobacco. She reports current alcohol use. She reports that she does not use drugs.  Allergies:  Allergies  Allergen Reactions   Other Hives    Neoprene in rubber    Medications Prior to Admission  Medication Sig Dispense Refill Last Dose   amLODipine-valsartan (EXFORGE) 10-320 MG tablet TAKE 1 TABLET BY MOUTH DAILY 90 tablet 3 02/11/2022   cetirizine (ZYRTEC) 10 MG tablet Take 1 tablet (10 mg total) by mouth at bedtime. 90 tablet 3 02/11/2022   escitalopram (LEXAPRO) 20 MG tablet Take 1 tablet (20 mg total) by mouth daily. 90 tablet 3 Past Month   montelukast (SINGULAIR) 10 MG tablet Take 1 tablet (10 mg total) by mouth at bedtime. 90 tablet 3 02/11/2022   Multiple Vitamins-Minerals (MULTIVITAMIN WOMEN 50+ PO) Take by mouth.   02/11/2022   Olopatadine HCl (PATADAY) 0.2 % SOLN Apply 1 drop to eye daily. 2.5 mL 5 Past Week   ALPRAZolam (XANAX) 0.5 MG tablet TAKE 1 TABLET(0.5 MG) BY MOUTH TWICE DAILY AS NEEDED FOR ANXIETY 20 tablet 1 More than a month   Fluticasone-Umeclidin-Vilant (TRELEGY ELLIPTA) 100-62.5-25 MCG/ACT AEPB Inhale 1 puff into the lungs daily. 1 each 11 More than a month    Review of Systems  All other systems reviewed and are negative.  Blood pressure 125/89, pulse 92, temperature (!) 97.2 F (36.2 C), temperature source Oral, resp. rate 16, height 5' 3"  (1.6 m), weight 89.3 kg, last menstrual period 02/05/2022, SpO2 99 %. Physical Exam Constitutional:      Appearance: Normal appearance.  Cardiovascular:     Rate and Rhythm: Normal rate and regular rhythm.  Pulmonary:     Effort: Pulmonary effort is normal.     Breath sounds: Normal breath sounds.  Neurological:     General: No focal deficit present.     Mental Status: She is alert and oriented to person, place, and time.  Psychiatric:        Mood and Affect: Mood normal.    Results for orders placed or performed during the hospital encounter of 02/12/22 (from the past 24  hour(s))  Pregnancy, urine POC     Status: None   Collection Time: 02/12/22  6:38 AM  Result Value Ref Range   Preg Test, Ur NEGATIVE NEGATIVE  Pregnancy, urine POC     Status: None   Collection Time: 02/12/22  6:40 AM  Result Value Ref Range   Preg Test, Ur NEGATIVE NEGATIVE  I-STAT, chem 8     Status: Abnormal   Collection Time: 02/12/22  7:06 AM  Result Value Ref Range   Sodium 139 135 - 145 mmol/L   Potassium 3.6 3.5 - 5.1 mmol/L   Chloride 103 98 - 111 mmol/L   BUN 10 6 - 20 mg/dL   Creatinine, Ser 1.20 (H) 0.44 - 1.00 mg/dL   Glucose, Bld 111 (H) 70 - 99 mg/dL   Calcium, Ion 1.30 1.15 - 1.40 mmol/L   TCO2 22 22 - 32 mmol/L   Hemoglobin 12.6 12.0 - 15.0 g/dL   HCT 37.0 36.0 - 46.0 %    No results found.  Assessment/Plan: 46 yo G38P4 AA female with menorrhagia, h/o fibroids here for surgical treatment of bleeding with hysteroscopy and endometrial ablation.  Procedure reviewed.  Pt ready to proceed.  Megan Salon 02/12/2022, 8:28 AM

## 2022-02-12 NOTE — Anesthesia Postprocedure Evaluation (Signed)
Anesthesia Post Note  Patient: Pamela Allen  Procedure(s) Performed: Andy Gauss RESECTION WITH MYOSURE, NOVASURE ABLATION (Vagina )     Patient location during evaluation: PACU Anesthesia Type: General Level of consciousness: awake and alert Pain management: pain level controlled Vital Signs Assessment: post-procedure vital signs reviewed and stable Respiratory status: spontaneous breathing, nonlabored ventilation, respiratory function stable and patient connected to nasal cannula oxygen Cardiovascular status: blood pressure returned to baseline and stable Postop Assessment: no apparent nausea or vomiting Anesthetic complications: no   No notable events documented.  Last Vitals:  Vitals:   02/12/22 1030 02/12/22 1200  BP: 115/66 131/88  Pulse: 67 73  Resp: 15 15  Temp:  36.7 C  SpO2: 94% 99%    Last Pain:  Vitals:   02/12/22 1200  TempSrc:   PainSc: 6                  Jonai Weyland L Earnest Mcgillis

## 2022-02-12 NOTE — Transfer of Care (Signed)
Immediate Anesthesia Transfer of Care Note  Patient: Pamela Allen  Procedure(s) Performed: Andy Gauss RESECTION WITH MYOSURE, NOVASURE ABLATION (Vagina )  Patient Location: PACU  Anesthesia Type:General  Level of Consciousness: drowsy and patient cooperative  Airway & Oxygen Therapy: Patient Spontanous Breathing and Patient connected to face mask oxygen  Post-op Assessment: Report given to RN and Post -op Vital signs reviewed and stable  Post vital signs: Reviewed and stable  Last Vitals:  Vitals Value Taken Time  BP 103/57 02/12/22 0916  Temp 36.4 C 02/12/22 0917  Pulse 89 02/12/22 0916  Resp    SpO2 97 % 02/12/22 0916  Vitals shown include unvalidated device data.  Last Pain:  Vitals:   02/12/22 0652  TempSrc: Oral  PainSc: 0-No pain      Patients Stated Pain Goal: 5 (17/79/39 0300)  Complications: No notable events documented.

## 2022-02-12 NOTE — Anesthesia Preprocedure Evaluation (Addendum)
Anesthesia Evaluation  Patient identified by MRN, date of birth, ID band Patient awake    Reviewed: Allergy & Precautions, NPO status , Patient's Chart, lab work & pertinent test results  Airway Mallampati: II  TM Distance: >3 FB Neck ROM: Full    Dental no notable dental hx. (+)    Pulmonary neg pulmonary ROS,    Pulmonary exam normal breath sounds clear to auscultation       Cardiovascular hypertension, Pt. on medications Normal cardiovascular exam Rhythm:Regular Rate:Normal     Neuro/Psych PSYCHIATRIC DISORDERS Anxiety Depression negative neurological ROS     GI/Hepatic negative GI ROS, Neg liver ROS,   Endo/Other  negative endocrine ROS  Renal/GU negative Renal ROS  negative genitourinary   Musculoskeletal negative musculoskeletal ROS (+)   Abdominal   Peds  Hematology negative hematology ROS (+)   Anesthesia Other Findings   Reproductive/Obstetrics                            Anesthesia Physical Anesthesia Plan  ASA: 2  Anesthesia Plan: General   Post-op Pain Management: Tylenol PO (pre-op)*   Induction: Intravenous  PONV Risk Score and Plan: 3 and Ondansetron, Dexamethasone and Midazolam  Airway Management Planned: LMA  Additional Equipment:   Intra-op Plan:   Post-operative Plan: Extubation in OR  Informed Consent: I have reviewed the patients History and Physical, chart, labs and discussed the procedure including the risks, benefits and alternatives for the proposed anesthesia with the patient or authorized representative who has indicated his/her understanding and acceptance.     Dental advisory given  Plan Discussed with: CRNA  Anesthesia Plan Comments:         Anesthesia Quick Evaluation

## 2022-02-12 NOTE — Anesthesia Procedure Notes (Signed)
Procedure Name: LMA Insertion Date/Time: 02/12/2022 8:40 AM Performed by: Gwyndolyn Saxon, CRNA Pre-anesthesia Checklist: Patient identified, Emergency Drugs available, Suction available and Patient being monitored Patient Re-evaluated:Patient Re-evaluated prior to induction Oxygen Delivery Method: Circle system utilized Preoxygenation: Pre-oxygenation with 100% oxygen Induction Type: IV induction Ventilation: Mask ventilation without difficulty LMA: LMA inserted LMA Size: 4.0 Number of attempts: 1 Placement Confirmation: positive ETCO2 and breath sounds checked- equal and bilateral Tube secured with: Tape Dental Injury: Teeth and Oropharynx as per pre-operative assessment

## 2022-02-12 NOTE — Brief Op Note (Signed)
02/12/2022  9:03 AM  PATIENT:  Pamela Allen  46 y.o. female  PRE-OPERATIVE DIAGNOSIS:  Menorrhagia, fibroids <3cm  POST-OPERATIVE DIAGNOSIS:  Menorrhagia, fibroids <3cm, endometriral polyp  PROCEDURE:  Procedure(s): /HYSTEROSCOPY, POLYP RESECTION WITH MYOSURE, NOVASURE ABLATION  SURGEON:  Megan Salon  ASSISTANTS: OR staff.    ANESTHESIA:   general  ESTIMATED BLOOD LOSS: 10 mL  BLOOD ADMINISTERED:none   FLUIDS: 400cc LR  UOP: pt voided before going back to the operating room  SPECIMEN:  endometrial polyp  DISPOSITION OF SPECIMEN:  PATHOLOGY  FINDINGS: three small polyps within the endometrial cavity, otherwise, normal appearing endometrial cavity  DESCRIPTION OF OPERATION: Patient was taken to the operating room.  She is placed in the supine position. SCDs were on her lower extremities and functioning properly. General anesthesia with an LMA was administered without difficulty. Dr. Lanetta Inch, anesthesia, oversaw case.  Time out performed.  Legs were then placed in the [place] in the low lithotomy position. The legs were lifted to the high lithotomy position and the Betadine prep was used on the inner thighs perineum and vagina x3. Patient was draped in a normal standard fashion.  A bivalve speculum was placed the vagina. The anterior lip of the cervix was grasped with single-tooth tenaculum.  A paracervical block of 1% lidocaine mixed one-to-one with epinephrine (1:100,000 units).  10 cc was used total. The cervix is dilated up to #21 [facility] dilators. The endometrial cavity sounded to 11 cm.   A 2.9 millimeter diagnostic hysteroscope was obtained. Normal saline was used as a hysteroscopic fluid. The hysteroscope was advanced through the endocervical canal into the endometrial cavity. The tubal ostia were noted bilaterally. Additional findings included three small polyps.  Then a Myosure lite device was obtained and the polyps were fully resected and sent the pathology.  The  hysteroscopy was then removed.  The Novasure device was obtained.  The endometrial cavity was 5.5cm.  Novasure device was passed through the cervical canal and into the endometrial cavity.  The array was opened.  The device was seated.  The cavity length was 4.0.  Power setting was 121w.  Seal placed over the cervical os.  Cavity assessment test was performed and passed without difficulty.  The ablation cycle was started and lasted 120 seconds.  Once the ablation was completed, the novasure array was collapsed and the device removed. With revisualization of the hysteroscope, the endometrium appeared ot have adequate blanching consistent with good ablation cycle.  At this point no other procedure was needed and this procedure was ended. The hysteroscope was removed. The fluid deficit was 150 cc. The tenaculum was removed from the anterior lip of the cervix. The speculum was removed from the vagina. The prep was cleansed of the patient's skin. The legs are positioned back in the supine position. Sponge, lap, needle, initially counts were correct x2. Patient was taken to recovery in stable condition.   COUNTS:  YES  PLAN OF CARE: Transfer to PACU

## 2022-02-12 NOTE — Discharge Instructions (Addendum)
Post-surgical Instructions, Outpatient Surgery  You may expect to feel dizzy, weak, and drowsy for as long as 24 hours after receiving the medicine that made you sleep (anesthetic). For the first 24 hours after your surgery:   Do not drive a car, ride a bicycle, participate in physical activities, or take public transportation until you are done taking narcotic pain medicines or as directed by Dr. Sabra Heck.  Do not drink alcohol or take tranquilizers.  Do not take medicine that has not been prescribed by your physicians.  Do not sign important papers or make important decisions while on narcotic pain medicines.  Have a responsible person with you.   PAIN MANAGEMENT Motrin 886m.  (This is the same as 4-2075mover the counter tablets of Motrin or ibuprofen.)  You may take this every eight hours or as needed for cramping.   Vicodin 5/32564m For more severe pain, take one or two tablets every four to six hours as needed for pain control.  (Remember that narcotic pain medications increase your risk of constipation.  If this becomes a problem, you may take an over the counter stool softener like Colace 100m85m to four times a day.)  DO'S AND DON'T'S Do not take a tub bath for one week.  You may shower on the first day after your surgery Do not do any heavy lifting for one to two weeks.  This increases the chance of bleeding. Do move around as you feel able.  Stairs are fine.  You may begin to exercise again as you feel able.  Do not lift any weights for two weeks. Do not put anything in the vagina for two weeks--no tampons, intercourse, or douching.    REGULAR MEDIATIONS/VITAMINS: You may restart all of your regular medications as prescribed. You may restart all of your vitamins as you normally take them.    PLEASE CALL OR SEEK MEDICAL CARE IF: You have persistent nausea and vomiting.  You have trouble eating or drinking.  You have an oral temperature above 100.5.  You have constipation that is  not helped by adjusting diet or increasing fluid intake. Pain medicines are a common cause of constipation.  You have heavy vaginal bleeding   Post Anesthesia Home Care Instructions  Activity: Get plenty of rest for the remainder of the day. A responsible individual must stay with you for 24 hours following the procedure.  For the next 24 hours, DO NOT: -Drive a car -OperPaediatric nurseink alcoholic beverages -Take any medication unless instructed by your physician -Make any legal decisions or sign important papers.  Meals: Start with liquid foods such as gelatin or soup. Progress to regular foods as tolerated. Avoid greasy, spicy, heavy foods. If nausea and/or vomiting occur, drink only clear liquids until the nausea and/or vomiting subsides. Call your physician if vomiting continues.  Special Instructions/Symptoms: Your throat may feel dry or sore from the anesthesia or the breathing tube placed in your throat during surgery. If this causes discomfort, gargle with warm salt water. The discomfort should disappear within 24 hours.

## 2022-02-12 NOTE — Op Note (Signed)
PATIENT:  Pamela Allen  46 y.o. female  PRE-OPERATIVE DIAGNOSIS:  Menorrhagia, fibroids <3cm  POST-OPERATIVE DIAGNOSIS:  Menorrhagia, fibroids <3cm, endometriral polyp  PROCEDURE:  Procedure(s): /HYSTEROSCOPY, POLYP RESECTION WITH MYOSURE, NOVASURE ABLATION  SURGEON:  Megan Salon  ASSISTANTS: OR staff.    ANESTHESIA:   general  ESTIMATED BLOOD LOSS: 10 mL  BLOOD ADMINISTERED:none   FLUIDS: 400cc LR  UOP: pt voided before going back to the operating room  SPECIMEN:  endometrial polyp  DISPOSITION OF SPECIMEN:  PATHOLOGY  FINDINGS: three small polyps within the endometrial cavity, otherwise, normal appearing endometrial cavity  DESCRIPTION OF OPERATION: Patient was taken to the operating room.  She is placed in the supine position. SCDs were on her lower extremities and functioning properly. General anesthesia with an LMA was administered without difficulty. Dr. Lanetta Inch, anesthesia, oversaw case.  Time out performed.  Legs were then placed in the [place] in the low lithotomy position. The legs were lifted to the high lithotomy position and the Betadine prep was used on the inner thighs perineum and vagina x3. Patient was draped in a normal standard fashion.  A bivalve speculum was placed the vagina. The anterior lip of the cervix was grasped with single-tooth tenaculum.  A paracervical block of 1% lidocaine mixed one-to-one with epinephrine (1:100,000 units).  10 cc was used total. The cervix is dilated up to #21 [facility] dilators. The endometrial cavity sounded to 11 cm.   A 2.9 millimeter diagnostic hysteroscope was obtained. Normal saline was used as a hysteroscopic fluid. The hysteroscope was advanced through the endocervical canal into the endometrial cavity. The tubal ostia were noted bilaterally. Additional findings included three small polyps.  Then a Myosure lite device was obtained and the polyps were fully resected and sent the pathology.  The hysteroscopy was then  removed.  The Novasure device was obtained.  The endometrial cavity was 5.5cm.  Novasure device was passed through the cervical canal and into the endometrial cavity.  The array was opened.  The device was seated.  The cavity length was 4.0.  Power setting was 121w.  Seal placed over the cervical os.  Cavity assessment test was performed and passed without difficulty.  The ablation cycle was started and lasted 120 seconds.  Once the ablation was completed, the novasure array was collapsed and the device removed. With revisualization of the hysteroscope, the endometrium appeared ot have adequate blanching consistent with good ablation cycle.  At this point no other procedure was needed and this procedure was ended. The hysteroscope was removed. The fluid deficit was 150 cc. The tenaculum was removed from the anterior lip of the cervix. The speculum was removed from the vagina. The prep was cleansed of the patient's skin. The legs are positioned back in the supine position. Sponge, lap, needle, initially counts were correct x2. Patient was taken to recovery in stable condition.   COUNTS:  YES  PLAN OF CARE: Transfer to PACU

## 2022-02-13 ENCOUNTER — Encounter (HOSPITAL_BASED_OUTPATIENT_CLINIC_OR_DEPARTMENT_OTHER): Payer: Self-pay | Admitting: Obstetrics & Gynecology

## 2022-02-13 LAB — SURGICAL PATHOLOGY

## 2022-02-26 ENCOUNTER — Encounter (HOSPITAL_BASED_OUTPATIENT_CLINIC_OR_DEPARTMENT_OTHER): Payer: Self-pay

## 2022-02-26 ENCOUNTER — Other Ambulatory Visit (HOSPITAL_BASED_OUTPATIENT_CLINIC_OR_DEPARTMENT_OTHER): Payer: 59

## 2022-02-26 ENCOUNTER — Ambulatory Visit (HOSPITAL_BASED_OUTPATIENT_CLINIC_OR_DEPARTMENT_OTHER): Payer: 59 | Admitting: Obstetrics & Gynecology

## 2022-03-10 ENCOUNTER — Other Ambulatory Visit: Payer: Self-pay | Admitting: Emergency Medicine

## 2022-03-10 DIAGNOSIS — J302 Other seasonal allergic rhinitis: Secondary | ICD-10-CM

## 2022-03-14 ENCOUNTER — Ambulatory Visit (INDEPENDENT_AMBULATORY_CARE_PROVIDER_SITE_OTHER): Payer: 59 | Admitting: Obstetrics & Gynecology

## 2022-03-14 VITALS — Ht 63.0 in | Wt 193.8 lb

## 2022-03-14 DIAGNOSIS — Z9889 Other specified postprocedural states: Secondary | ICD-10-CM

## 2022-03-14 DIAGNOSIS — N9489 Other specified conditions associated with female genital organs and menstrual cycle: Secondary | ICD-10-CM

## 2022-03-14 DIAGNOSIS — Z8742 Personal history of other diseases of the female genital tract: Secondary | ICD-10-CM

## 2022-03-14 DIAGNOSIS — D251 Intramural leiomyoma of uterus: Secondary | ICD-10-CM

## 2022-03-14 MED ORDER — IBUPROFEN 800 MG PO TABS: 800.0000 mg | ORAL_TABLET | Freq: Three times a day (TID) | ORAL | 2 refills | Status: AC | PRN

## 2022-03-14 NOTE — Progress Notes (Unsigned)
GYNECOLOGY  VISIT  CC:   post op recheck  HPI: 46 y.o. G43P4 Married Pamela Allen or [place] American female here for recheck after undergoing hysteroscopy, polyp resection, endometrial ablation on 02/12/2022.  She reports bleeding is minimal.  She has some mild cramping still.  Taking ibuprofen.  Bowel function is  still a little .  Bladder function is normal.    Pathology reviewed:  Yes .  Questions answered.    MEDS:   Current Outpatient Medications on File Prior to Visit  Medication Sig Dispense Refill   ALPRAZolam (XANAX) 0.5 MG tablet TAKE 1 TABLET(0.5 MG) BY MOUTH TWICE DAILY AS NEEDED FOR ANXIETY 20 tablet 1   amLODipine-valsartan (EXFORGE) 10-320 MG tablet TAKE 1 TABLET BY MOUTH DAILY 90 tablet 3   cetirizine (ZYRTEC) 10 MG tablet TAKE 1 TABLET(10 MG) BY MOUTH AT BEDTIME 90 tablet 3   Fluticasone-Umeclidin-Vilant (TRELEGY ELLIPTA) 100-62.5-25 MCG/ACT AEPB Inhale 1 puff into the lungs daily. 1 each 11   montelukast (SINGULAIR) 10 MG tablet Take 1 tablet (10 mg total) by mouth at bedtime. 90 tablet 3   Multiple Vitamins-Minerals (MULTIVITAMIN WOMEN 50+ PO) Take by mouth.     Olopatadine HCl (PATADAY) 0.2 % SOLN Apply 1 drop to eye daily. 2.5 mL 5   escitalopram (LEXAPRO) 20 MG tablet Take 1 tablet (20 mg total) by mouth daily. 90 tablet 3   No current facility-administered medications on file prior to visit.    SH:  Smoking No    PHYSICAL EXAMINATION:   Ht 5' 3"  (1.6 m)   Wt 193 lb 12.8 oz (87.9 kg)   BMI 34.33 kg/m     General appearance: alert, cooperative and appears stated age CV:  Regular rate and rhythm Lungs:  clear to auscultation, no wheezes, rales or rhonchi, symmetric air entry Abdomen: soft, non-tender; bowel sounds normal; no masses,  no organomegaly  Assessment/Plan: 1. Post-operative state - doing well except having some cramping - recommended recheck 3-4 months  2. Uterine cramping - ibuprofen (ADVIL) 800 MG tablet; Take 1 tablet (800 mg total) by mouth every  8 (eight) hours as needed.  Dispense: 30 tablet; Refill: 2  3. Intramural uterine fibroid  4. History of menorrhagia  5. History of endometrial ablation

## 2022-03-17 ENCOUNTER — Encounter (HOSPITAL_BASED_OUTPATIENT_CLINIC_OR_DEPARTMENT_OTHER): Payer: Self-pay | Admitting: Obstetrics & Gynecology

## 2022-06-16 ENCOUNTER — Other Ambulatory Visit: Payer: Self-pay | Admitting: Emergency Medicine

## 2022-06-16 DIAGNOSIS — Z1231 Encounter for screening mammogram for malignant neoplasm of breast: Secondary | ICD-10-CM

## 2022-07-06 ENCOUNTER — Other Ambulatory Visit: Payer: Self-pay | Admitting: Emergency Medicine

## 2022-07-06 DIAGNOSIS — I10 Essential (primary) hypertension: Secondary | ICD-10-CM

## 2022-07-14 ENCOUNTER — Ambulatory Visit (HOSPITAL_BASED_OUTPATIENT_CLINIC_OR_DEPARTMENT_OTHER): Payer: 59 | Admitting: Obstetrics & Gynecology

## 2022-07-14 ENCOUNTER — Encounter (HOSPITAL_BASED_OUTPATIENT_CLINIC_OR_DEPARTMENT_OTHER): Payer: Self-pay | Admitting: Obstetrics & Gynecology

## 2022-07-14 VITALS — BP 139/78 | HR 62 | Ht 63.0 in | Wt 194.8 lb

## 2022-07-14 DIAGNOSIS — Z8742 Personal history of other diseases of the female genital tract: Secondary | ICD-10-CM | POA: Diagnosis not present

## 2022-07-14 DIAGNOSIS — N926 Irregular menstruation, unspecified: Secondary | ICD-10-CM

## 2022-07-14 MED ORDER — NORETHINDRONE ACETATE 5 MG PO TABS: 5.0000 mg | ORAL_TABLET | Freq: Every day | ORAL | 5 refills | Status: AC

## 2022-07-14 NOTE — Progress Notes (Signed)
GYNECOLOGY  VISIT  CC:   follow up after ablation  HPI: 46 y.o. G37P4 Married [place] or [place] American female here for follow up after endometrial ablation done 5/312/2023.  Bleeding is much better and she doesn't have to poise pads any more but bleeding still lasts 5 days.  Also, she's having bleeding for 3 days mid cycle.  This has recently started.  Wonders why she's having the new mid cycle bleeding.  Fibroids and adenomyosis discussed as causes of endometrial ablation failure.  .   Past Medical History:  Diagnosis Date   Allergy    Anemia    yrs ago with pregnancy 29 yrs ago    Anxiety    Breast fibroadenoma    Hypertension     MEDS:   Current Outpatient Medications on File Prior to Visit  Medication Sig Dispense Refill   amLODipine-valsartan (EXFORGE) 10-320 MG tablet TAKE 1 TABLET BY MOUTH DAILY 90 tablet 3   cetirizine (ZYRTEC) 10 MG tablet TAKE 1 TABLET(10 MG) BY MOUTH AT BEDTIME 90 tablet 3   escitalopram (LEXAPRO) 20 MG tablet Take 20 mg by mouth daily.     Fluticasone-Umeclidin-Vilant (TRELEGY ELLIPTA) 100-62.5-25 MCG/ACT AEPB Inhale 1 puff into the lungs daily. 1 each 11   ibuprofen (ADVIL) 800 MG tablet Take 1 tablet (800 mg total) by mouth every 8 (eight) hours as needed. 30 tablet 2   montelukast (SINGULAIR) 10 MG tablet Take 1 tablet (10 mg total) by mouth at bedtime. 90 tablet 3   Multiple Vitamins-Minerals (MULTIVITAMIN WOMEN 50+ PO) Take by mouth.     Olopatadine HCl (PATADAY) 0.2 % SOLN Apply 1 drop to eye daily. 2.5 mL 5   ALPRAZolam (XANAX) 0.5 MG tablet TAKE 1 TABLET(0.5 MG) BY MOUTH TWICE DAILY AS NEEDED FOR ANXIETY (Patient not taking: Reported on 07/14/2022) 20 tablet 1   No current facility-administered medications on file prior to visit.    ALLERGIES: Other  SH:  married, non smoker  Review of Systems  Constitutional: Negative.   Genitourinary:        Mid cycle bleeding    PHYSICAL EXAMINATION:    BP 139/78 (BP Location: Right Arm, Patient  Position: Sitting, Cuff Size: Large)   Pulse 62   Ht '5\' 3"'$  (1.6 m) Comment: Reported  Wt 194 lb 12.8 oz (88.4 kg)   BMI 34.51 kg/m     General appearance: alert, cooperative and appears stated age CV:  Regular rate and rhythm   Assessment/Plan: 1. History of menorrhagia - improved after endometrial ablation  2. Irregular bleeding - will try oral progesterone daily for next two to three months to see if helps. - she will give update after the new year - norethindrone (AYGESTIN) 5 MG tablet; Take 1 tablet (5 mg total) by mouth daily.  Dispense: 30 tablet; Refill: 5

## 2022-07-15 ENCOUNTER — Ambulatory Visit
Admission: RE | Admit: 2022-07-15 | Discharge: 2022-07-15 | Disposition: A | Discharge status: Home / Self Care | Payer: 59 | Source: Ambulatory Visit | Attending: Emergency Medicine | Admitting: Emergency Medicine

## 2022-07-15 ENCOUNTER — Inpatient Hospital Stay: Admission: RE | Admit: 2022-07-15 | Payer: 59 | Source: Ambulatory Visit

## 2022-07-15 DIAGNOSIS — Z1231 Encounter for screening mammogram for malignant neoplasm of breast: Secondary | ICD-10-CM

## 2022-08-25 IMAGING — MG MM DIGITAL SCREENING BILAT W/ TOMO AND CAD
8 series · 8 of 24 positions shown · non-contrast
Comparison: Previous exam(s).

CLINICAL DATA: Screening.

EXAM:
DIGITAL SCREENING BILATERAL MAMMOGRAM WITH TOMOSYNTHESIS AND CAD
TECHNIQUE: Bilateral screening digital craniocaudal and mediolateral oblique
mammograms were obtained. Bilateral screening digital breast
tomosynthesis was performed. The images were evaluated with
computer-aided detection.

[R MLO synth-2D]
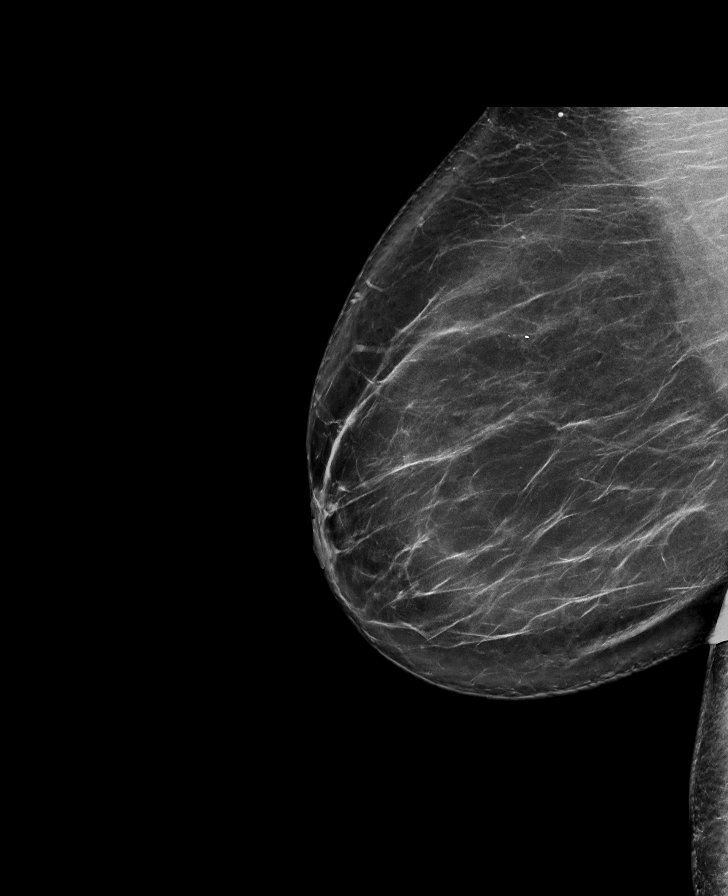

[L MLO synth-2D]
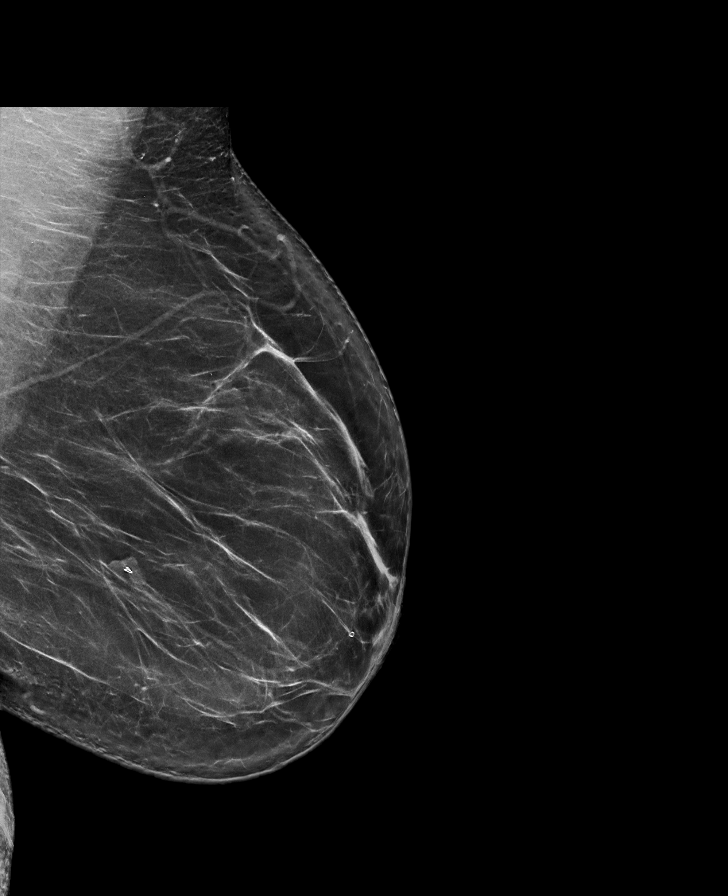

[L CC synth-2D]
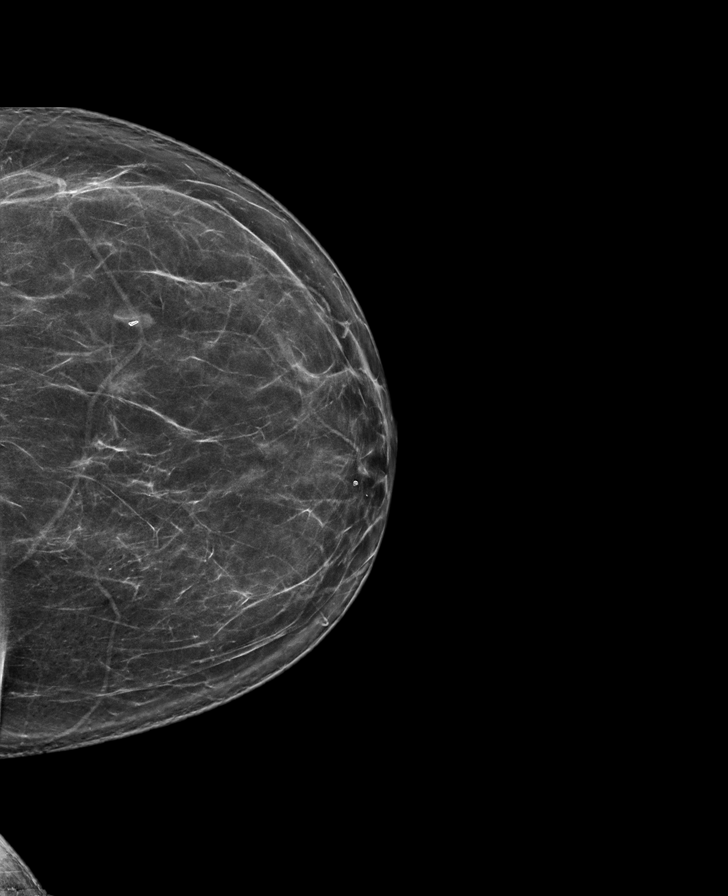

[R CC synth-2D]
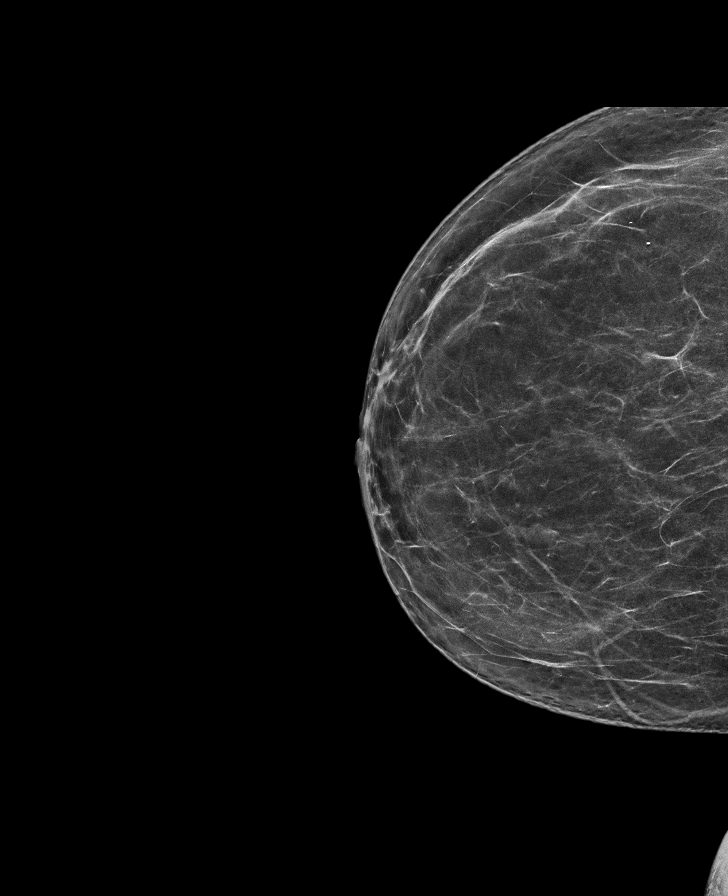

[R MLO tomo · tomo slice 47/93.0]
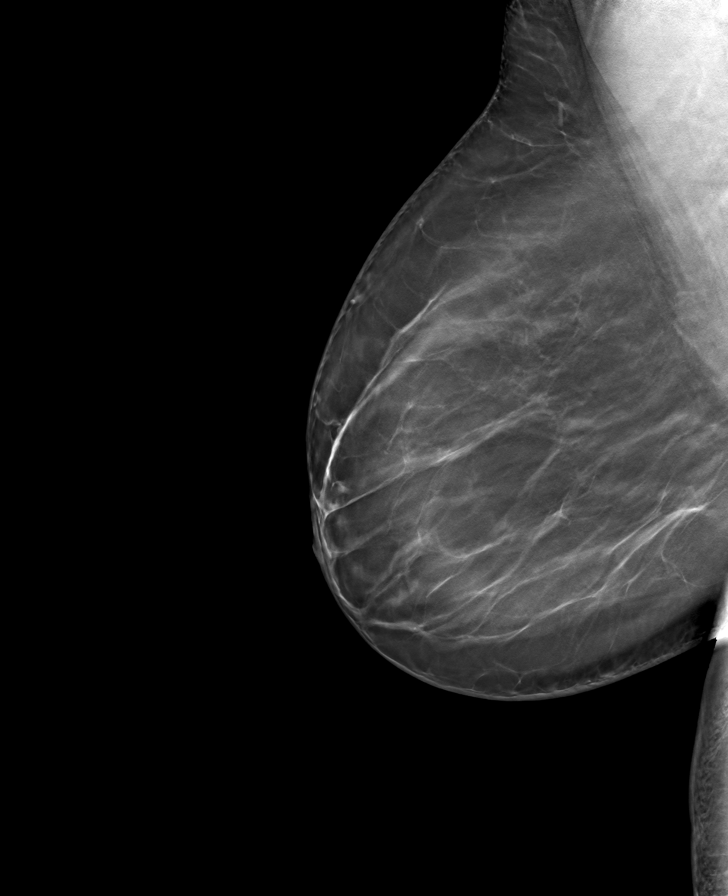

[R CC tomo · tomo slice 37/74.0]
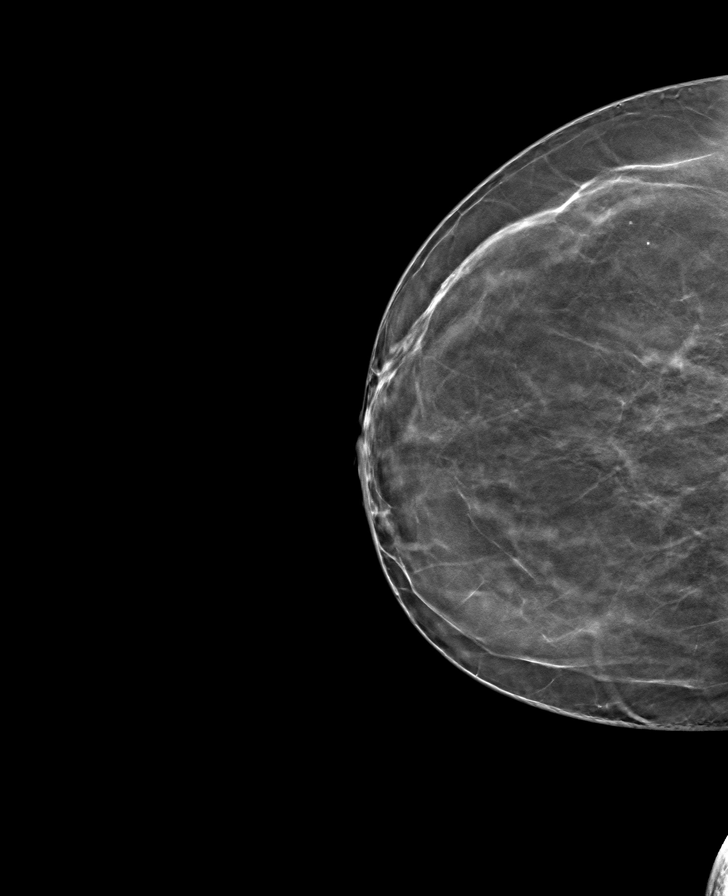

[L MLO tomo · tomo slice 45/88.0]
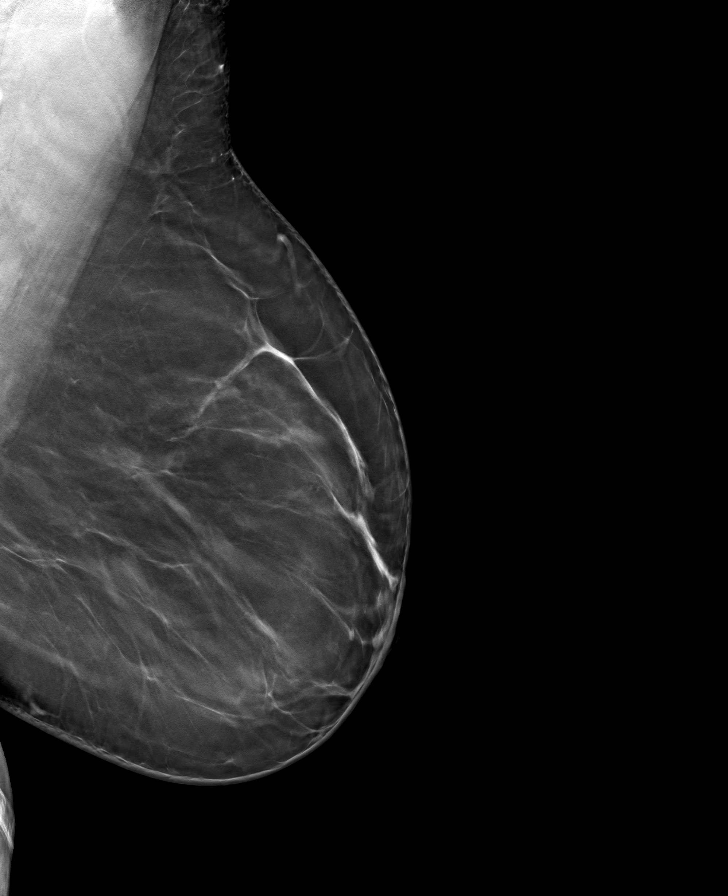

[L CC tomo · tomo slice 39/77.0]
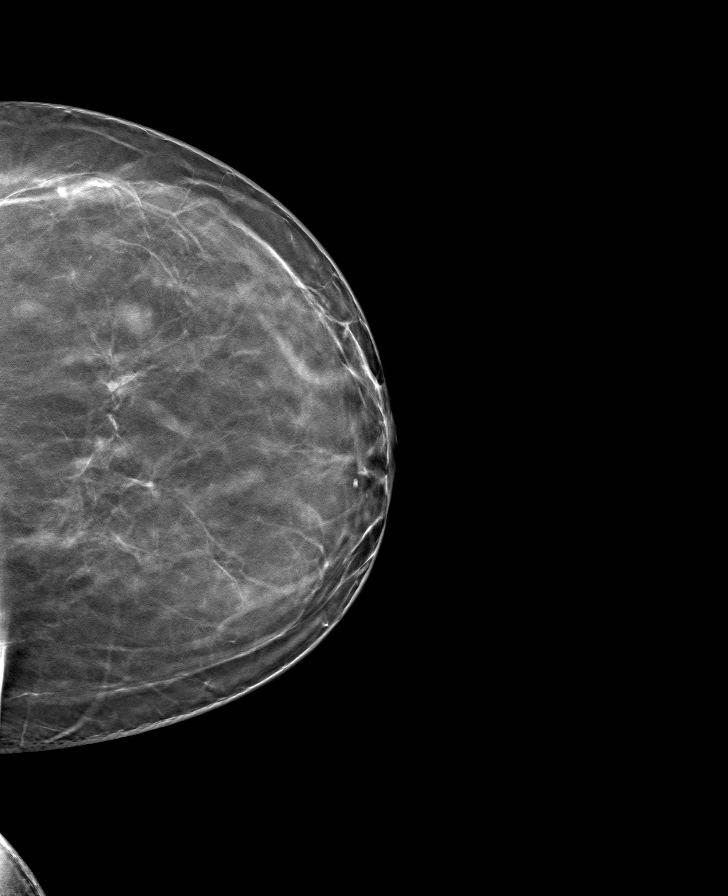

[8 of 24 positions shown; findings below may reference images not displayed]

ACR Breast Density Category b: There are scattered areas of
fibroglandular density.
FINDINGS: There are no findings suspicious for malignancy.
IMPRESSION: No mammographic evidence of malignancy. A result letter of this
screening mammogram will be mailed directly to the patient.

RECOMMENDATION:
Screening mammogram in one year. (Code:51-O-LD2)

BI-RADS CATEGORY  1: Negative.

## 2022-09-07 IMAGING — DX DG CHEST 1V PORT
1 series · 1 of 1 positions shown · non-contrast
Comparison: June 11, 2012

CLINICAL DATA: Chest pain in a 45-year-old female.

EXAM:
PORTABLE CHEST 1 VIEW

[chest ap]
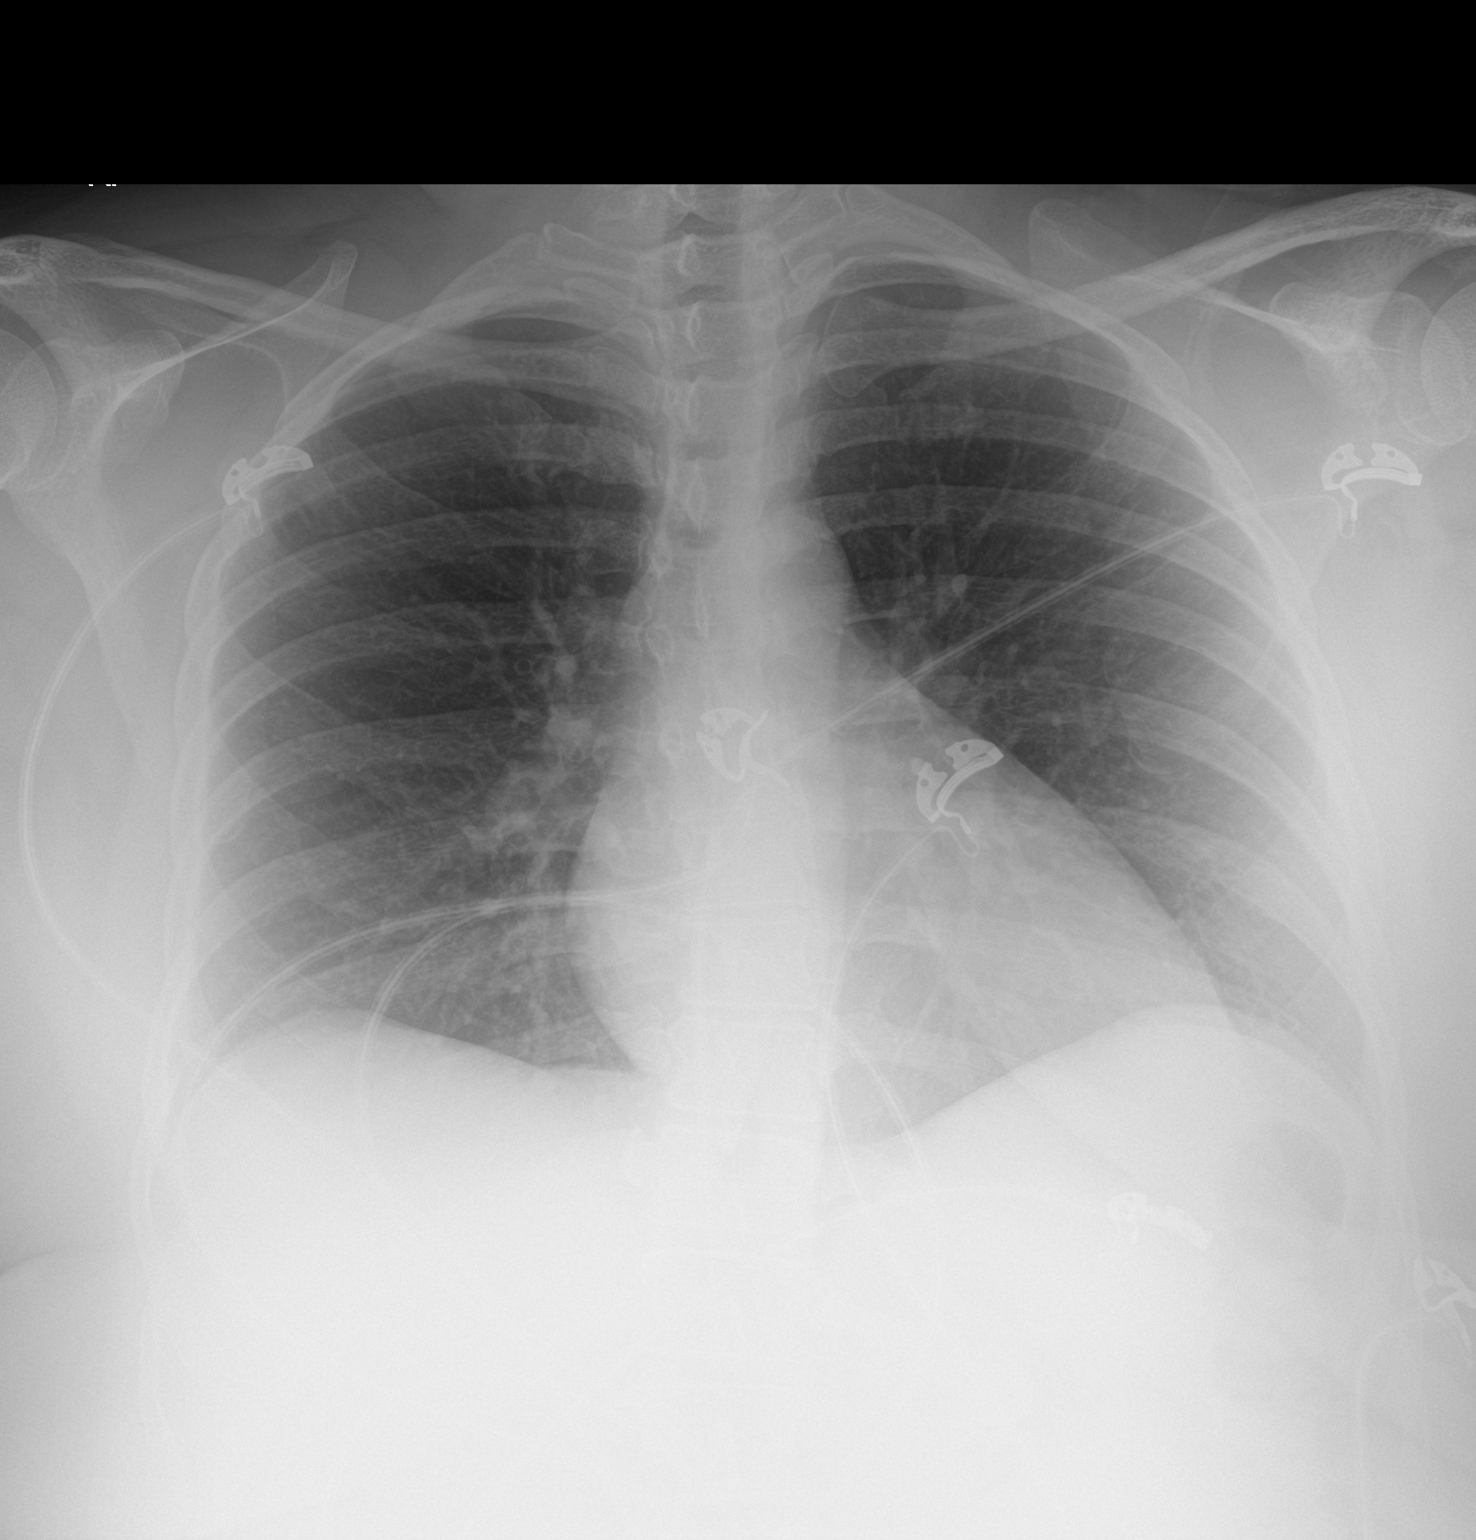

[1 of 1 positions shown; findings below may reference images not displayed]

FINDINGS: EKG leads project over the chest.

Trachea midline.

Cardiomediastinal contours and hilar structures are normal. Lungs
are clear. No visible pneumothorax or pleural effusion.

On limited assessment there is no acute skeletal process.
IMPRESSION: No acute cardiopulmonary disease.

## 2022-09-11 ENCOUNTER — Other Ambulatory Visit: Payer: Self-pay | Admitting: Emergency Medicine

## 2022-09-22 HISTORY — PX: WISDOM TOOTH EXTRACTION: SHX21

## 2022-10-03 ENCOUNTER — Encounter: Payer: Self-pay | Admitting: Emergency Medicine

## 2022-10-07 ENCOUNTER — Encounter: Payer: Self-pay | Admitting: Emergency Medicine

## 2022-10-07 ENCOUNTER — Ambulatory Visit (INDEPENDENT_AMBULATORY_CARE_PROVIDER_SITE_OTHER): Payer: 59 | Admitting: Emergency Medicine

## 2022-10-07 VITALS — BP 140/94 | HR 84 | Temp 98.4°F | Ht 63.0 in | Wt 189.5 lb

## 2022-10-07 DIAGNOSIS — I10 Essential (primary) hypertension: Secondary | ICD-10-CM

## 2022-10-07 LAB — LIPID PANEL
Cholesterol: 156 mg/dL (ref 0–200)
HDL: 41.5 mg/dL (ref >39.00)
LDL Cholesterol: 89 mg/dL (ref 0–99)
NonHDL: 114.17
Total CHOL/HDL Ratio: 4
Triglycerides: 124 mg/dL (ref 0.0–149.0)
VLDL: 24.8 mg/dL (ref 0.0–40.0)

## 2022-10-07 LAB — COMPREHENSIVE METABOLIC PANEL
ALT: 15 U/L (ref 0–35)
AST: 16 U/L (ref 0–37)
Albumin: 4.2 g/dL (ref 3.5–5.2)
Alkaline Phosphatase: 51 U/L (ref 39–117)
BUN: 11 mg/dL (ref 6–23)
CO2: 27 mEq/L (ref 19–32)
Calcium: 9.5 mg/dL (ref 8.4–10.5)
Chloride: 101 mEq/L (ref 96–112)
Creatinine, Ser: 0.96 mg/dL (ref 0.40–1.20)
GFR: 70.25 mL/min (ref >60.00)
Glucose, Bld: 120 mg/dL — ABNORMAL HIGH (ref 70–99)
Potassium: 4 mEq/L (ref 3.5–5.1)
Sodium: 137 mEq/L (ref 135–145)
Total Bilirubin: 0.3 mg/dL (ref 0.2–1.2)
Total Protein: 8 g/dL (ref 6.0–8.3)

## 2022-10-07 LAB — CBC WITH DIFFERENTIAL/PLATELET
Basophils Absolute: 0 10*3/uL (ref 0.0–0.1)
Basophils Relative: 0.4 % (ref 0.0–3.0)
Eosinophils Absolute: 0.1 10*3/uL (ref 0.0–0.7)
Eosinophils Relative: 0.9 % (ref 0.0–5.0)
HCT: 36.1 % (ref 36.0–46.0)
Hemoglobin: 12 g/dL (ref 12.0–15.0)
Lymphocytes Relative: 36.9 % (ref 12.0–46.0)
Lymphs Abs: 2.7 10*3/uL (ref 0.7–4.0)
MCHC: 33.2 g/dL (ref 30.0–36.0)
MCV: 94 fl (ref 78.0–100.0)
Monocytes Absolute: 0.4 10*3/uL (ref 0.1–1.0)
Monocytes Relative: 5.4 % (ref 3.0–12.0)
Neutro Abs: 4.1 10*3/uL (ref 1.4–7.7)
Neutrophils Relative %: 56.4 % (ref 43.0–77.0)
Platelets: 476 10*3/uL — ABNORMAL HIGH (ref 150.0–400.0)
RBC: 3.84 Mil/uL — ABNORMAL LOW (ref 3.87–5.11)
RDW: 12.9 % (ref 11.5–15.5)
WBC: 7.3 10*3/uL (ref 4.0–10.5)

## 2022-10-07 LAB — HEMOGLOBIN A1C: Hgb A1c MFr Bld: 5.4 % (ref 4.6–6.5)

## 2022-10-07 MED ORDER — HYDROCHLOROTHIAZIDE 25 MG PO TABS: 25.0000 mg | ORAL_TABLET | Freq: Every day | ORAL | 3 refills | Status: DC

## 2022-10-07 NOTE — Patient Instructions (Signed)
Hypertension, Adult High blood pressure (hypertension) is when the force of blood pumping through the arteries is too strong. The arteries are the blood vessels that carry blood from the heart throughout the body. Hypertension forces the heart to work harder to pump blood and may cause arteries to become narrow or stiff. Untreated or uncontrolled hypertension can lead to a heart attack, heart failure, a stroke, kidney disease, and other problems. A blood pressure reading consists of a higher number over a lower number. Ideally, your blood pressure should be below 120/80. The first ("top") number is called the systolic pressure. It is a measure of the pressure in your arteries as your heart beats. The second ("bottom") number is called the diastolic pressure. It is a measure of the pressure in your arteries as the heart relaxes. What are the causes? The exact cause of this condition is not known. There are some conditions that result in high blood pressure. What increases the risk? Certain factors may make you more likely to develop high blood pressure. Some of these risk factors are under your control, including: Smoking. Not getting enough exercise or physical activity. Being overweight. Having too much fat, sugar, calories, or salt (sodium) in your diet. Drinking too much alcohol. Other risk factors include: Having a personal history of heart disease, diabetes, high cholesterol, or kidney disease. Stress. Having a family history of high blood pressure and high cholesterol. Having obstructive sleep apnea. Age. The risk increases with age. What are the signs or symptoms? High blood pressure may not cause symptoms. Very high blood pressure (hypertensive crisis) may cause: Headache. Fast or irregular heartbeats (palpitations). Shortness of breath. Nosebleed. Nausea and vomiting. Vision changes. Severe chest pain, dizziness, and seizures. How is this diagnosed? This condition is diagnosed by  measuring your blood pressure while you are seated, with your arm resting on a flat surface, your legs uncrossed, and your feet flat on the floor. The cuff of the blood pressure monitor will be placed directly against the skin of your upper arm at the level of your heart. Blood pressure should be measured at least twice using the same arm. Certain conditions can cause a difference in blood pressure between your right and left arms. If you have a high blood pressure reading during one visit or you have normal blood pressure with other risk factors, you may be asked to: Return on a different day to have your blood pressure checked again. Monitor your blood pressure at home for 1 week or longer. If you are diagnosed with hypertension, you may have other blood or imaging tests to help your health care provider understand your overall risk for other conditions. How is this treated? This condition is treated by making healthy lifestyle changes, such as eating healthy foods, exercising more, and reducing your alcohol intake. You may be referred for counseling on a healthy diet and physical activity. Your health care provider may prescribe medicine if lifestyle changes are not enough to get your blood pressure under control and if: Your systolic blood pressure is above 130. Your diastolic blood pressure is above 80. Your personal target blood pressure may vary depending on your medical conditions, your age, and other factors. Follow these instructions at home: Eating and drinking  Eat a diet that is high in fiber and potassium, and low in sodium, added sugar, and fat. An example of this eating plan is called the DASH diet. DASH stands for Dietary Approaches to Stop Hypertension. To eat this way: Eat   plenty of fresh fruits and vegetables. Try to fill one half of your plate at each meal with fruits and vegetables. Eat whole grains, such as whole-wheat pasta, brown rice, or whole-grain bread. Fill about one  fourth of your plate with whole grains. Eat or drink low-fat dairy products, such as skim milk or low-fat yogurt. Avoid fatty cuts of meat, processed or cured meats, and poultry with skin. Fill about one fourth of your plate with lean proteins, such as fish, chicken without skin, beans, eggs, or tofu. Avoid pre-made and processed foods. These tend to be higher in sodium, added sugar, and fat. Reduce your daily sodium intake. Many people with hypertension should eat less than 1,500 mg of sodium a day. Do not drink alcohol if: Your health care provider tells you not to drink. You are pregnant, may be pregnant, or are planning to become pregnant. If you drink alcohol: Limit how much you have to: 0-1 drink a day for women. 0-2 drinks a day for men. Know how much alcohol is in your drink. In the [place] one drink equals one 12 oz bottle of beer (355 mL), one 5 oz glass of wine (148 mL), or one 1 oz glass of hard liquor (44 mL). Lifestyle  Work with your health care provider to maintain a healthy body weight or to lose weight. Ask what an ideal weight is for you. Get at least 30 minutes of exercise that causes your heart to beat faster (aerobic exercise) most days of the week. Activities may include walking, swimming, or biking. Include exercise to strengthen your muscles (resistance exercise), such as Pilates or lifting weights, as part of your weekly exercise routine. Try to do these types of exercises for 30 minutes at least 3 days a week. Do not use any products that contain nicotine or tobacco. These products include cigarettes, chewing tobacco, and vaping devices, such as e-cigarettes. If you need help quitting, ask your health care provider. Monitor your blood pressure at home as told by your health care provider. Keep all follow-up visits. This is important. Medicines Take over-the-counter and prescription medicines only as told by your health care provider. Follow directions carefully. Blood  pressure medicines must be taken as prescribed. Do not skip doses of blood pressure medicine. Doing this puts you at risk for problems and can make the medicine less effective. Ask your health care provider about side effects or reactions to medicines that you should watch for. Contact a health care provider if you: Think you are having a reaction to a medicine you are taking. Have headaches that keep coming back (recurring). Feel dizzy. Have swelling in your ankles. Have trouble with your vision. Get help right away if you: Develop a severe headache or confusion. Have unusual weakness or numbness. Feel faint. Have severe pain in your chest or abdomen. Vomit repeatedly. Have trouble breathing. These symptoms may be an emergency. Get help right away. Call 911. Do not wait to see if the symptoms will go away. Do not drive yourself to the hospital. Summary Hypertension is when the force of blood pumping through your arteries is too strong. If this condition is not controlled, it may put you at risk for serious complications. Your personal target blood pressure may vary depending on your medical conditions, your age, and other factors. For most people, a normal blood pressure is less than 120/80. Hypertension is treated with lifestyle changes, medicines, or a combination of both. Lifestyle changes include losing weight, eating a healthy,   low-sodium diet, exercising more, and limiting alcohol. This information is not intended to replace advice given to you by your health care provider. Make sure you discuss any questions you have with your health care provider. Document Revised: 07/09/2021 Document Reviewed: 07/09/2021 [employer] Patient Education  2023 [employer]  

## 2022-10-07 NOTE — Progress Notes (Signed)
Pamela Allen 47 y.o.   Chief Complaint  Patient presents with   Follow-up    Patient having blood pressure issues, running high. Headaches x 1 week   Cold like symptoms, covid  neg     HISTORY OF PRESENT ILLNESS: This is a 47 y.o. female complaining of elevated blood pressure readings at home with occasional headaches Also nursing a cold for the last week with sinus congestion and runny nose No other complaints or medical concerns today. BP Readings from Last 3 Encounters:  10/07/22 (!) 140/94  07/14/22 139/78  02/12/22 131/88     HPI   Prior to Admission medications   Medication Sig Start Date End Date Taking? Authorizing Provider  amLODipine-valsartan (EXFORGE) 10-320 MG tablet TAKE 1 TABLET BY MOUTH DAILY 07/07/22  Yes Valia Wingard, Ines Bloomer, MD  cetirizine (ZYRTEC) 10 MG tablet TAKE 1 TABLET(10 MG) BY MOUTH AT BEDTIME 03/10/22  Yes Ariv Penrod, Ines Bloomer, MD  escitalopram (LEXAPRO) 20 MG tablet TAKE 1 TABLET(20 MG) BY MOUTH DAILY 09/11/22  Yes Genevive Printup, Ines Bloomer, MD  Fluticasone-Umeclidin-Vilant (TRELEGY ELLIPTA) 100-62.5-25 MCG/ACT AEPB Inhale 1 puff into the lungs daily. 07/16/21  Yes Plotnikov, Evie Lacks, MD  ibuprofen (ADVIL) 800 MG tablet Take 1 tablet (800 mg total) by mouth every 8 (eight) hours as needed. 03/14/22  Yes Megan Salon, MD  montelukast (SINGULAIR) 10 MG tablet Take 1 tablet (10 mg total) by mouth at bedtime. 01/29/21  Yes Neomia Herbel, Ines Bloomer, MD  Multiple Vitamins-Minerals (MULTIVITAMIN WOMEN 50+ PO) Take by mouth.   Yes [provider]  norethindrone (AYGESTIN) 5 MG tablet Take 1 tablet (5 mg total) by mouth daily. 07/14/22  Yes Megan Salon, MD  Olopatadine HCl (PATADAY) 0.2 % SOLN Apply 1 drop to eye daily. 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    Patient Active Problem List   Diagnosis Date Noted   Menorrhagia with regular cycle 01/06/2022   Intramural uterine fibroid 01/06/2022   Perimenopausal 10/30/2021   History of uterine fibroid 10/30/2021   Family history of DVT 07/16/2021   Situational mixed anxiety and depressive disorder 11/15/2020   Iron deficiency 03/02/2018   Class 1 obesity due to excess calories with serious comorbidity and body mass index (BMI) of 34.0 to 34.9 in adult 03/02/2018   Seasonal allergies 01/02/2018   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   Vitamin D deficiency 11/11/2014   Essential hypertension 11/21/2011    Past Medical History:  Diagnosis Date   Allergy    Anemia    yrs ago with pregnancy 28 yrs ago    Anxiety    Breast fibroadenoma    Hypertension     Past Surgical History:  Procedure Laterality Date   BREAST BIOPSY Left 2017   DILITATION & CURRETTAGE/HYSTROSCOPY WITH NOVASURE ABLATION N/A 02/12/2022   Procedure: Pollyann Glen, POLYP RESECTION WITH MYOSURE, NOVASURE ABLATION;  Surgeon: Megan Salon, MD;  Location: [facility];  Service: Gynecology;  Laterality: N/A;   MASS EXCISION Left 01/17/2016   Procedure: EXCISION BIOPSY OF MASS LEFT BUTTOCK ;  Surgeon: Leighton Ruff, MD;  Location: [facility];  Service: General;  Laterality: Left;   TUBAL LIGATION Bilateral 10/30/2003   w/  Bilateral Salpingectomy   WISDOM TOOTH EXTRACTION  2017   lft side     Social History   Socioeconomic History   Marital status: Married    Spouse name: Not on file   Number of children: Not on file   Years of education: Not on file   Highest education level: Not on file  Occupational History   Not on file  Tobacco Use   Smoking status: Never   Smokeless tobacco: Never  Vaping Use   Vaping Use: Never used  Substance and Sexual Activity   Alcohol use: Yes    Comment: occasional   Drug use: No    Sexual activity: Not on file  Other Topics Concern   Not on file  Social History Narrative   Married   Cytogeneticist / CNA   Social Determinants of Health   Financial Resource Strain: Not on file  Food Insecurity: Not on file  Transportation Needs: Not on file  Physical Activity: Not on file  Stress: Not on file  Social Connections: Not on file  Intimate Partner Violence: Not on file    Family History  Problem Relation Age of Onset   Diabetes Mother    Heart disease Mother    Hypertension Mother    Stroke Mother    Deep vein thrombosis Mother    Cancer Father    Hypertension Father    Deep vein thrombosis Sister    Colon polyps Maternal Uncle    Colon cancer Neg Hx    Esophageal cancer Neg Hx    Rectal cancer Neg Hx    Stomach cancer Neg Hx      Review of Systems  Constitutional: Negative.  Negative for chills and fever.  HENT:  Positive for congestion. Negative for sore throat.   Respiratory: Negative.  Negative for cough and shortness of breath.   Cardiovascular: Negative.  Negative for chest pain and palpitations.  Gastrointestinal:  Negative for abdominal pain, diarrhea, nausea and vomiting.  Genitourinary: Negative.   Skin: Negative.  Negative for rash.  Neurological: Negative.  Negative for dizziness and headaches.  All other systems reviewed and are negative.  Today's Vitals   10/07/22 1306  BP: (!) 140/94  Pulse: 84  Temp: 98.4 F (36.9 C)  TempSrc: Oral  SpO2: 97%  Weight: 189 lb 8 oz (86 kg)  Height: '5\' 3"'$  (1.6 m)   Body mass index is 33.57 kg/m.   Physical Exam Vitals reviewed.  Constitutional:      Appearance: Normal appearance.  HENT:     Head: Normocephalic.     Nose: Congestion present.     Mouth/Throat:     Mouth: Mucous membranes are moist.     Pharynx: Oropharynx is clear.  Eyes:     Extraocular Movements: Extraocular movements intact.     Conjunctiva/sclera: Conjunctivae normal.     Pupils: Pupils are  equal, round, and reactive to light.  Cardiovascular:     Rate and Rhythm: Normal rate and regular rhythm.     Pulses: Normal pulses.     Heart sounds: Normal heart sounds.  Pulmonary:     Effort: Pulmonary effort is normal.     Breath sounds: Normal breath sounds.  Musculoskeletal:     Cervical back: No tenderness.  Lymphadenopathy:     Cervical: No cervical adenopathy.  Skin:    General: Skin is warm and dry.  Neurological:     General: No focal deficit present.  Mental Status: She is alert and oriented to person, place, and time.  Psychiatric:        Mood and Affect: Mood normal.        Behavior: Behavior normal.      ASSESSMENT & PLAN: A total of 45 minutes was spent with the patient and counseling/coordination of care regarding preparing for this visit, review of most recent office visit notes, review of most recent blood work results, diagnosis of uncontrolled hypertension and management, review of all medications and changes made, cardiovascular risks associated with uncontrolled hypertension, education on nutrition, prognosis, documentation, need for follow-up in 3 months  Problem List Items Addressed This Visit       Cardiovascular and Mediastinum   Essential hypertension - Primary    Uncontrolled hypertension with elevated blood pressure readings at home and in the office. Cardiovascular risks associated with hypertension discussed Dietary approaches to stop hypertension discussed. Recommend to continue Exforge a 10-320 mg daily and start hydrochlorothiazide 25 mg daily Advised to continue monitoring blood pressure readings at home daily for the next several weeks and keep a log.  Advised to contact the office if numbers persistently abnormal. Blood work done today to assess renal function and electrolytes Follow-up in 3 months.      Relevant Medications   hydrochlorothiazide (HYDRODIURIL) 25 MG tablet   Other Relevant Orders   CBC with Differential/Platelet    Comprehensive metabolic panel   Hemoglobin A1c   Lipid panel   Patient Instructions  Hypertension, Adult High blood pressure (hypertension) is when the force of blood pumping through the arteries is too strong. The arteries are the blood vessels that carry blood from the heart throughout the body. Hypertension forces the heart to work harder to pump blood and may cause arteries to become narrow or stiff. Untreated or uncontrolled hypertension can lead to a heart attack, heart failure, a stroke, kidney disease, and other problems. A blood pressure reading consists of a higher number over a lower number. Ideally, your blood pressure should be below 120/80. The first ("top") number is called the systolic pressure. It is a measure of the pressure in your arteries as your heart beats. The second ("bottom") number is called the diastolic pressure. It is a measure of the pressure in your arteries as the heart relaxes. What are the causes? The exact cause of this condition is not known. There are some conditions that result in high blood pressure. What increases the risk? Certain factors may make you more likely to develop high blood pressure. Some of these risk factors are under your control, including: Smoking. Not getting enough exercise or physical activity. Being overweight. Having too much fat, sugar, calories, or salt (sodium) in your diet. Drinking too much alcohol. Other risk factors include: Having a personal history of heart disease, diabetes, high cholesterol, or kidney disease. Stress. Having a family history of high blood pressure and high cholesterol. Having obstructive sleep apnea. Age. The risk increases with age. What are the signs or symptoms? High blood pressure may not cause symptoms. Very high blood pressure (hypertensive crisis) may cause: Headache. Fast or irregular heartbeats (palpitations). Shortness of breath. Nosebleed. Nausea and vomiting. Vision changes. Severe  chest pain, dizziness, and seizures. How is this diagnosed? This condition is diagnosed by measuring your blood pressure while you are seated, with your arm resting on a flat surface, your legs uncrossed, and your feet flat on the floor. The cuff of the blood pressure monitor will be placed directly against  the skin of your upper arm at the level of your heart. Blood pressure should be measured at least twice using the same arm. Certain conditions can cause a difference in blood pressure between your right and left arms. If you have a high blood pressure reading during one visit or you have normal blood pressure with other risk factors, you may be asked to: Return on a different day to have your blood pressure checked again. Monitor your blood pressure at home for 1 week or longer. If you are diagnosed with hypertension, you may have other blood or imaging tests to help your health care provider understand your overall risk for other conditions. How is this treated? This condition is treated by making healthy lifestyle changes, such as eating healthy foods, exercising more, and reducing your alcohol intake. You may be referred for counseling on a healthy diet and physical activity. Your health care provider may prescribe medicine if lifestyle changes are not enough to get your blood pressure under control and if: Your systolic blood pressure is above 130. Your diastolic blood pressure is above 80. Your personal target blood pressure may vary depending on your medical conditions, your age, and other factors. Follow these instructions at home: Eating and drinking  Eat a diet that is high in fiber and potassium, and low in sodium, added sugar, and fat. An example of this eating plan is called the DASH diet. DASH stands for Dietary Approaches to Stop Hypertension. To eat this way: Eat plenty of fresh fruits and vegetables. Try to fill one half of your plate at each meal with fruits and vegetables. Eat  whole grains, such as whole-wheat pasta, brown rice, or whole-grain bread. Fill about one fourth of your plate with whole grains. Eat or drink low-fat dairy products, such as skim milk or low-fat yogurt. Avoid fatty cuts of meat, processed or cured meats, and poultry with skin. Fill about one fourth of your plate with lean proteins, such as fish, chicken without skin, beans, eggs, or tofu. Avoid pre-made and processed foods. These tend to be higher in sodium, added sugar, and fat. Reduce your daily sodium intake. Many people with hypertension should eat less than 1,500 mg of sodium a day. Do not drink alcohol if: Your health care provider tells you not to drink. You are pregnant, may be pregnant, or are planning to become pregnant. If you drink alcohol: Limit how much you have to: 0-1 drink a day for women. 0-2 drinks a day for men. Know how much alcohol is in your drink. In the [place] one drink equals one 12 oz bottle of beer (355 mL), one 5 oz glass of wine (148 mL), or one 1 oz glass of hard liquor (44 mL). Lifestyle  Work with your health care provider to maintain a healthy body weight or to lose weight. Ask what an ideal weight is for you. Get at least 30 minutes of exercise that causes your heart to beat faster (aerobic exercise) most days of the week. Activities may include walking, swimming, or biking. Include exercise to strengthen your muscles (resistance exercise), such as Pilates or lifting weights, as part of your weekly exercise routine. Try to do these types of exercises for 30 minutes at least 3 days a week. Do not use any products that contain nicotine or tobacco. These products include cigarettes, chewing tobacco, and vaping devices, such as e-cigarettes. If you need help quitting, ask your health care provider. Monitor your blood pressure at home as  told by your health care provider. Keep all follow-up visits. This is important. Medicines Take over-the-counter and  prescription medicines only as told by your health care provider. Follow directions carefully. Blood pressure medicines must be taken as prescribed. Do not skip doses of blood pressure medicine. Doing this puts you at risk for problems and can make the medicine less effective. Ask your health care provider about side effects or reactions to medicines that you should watch for. Contact a health care provider if you: Think you are having a reaction to a medicine you are taking. Have headaches that keep coming back (recurring). Feel dizzy. Have swelling in your ankles. Have trouble with your vision. Get help right away if you: Develop a severe headache or confusion. Have unusual weakness or numbness. Feel faint. Have severe pain in your chest or abdomen. Vomit repeatedly. Have trouble breathing. These symptoms may be an emergency. Get help right away. Call 911. Do not wait to see if the symptoms will go away. Do not drive yourself to the hospital. Summary Hypertension is when the force of blood pumping through your arteries is too strong. If this condition is not controlled, it may put you at risk for serious complications. Your personal target blood pressure may vary depending on your medical conditions, your age, and other factors. For most people, a normal blood pressure is less than 120/80. Hypertension is treated with lifestyle changes, medicines, or a combination of both. Lifestyle changes include losing weight, eating a healthy, low-sodium diet, exercising more, and limiting alcohol. This information is not intended to replace advice given to you by your health care provider. Make sure you discuss any questions you have with your health care provider. Document Revised: 07/09/2021 Document Reviewed: 07/09/2021 Elsevier Patient Education  [place], MD [facility]

## 2022-10-07 NOTE — Assessment & Plan Note (Signed)
Uncontrolled hypertension with elevated blood pressure readings at home and in the office. Cardiovascular risks associated with hypertension discussed Dietary approaches to stop hypertension discussed. Recommend to continue Exforge a 10-320 mg daily and start hydrochlorothiazide 25 mg daily Advised to continue monitoring blood pressure readings at home daily for the next several weeks and keep a log.  Advised to contact the office if numbers persistently abnormal. Blood work done today to assess renal function and electrolytes Follow-up in 3 months.

## 2022-10-13 ENCOUNTER — Emergency Department (HOSPITAL_BASED_OUTPATIENT_CLINIC_OR_DEPARTMENT_OTHER)
Admission: EM | Admit: 2022-10-13 | Discharge: 2022-10-13 | Disposition: A | Discharge status: Home / Self Care | Payer: 59 | Attending: Emergency Medicine | Admitting: Emergency Medicine

## 2022-10-13 ENCOUNTER — Encounter (HOSPITAL_BASED_OUTPATIENT_CLINIC_OR_DEPARTMENT_OTHER): Payer: Self-pay

## 2022-10-13 ENCOUNTER — Other Ambulatory Visit: Payer: Self-pay

## 2022-10-13 DIAGNOSIS — J0110 Acute frontal sinusitis, unspecified: Secondary | ICD-10-CM | POA: Diagnosis not present

## 2022-10-13 DIAGNOSIS — R519 Headache, unspecified: Secondary | ICD-10-CM | POA: Diagnosis present

## 2022-10-13 DIAGNOSIS — Z1152 Encounter for screening for COVID-19: Secondary | ICD-10-CM | POA: Diagnosis not present

## 2022-10-13 LAB — RESP PANEL BY RT-PCR (RSV, FLU A&B, COVID)  RVPGX2
Influenza A by PCR: NEGATIVE
Influenza B by PCR: NEGATIVE
Resp Syncytial Virus by PCR: NEGATIVE
SARS Coronavirus 2 by RT PCR: NEGATIVE

## 2022-10-13 MED ORDER — AMOXICILLIN-POT CLAVULANATE 875-125 MG PO TABS
1.0000 | ORAL_TABLET | Freq: Once | ORAL | Status: AC
  Administered 2022-10-13: 1 via ORAL
  Filled 2022-10-13: qty 1

## 2022-10-13 MED ORDER — AMOXICILLIN-POT CLAVULANATE 875-125 MG PO TABS: 1.0000 | ORAL_TABLET | Freq: Two times a day (BID) | ORAL | 0 refills | Status: AC

## 2022-10-13 MED ORDER — DEXAMETHASONE 4 MG PO TABS
10.0000 mg | ORAL_TABLET | Freq: Once | ORAL | Status: AC
  Administered 2022-10-13: 10 mg via ORAL
  Filled 2022-10-13: qty 3

## 2022-10-13 MED ORDER — PROCHLORPERAZINE EDISYLATE 10 MG/2ML IJ SOLN
10.0000 mg | Freq: Once | INTRAMUSCULAR | Status: AC
  Administered 2022-10-13: 10 mg via INTRAMUSCULAR
  Filled 2022-10-13: qty 2

## 2022-10-13 MED ORDER — DIPHENHYDRAMINE HCL 50 MG/ML IJ SOLN
25.0000 mg | Freq: Once | INTRAMUSCULAR | Status: AC
  Administered 2022-10-13: 25 mg via INTRAMUSCULAR
  Filled 2022-10-13: qty 1

## 2022-10-13 NOTE — Discharge Instructions (Signed)
Take tylenol 2 pills 4 times a day and motrin 4 pills 3 times a day.  Drink plenty of fluids.  Return for worsening shortness of breath, headache, confusion. Follow up with your family doctor.  ° °

## 2022-10-13 NOTE — ED Triage Notes (Signed)
In for eval of headache, nasal congestion, fever, and dizziness. Onset Friday night. Temp 101.6, last dose tylenol 0900 today and ibuprofen last dose last pm. Nausea. Denies vomiting or diarrhea.

## 2022-10-13 NOTE — ED Provider Notes (Signed)
[name] Provider Note   CSN: [id number] Arrival date & time: 10/13/22  1041     History  Chief Complaint  Patient presents with   Headache   Fever    Pamela Allen is a 47 y.o. female.  47 yo F with a cc of cough congestion headache.  Started about 2 weeks ago.  Slow worsening headache, pain to the right frontal region mostly. Fevers off an on.  No known sick contacts.    Headache Associated symptoms: fever   Fever Associated symptoms: headaches        Home Medications Prior to Admission medications   Medication Sig Start Date End Date Taking? Authorizing Provider  amLODipine-valsartan (EXFORGE) 10-320 MG tablet TAKE 1 TABLET BY MOUTH DAILY 07/07/22  Yes Sagardia, Ines Bloomer, MD  amoxicillin-clavulanate (AUGMENTIN) 875-125 MG tablet Take 1 tablet by mouth every 12 (twelve) hours. 10/13/22  Yes Deno Etienne, DO  cetirizine (ZYRTEC) 10 MG tablet TAKE 1 TABLET(10 MG) BY MOUTH AT BEDTIME 03/10/22  Yes Sagardia, Ines Bloomer, MD  escitalopram (LEXAPRO) 20 MG tablet TAKE 1 TABLET(20 MG) BY MOUTH DAILY 09/11/22  Yes Sagardia, Ines Bloomer, MD  Fluticasone-Umeclidin-Vilant (TRELEGY ELLIPTA) 100-62.5-25 MCG/ACT AEPB Inhale 1 puff into the lungs daily. 07/16/21  Yes Plotnikov, Evie Lacks, MD  hydrochlorothiazide (HYDRODIURIL) 25 MG tablet Take 1 tablet (25 mg total) by mouth daily. 10/07/22  Yes Sagardia, Ines Bloomer, MD  montelukast (SINGULAIR) 10 MG tablet Take 1 tablet (10 mg total) by mouth at bedtime. 01/29/21  Yes Sagardia, Ines Bloomer, MD  Multiple Vitamins-Minerals (MULTIVITAMIN WOMEN 50+ PO) Take by mouth.   Yes [provider]  norethindrone (AYGESTIN) 5 MG tablet Take 1 tablet (5 mg total) by mouth daily. 07/14/22  Yes Megan Salon, MD  Olopatadine HCl (PATADAY) 0.2 % SOLN Apply 1 drop to eye daily. 03/23/19  Yes Sagardia, Ines Bloomer, MD  ALPRAZolam Duanne Moron) 0.5 MG tablet TAKE 1 TABLET(0.5 MG) BY MOUTH TWICE DAILY AS NEEDED  FOR ANXIETY Patient not taking: Reported on 07/14/2022 02/20/21   Horald Pollen, MD  ibuprofen (ADVIL) 800 MG tablet Take 1 tablet (800 mg total) by mouth every 8 (eight) hours as needed. 03/14/22   Megan Salon, MD      Allergies    Other    Review of Systems   Review of Systems  Constitutional:  Positive for fever.  Neurological:  Positive for headaches.    Physical Exam Updated Vital Signs BP 139/79   Pulse 95   Temp 99.8 F (37.7 C) (Oral)   Resp 18   Ht '5\' 3"'$  (1.6 m)   Wt 84.4 kg   SpO2 97%   BMI 32.95 kg/m  Physical Exam Vitals and nursing note reviewed.  Constitutional:      General: She is not in acute distress.    Appearance: She is well-developed. She is not diaphoretic.  HENT:     Head: Normocephalic and atraumatic.     Comments: Swollen turbinates, posterior nasal drip,R frontal sinus with exquisite tenderness to percussion, tm normal bilaterally.   Eyes:     Pupils: Pupils are equal, round, and reactive to light.  Cardiovascular:     Rate and Rhythm: Normal rate and regular rhythm.     Heart sounds: No murmur heard.    No friction rub. No gallop.  Pulmonary:     Effort: Pulmonary effort is normal.     Breath sounds: No wheezing or rales.  Abdominal:     General: There is no distension.     Palpations: Abdomen is soft.     Tenderness: There is no abdominal tenderness.  Musculoskeletal:        General: No tenderness.     Cervical back: Normal range of motion and neck supple.  Skin:    General: Skin is warm and dry.  Neurological:     Mental Status: She is alert and oriented to person, place, and time.  Psychiatric:        Behavior: Behavior normal.     ED Results / Procedures / Treatments   Labs (all labs ordered are listed, but only abnormal results are displayed) Labs Reviewed  RESP PANEL BY RT-PCR (RSV, FLU A&B, COVID)  [id number]    EKG None  Radiology No results found.  Procedures Procedures    Medications Ordered in  ED Medications  amoxicillin-clavulanate (AUGMENTIN) 875-125 MG per tablet 1 tablet (has no administration in time range)  dexamethasone (DECADRON) tablet 10 mg (has no administration in time range)  prochlorperazine (COMPAZINE) injection 10 mg (has no administration in time range)  diphenhydrAMINE (BENADRYL) injection 25 mg (has no administration in time range)    ED Course/ Medical Decision Making/ A&P                             Medical Decision Making Risk Prescription drug management.   47 yo F with a cc of a headache.  Going on for the past few days after having uri like symptoms.  Patient clinically with sinusitis.  Will treat with Augmentin.  PCP follow up.  2:50 PM:  I have discussed the diagnosis/risks/treatment options with the patient.  Evaluation and diagnostic testing in the emergency department does not suggest an emergent condition requiring admission or immediate intervention beyond what has been performed at this time.  They will follow up with PCP. We also discussed returning to the ED immediately if new or worsening sx occur. We discussed the sx which are most concerning (e.g., sudden worsening pain, fever, inability to tolerate by mouth) that necessitate immediate return. Medications administered to the patient during their visit and any new prescriptions provided to the patient are listed below.  Medications given during this visit Medications  amoxicillin-clavulanate (AUGMENTIN) 875-125 MG per tablet 1 tablet (has no administration in time range)  dexamethasone (DECADRON) tablet 10 mg (has no administration in time range)  prochlorperazine (COMPAZINE) injection 10 mg (has no administration in time range)  diphenhydrAMINE (BENADRYL) injection 25 mg (has no administration in time range)     The patient appears reasonably screen and/or stabilized for discharge and I doubt any other medical condition or other [facility] requiring further screening, evaluation, or treatment in  the ED at this time prior to discharge.          Final Clinical Impression(s) / ED Diagnoses Final diagnoses:  Acute frontal sinusitis, recurrence not specified    Rx / DC Orders ED Discharge Orders          Ordered    amoxicillin-clavulanate (AUGMENTIN) 875-125 MG tablet  Every 12 hours        10/13/22 [place], Ame Heagle, DO 10/13/22 1450

## 2022-10-13 NOTE — ED Notes (Signed)
States she is feeling better after medication administration, GCS 15, able to ambulate without assistance. Gait very steady, family member with client upon DC to home, AVS provided to pt and informed that a Rx has been sent to the pharmacy of her choice, work note also provided.

## 2022-10-21 ENCOUNTER — Encounter (HOSPITAL_BASED_OUTPATIENT_CLINIC_OR_DEPARTMENT_OTHER): Payer: Self-pay | Admitting: Obstetrics & Gynecology

## 2022-11-03 ENCOUNTER — Encounter: Payer: Self-pay | Admitting: *Deleted

## 2022-11-19 ENCOUNTER — Other Ambulatory Visit (HOSPITAL_BASED_OUTPATIENT_CLINIC_OR_DEPARTMENT_OTHER): Payer: Self-pay | Admitting: Obstetrics & Gynecology

## 2022-11-19 ENCOUNTER — Other Ambulatory Visit: Payer: Self-pay | Admitting: Emergency Medicine

## 2023-04-03 ENCOUNTER — Other Ambulatory Visit: Payer: Self-pay | Admitting: Emergency Medicine

## 2023-04-03 DIAGNOSIS — J302 Other seasonal allergic rhinitis: Secondary | ICD-10-CM

## 2023-07-09 ENCOUNTER — Other Ambulatory Visit: Payer: Self-pay | Admitting: Emergency Medicine

## 2023-07-09 DIAGNOSIS — Z1231 Encounter for screening mammogram for malignant neoplasm of breast: Secondary | ICD-10-CM

## 2023-07-23 ENCOUNTER — Ambulatory Visit
Admission: RE | Admit: 2023-07-23 | Discharge: 2023-07-23 | Disposition: A | Discharge status: Home / Self Care | Payer: 59 | Source: Ambulatory Visit | Attending: Emergency Medicine | Admitting: Emergency Medicine

## 2023-07-23 DIAGNOSIS — Z1231 Encounter for screening mammogram for malignant neoplasm of breast: Secondary | ICD-10-CM

## 2023-09-30 ENCOUNTER — Other Ambulatory Visit: Payer: Self-pay | Admitting: Emergency Medicine

## 2023-11-17 ENCOUNTER — Encounter: Payer: Self-pay | Admitting: Emergency Medicine

## 2023-11-17 ENCOUNTER — Ambulatory Visit: Payer: 59 | Admitting: Emergency Medicine

## 2023-11-17 VITALS — BP 134/102 | HR 74 | Temp 98.5°F | Ht 63.0 in | Wt 198.0 lb

## 2023-11-17 DIAGNOSIS — J302 Other seasonal allergic rhinitis: Secondary | ICD-10-CM | POA: Diagnosis not present

## 2023-11-17 DIAGNOSIS — E66811 Obesity, class 1: Secondary | ICD-10-CM

## 2023-11-17 DIAGNOSIS — E6609 Other obesity due to excess calories: Secondary | ICD-10-CM

## 2023-11-17 DIAGNOSIS — I10 Essential (primary) hypertension: Secondary | ICD-10-CM | POA: Diagnosis not present

## 2023-11-17 DIAGNOSIS — Z6834 Body mass index (BMI) 34.0-34.9, adult: Secondary | ICD-10-CM

## 2023-11-17 LAB — LIPID PANEL
Cholesterol: 158 mg/dL (ref 0–200)
HDL: 53.2 mg/dL (ref >39.00)
LDL Cholesterol: 85 mg/dL (ref 0–99)
NonHDL: 104.71
Total CHOL/HDL Ratio: 3
Triglycerides: 101 mg/dL (ref 0.0–149.0)
VLDL: 20.2 mg/dL (ref 0.0–40.0)

## 2023-11-17 LAB — COMPREHENSIVE METABOLIC PANEL
ALT: 28 U/L (ref 0–35)
AST: 22 U/L (ref 0–37)
Albumin: 4.4 g/dL (ref 3.5–5.2)
Alkaline Phosphatase: 46 U/L (ref 39–117)
BUN: 13 mg/dL (ref 6–23)
CO2: 28 meq/L (ref 19–32)
Calcium: 9.3 mg/dL (ref 8.4–10.5)
Chloride: 104 meq/L (ref 96–112)
Creatinine, Ser: 0.93 mg/dL (ref 0.40–1.20)
GFR: 72.41 mL/min (ref >60.00)
Glucose, Bld: 103 mg/dL — ABNORMAL HIGH (ref 70–99)
Potassium: 4.1 meq/L (ref 3.5–5.1)
Sodium: 138 meq/L (ref 135–145)
Total Bilirubin: 0.3 mg/dL (ref 0.2–1.2)
Total Protein: 7.7 g/dL (ref 6.0–8.3)

## 2023-11-17 LAB — CBC WITH DIFFERENTIAL/PLATELET
Basophils Absolute: 0 10*3/uL (ref 0.0–0.1)
Basophils Relative: 0.5 % (ref 0.0–3.0)
Eosinophils Absolute: 0.1 10*3/uL (ref 0.0–0.7)
Eosinophils Relative: 1.4 % (ref 0.0–5.0)
HCT: 35.5 % — ABNORMAL LOW (ref 36.0–46.0)
Hemoglobin: 11.8 g/dL — ABNORMAL LOW (ref 12.0–15.0)
Lymphocytes Relative: 44 % (ref 12.0–46.0)
Lymphs Abs: 2.4 10*3/uL (ref 0.7–4.0)
MCHC: 33.2 g/dL (ref 30.0–36.0)
MCV: 95.5 fl (ref 78.0–100.0)
Monocytes Absolute: 0.4 10*3/uL (ref 0.1–1.0)
Monocytes Relative: 6.7 % (ref 3.0–12.0)
Neutro Abs: 2.6 10*3/uL (ref 1.4–7.7)
Neutrophils Relative %: 47.4 % (ref 43.0–77.0)
Platelets: 295 10*3/uL (ref 150.0–400.0)
RBC: 3.72 Mil/uL — ABNORMAL LOW (ref 3.87–5.11)
RDW: 12.7 % (ref 11.5–15.5)
WBC: 5.5 10*3/uL (ref 4.0–10.5)

## 2023-11-17 LAB — HEMOGLOBIN A1C: Hgb A1c MFr Bld: 5.1 % (ref 4.6–6.5)

## 2023-11-17 MED ORDER — MONTELUKAST SODIUM 10 MG PO TABS
10.0000 mg | ORAL_TABLET | Freq: Every day | ORAL | 3 refills | Status: AC
Start: 2023-11-17 — End: ?

## 2023-11-17 MED ORDER — AMLODIPINE BESYLATE-VALSARTAN 10-320 MG PO TABS: 1.0000 | ORAL_TABLET | Freq: Every day | ORAL | 3 refills | Status: AC

## 2023-11-17 MED ORDER — HYDROCHLOROTHIAZIDE 25 MG PO TABS: 25.0000 mg | ORAL_TABLET | Freq: Every day | ORAL | 3 refills | Status: DC

## 2023-11-17 MED ORDER — CETIRIZINE HCL 10 MG PO TABS
10.0000 mg | ORAL_TABLET | Freq: Every day | ORAL | 3 refills | Status: AC
Start: 2023-11-17 — End: ?

## 2023-11-17 NOTE — Progress Notes (Signed)
 Pamela Allen 48 y.o.   Chief Complaint  Patient presents with   Medication Refill    Patient for here BP medication refill no other concerns    HISTORY OF PRESENT ILLNESS: This is a 48 y.o. female A1A here for follow-up of hypertension and medication refill Ran out of medication 4 days ago. Has been hypertensive since age 3 No other complaints or medical concerns today. BP Readings from Last 3 Encounters:  11/17/23 (!) 134/102  10/13/22 (!) 129/92  10/07/22 (!) 140/94   Wt Readings from Last 3 Encounters:  11/17/23 198 lb (89.8 kg)  10/13/22 186 lb (84.4 kg)  10/07/22 189 lb 8 oz (86 kg)     Medication Refill Pertinent negatives include no abdominal pain, chest pain, chills, congestion, coughing, fever, headaches, nausea, rash, sore throat or vomiting.     Prior to Admission medications   Medication Sig Start Date End Date Taking? Authorizing Provider  amLODipine-valsartan (EXFORGE) 10-320 MG tablet TAKE 1 TABLET BY MOUTH DAILY 07/07/22  Yes Aela Bohan, Eilleen Kempf, MD  cetirizine (ZYRTEC) 10 MG tablet TAKE 1 TABLET(10 MG) BY MOUTH AT BEDTIME 04/03/23  Yes Sussan Meter, Eilleen Kempf, MD  fluconazole (DIFLUCAN) 150 MG tablet TAKE 1 TABLET(150 MG) BY MOUTH 1 TIME FOR 1 DOSE 11/19/22  Yes Illyria Sobocinski, Eilleen Kempf, MD  hydrochlorothiazide (HYDRODIURIL) 25 MG tablet Take 1 tablet (25 mg total) by mouth daily. 10/07/22  Yes Sanye Ledesma, Eilleen Kempf, MD  montelukast (SINGULAIR) 10 MG tablet Take 1 tablet (10 mg total) by mouth at bedtime. 01/29/21  Yes Keghan Mcfarren, Eilleen Kempf, MD  Multiple Vitamins-Minerals (MULTIVITAMIN WOMEN 50+ PO) Take by mouth.   Yes [provider]  Olopatadine HCl (PATADAY) 0.2 % SOLN Apply 1 drop to eye daily. 03/23/19  Yes Wajiha Versteeg, Eilleen Kempf, MD  ALPRAZolam Prudy Feeler) 0.5 MG tablet TAKE 1 TABLET(0.5 MG) BY MOUTH TWICE DAILY AS NEEDED FOR ANXIETY Patient not taking: Reported on 11/17/2023 02/20/21   Georgina Quint, MD  amoxicillin-clavulanate (AUGMENTIN) 875-125  MG tablet Take 1 tablet by mouth every 12 (twelve) hours. Patient not taking: Reported on 11/17/2023 10/13/22   Melene Plan, DO  escitalopram (LEXAPRO) 20 MG tablet TAKE 1 TABLET(20 MG) BY MOUTH DAILY Patient not taking: Reported on 11/17/2023 09/30/23   Georgina Quint, MD  Fluticasone-Umeclidin-Vilant (TRELEGY ELLIPTA) 100-62.5-25 MCG/ACT AEPB Inhale 1 puff into the lungs daily. Patient not taking: Reported on 11/17/2023 07/16/21   Plotnikov, Georgina Quint, MD  ibuprofen (ADVIL) 800 MG tablet Take 1 tablet (800 mg total) by mouth every 8 (eight) hours as needed. Patient not taking: Reported on 11/17/2023 03/14/22   Jerene Bears, MD  norethindrone (AYGESTIN) 5 MG tablet Take 1 tablet (5 mg total) by mouth daily. 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    Patient Active Problem List   Diagnosis Date Noted   Menorrhagia with regular cycle 01/06/2022   Intramural uterine fibroid 01/06/2022   Perimenopausal 10/30/2021   History of uterine fibroid 10/30/2021   Family history of DVT 07/16/2021   Situational mixed anxiety and depressive disorder 11/15/2020   Iron deficiency 03/02/2018   Class 1 obesity due to excess calories with serious comorbidity and body mass index (BMI) of 34.0 to 34.9 in adult 03/02/2018   Seasonal allergies 01/02/2018   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   Vitamin D deficiency 11/11/2014   Essential hypertension 11/21/2011  Past Medical History:  Diagnosis Date   Allergy    Anemia    yrs ago with pregnancy 28 yrs ago    Anxiety    Breast fibroadenoma    Hypertension     Past Surgical History:  Procedure Laterality Date   BREAST BIOPSY Left 2017   DILITATION & CURRETTAGE/HYSTROSCOPY WITH NOVASURE ABLATION N/A 02/12/2022   Procedure: /HYSTEROSCOPY, POLYP RESECTION WITH MYOSURE, NOVASURE ABLATION;  Surgeon: Jerene Bears, MD;  Location: [facility];  Service: Gynecology;  Laterality: N/A;   MASS EXCISION Left 01/17/2016   Procedure: EXCISION BIOPSY OF MASS LEFT BUTTOCK ;  Surgeon: Romie Levee, MD;  Location: [facility];  Service: General;  Laterality: Left;   TUBAL LIGATION Bilateral 10/30/2003   w/ Bilateral Salpingectomy   WISDOM TOOTH EXTRACTION  2017   lft side    WISDOM TOOTH EXTRACTION Right 09/22/2022    Social History   Socioeconomic History   Marital status: Married    Spouse name: Not on file   Number of children: Not on file   Years of education: Not on file   Highest education level: 12th grade  Occupational History   Not on file  Tobacco Use   Smoking status: Never   Smokeless tobacco: Never  Vaping Use   Vaping status: Never Used  Substance and Sexual Activity   Alcohol use: Yes    Comment: occasional   Drug use: No   Sexual activity: Not on file  Other Topics Concern   Not on file  Social History Narrative   Married   Museum/gallery conservator / CNA   Social Drivers of Health   Financial Resource Strain: Low Risk  (11/16/2023)   Overall Financial Resource Strain (CARDIA)    Difficulty of Paying Living Expenses: Not hard at all  Food Insecurity: No Food Insecurity (11/16/2023)   Hunger Vital Sign    Worried About Running Out of Food in the Last Year: Never true    Ran Out of Food in the Last Year: Never true  Transportation Needs: No Transportation Needs (11/16/2023)   PRAPARE - Administrator, Civil Service (Medical): No    Lack of Transportation (Non-Medical): No  Physical Activity: Insufficiently Active (11/16/2023)   Exercise Vital Sign    Days of Exercise per Week: 3 days    Minutes of Exercise per Session: 30 min  Stress: No Stress Concern Present (11/16/2023)   [employer] of Occupational Health - Occupational Stress Questionnaire    Feeling of Stress : Only a little  Social Connections: Moderately Isolated (11/16/2023)   Social Connection and  Isolation Panel [NHANES]    Frequency of Communication with Friends and Family: More than three times a week    Frequency of Social Gatherings with Friends and Family: More than three times a week    Attends Religious Services: More than 4 times per year    Active Member of [employer] or Organizations: No    Attends Engineer, structural: Not on file    Marital Status: Separated  Intimate Partner Violence: Not on file    Family History  Problem Relation Age of Onset   Diabetes Mother    Heart disease Mother    Hypertension Mother    Stroke Mother    Deep vein thrombosis Mother    Cancer Father    Hypertension Father    Deep vein thrombosis Sister    Colon polyps Maternal Uncle  Colon cancer Neg Hx    Esophageal cancer Neg Hx    Rectal cancer Neg Hx    Stomach cancer Neg Hx      Review of Systems  Constitutional: Negative.  Negative for chills and fever.  HENT: Negative.  Negative for congestion and sore throat.   Respiratory: Negative.  Negative for cough and shortness of breath.   Cardiovascular: Negative.  Negative for chest pain and palpitations.  Gastrointestinal:  Negative for abdominal pain, diarrhea, nausea and vomiting.  Genitourinary: Negative.  Negative for dysuria and hematuria.  Skin: Negative.  Negative for rash.  Neurological: Negative.  Negative for dizziness and headaches.  All other systems reviewed and are negative.   Vitals:   11/17/23 1304  BP: (!) 134/102  Pulse: 74  Temp: 98.5 F (36.9 C)  SpO2: 96%    Physical Exam Vitals reviewed.  Constitutional:      Appearance: Normal appearance.  HENT:     Head: Normocephalic.  Eyes:     Extraocular Movements: Extraocular movements intact.     Pupils: Pupils are equal, round, and reactive to light.  Cardiovascular:     Rate and Rhythm: Normal rate and regular rhythm.     Pulses: Normal pulses.     Heart sounds: Normal heart sounds.  Pulmonary:     Effort: Pulmonary effort is normal.      Breath sounds: Normal breath sounds.  Musculoskeletal:     Cervical back: No tenderness.  Lymphadenopathy:     Cervical: No cervical adenopathy.  Skin:    General: Skin is warm and dry.     Capillary Refill: Capillary refill takes less than 2 seconds.  Neurological:     General: No focal deficit present.     Mental Status: She is alert and oriented to person, place, and time.  Psychiatric:        Mood and Affect: Mood normal.        Behavior: Behavior normal.      ASSESSMENT & PLAN: A total of 45 minutes was spent with the patient and counseling/coordination of care regarding preparing for this visit, review of most recent office visit notes, review of multiple chronic medical conditions and their management, cardiovascular risks associated with uncontrolled hypertension, review of all medications, review of most recent bloodwork results, review of health maintenance items, education on nutrition, prognosis, documentation, and need for follow up.   Problem List Items Addressed This Visit       Cardiovascular and Mediastinum   Essential hypertension - Primary   Elevated blood pressure reading in the office today Has been off medication for 4 days Cardiovascular risks associated with uncontrolled hypertension discussed Continue amlodipine-valsartan 10-320 mg daily along with hydrochlorothiazide 25 mg daily Dietary approaches to stop hypertension discussed Benefits of exercise discussed Blood work done today Follow-up in 6 months      Relevant Medications   amLODipine-valsartan (EXFORGE) 10-320 MG tablet   hydrochlorothiazide (HYDRODIURIL) 25 MG tablet   Other Relevant Orders   CBC with Differential/Platelet   Comprehensive metabolic panel   Hemoglobin A1c   Lipid panel     Other   Seasonal allergies   Relevant Medications   cetirizine (ZYRTEC) 10 MG tablet   montelukast (SINGULAIR) 10 MG tablet   Class 1 obesity due to excess calories with serious comorbidity and body  mass index (BMI) of 34.0 to 34.9 in adult   Diet and nutrition discussed Advised to decrease amount of daily carbohydrate intake and daily calories and  increase amount of plant-based protein in her diet Benefits of exercise discussed      Patient Instructions  Hypertension, Adult High blood pressure (hypertension) is when the force of blood pumping through the arteries is too strong. The arteries are the blood vessels that carry blood from the heart throughout the body. Hypertension forces the heart to work harder to pump blood and may cause arteries to become narrow or stiff. Untreated or uncontrolled hypertension can lead to a heart attack, heart failure, a stroke, kidney disease, and other problems. A blood pressure reading consists of a higher number over a lower number. Ideally, your blood pressure should be below 120/80. The first ("top") number is called the systolic pressure. It is a measure of the pressure in your arteries as your heart beats. The second ("bottom") number is called the diastolic pressure. It is a measure of the pressure in your arteries as the heart relaxes. What are the causes? The exact cause of this condition is not known. There are some conditions that result in high blood pressure. What increases the risk? Certain factors may make you more likely to develop high blood pressure. Some of these risk factors are under your control, including: Smoking. Not getting enough exercise or physical activity. Being overweight. Having too much fat, sugar, calories, or salt (sodium) in your diet. Drinking too much alcohol. Other risk factors include: Having a personal history of heart disease, diabetes, high cholesterol, or kidney disease. Stress. Having a family history of high blood pressure and high cholesterol. Having obstructive sleep apnea. Age. The risk increases with age. What are the signs or symptoms? High blood pressure may not cause symptoms. Very high blood  pressure (hypertensive crisis) may cause: Headache. Fast or irregular heartbeats (palpitations). Shortness of breath. Nosebleed. Nausea and vomiting. Vision changes. Severe chest pain, dizziness, and seizures. How is this diagnosed? This condition is diagnosed by measuring your blood pressure while you are seated, with your arm resting on a flat surface, your legs uncrossed, and your feet flat on the floor. The cuff of the blood pressure monitor will be placed directly against the skin of your upper arm at the level of your heart. Blood pressure should be measured at least twice using the same arm. Certain conditions can cause a difference in blood pressure between your right and left arms. If you have a high blood pressure reading during one visit or you have normal blood pressure with other risk factors, you may be asked to: Return on a different day to have your blood pressure checked again. Monitor your blood pressure at home for 1 week or longer. If you are diagnosed with hypertension, you may have other blood or imaging tests to help your health care provider understand your overall risk for other conditions. How is this treated? This condition is treated by making healthy lifestyle changes, such as eating healthy foods, exercising more, and reducing your alcohol intake. You may be referred for counseling on a healthy diet and physical activity. Your health care provider may prescribe medicine if lifestyle changes are not enough to get your blood pressure under control and if: Your systolic blood pressure is above 130. Your diastolic blood pressure is above 80. Your personal target blood pressure may vary depending on your medical conditions, your age, and other factors. Follow these instructions at home: Eating and drinking  Eat a diet that is high in fiber and potassium, and low in sodium, added sugar, and fat. An example of this  eating plan is called the DASH diet. DASH stands for  Dietary Approaches to Stop Hypertension. To eat this way: Eat plenty of fresh fruits and vegetables. Try to fill one half of your plate at each meal with fruits and vegetables. Eat whole grains, such as whole-wheat pasta, brown rice, or whole-grain bread. Fill about one fourth of your plate with whole grains. Eat or drink low-fat dairy products, such as skim milk or low-fat yogurt. Avoid fatty cuts of meat, processed or cured meats, and poultry with skin. Fill about one fourth of your plate with lean proteins, such as fish, chicken without skin, beans, eggs, or tofu. Avoid pre-made and processed foods. These tend to be higher in sodium, added sugar, and fat. Reduce your daily sodium intake. Many people with hypertension should eat less than 1,500 mg of sodium a day. Do not drink alcohol if: Your health care provider tells you not to drink. You are pregnant, may be pregnant, or are planning to become pregnant. If you drink alcohol: Limit how much you have to: 0-1 drink a day for women. 0-2 drinks a day for men. Know how much alcohol is in your drink. In the [place] one drink equals one 12 oz bottle of beer (355 mL), one 5 oz glass of wine (148 mL), or one 1 oz glass of hard liquor (44 mL). Lifestyle  Work with your health care provider to maintain a healthy body weight or to lose weight. Ask what an ideal weight is for you. Get at least 30 minutes of exercise that causes your heart to beat faster (aerobic exercise) most days of the week. Activities may include walking, swimming, or biking. Include exercise to strengthen your muscles (resistance exercise), such as Pilates or lifting weights, as part of your weekly exercise routine. Try to do these types of exercises for 30 minutes at least 3 days a week. Do not use any products that contain nicotine or tobacco. These products include cigarettes, chewing tobacco, and vaping devices, such as e-cigarettes. If you need help quitting, ask your health  care provider. Monitor your blood pressure at home as told by your health care provider. Keep all follow-up visits. This is important. Medicines Take over-the-counter and prescription medicines only as told by your health care provider. Follow directions carefully. Blood pressure medicines must be taken as prescribed. Do not skip doses of blood pressure medicine. Doing this puts you at risk for problems and can make the medicine less effective. Ask your health care provider about side effects or reactions to medicines that you should watch for. Contact a health care provider if you: Think you are having a reaction to a medicine you are taking. Have headaches that keep coming back (recurring). Feel dizzy. Have swelling in your ankles. Have trouble with your vision. Get help right away if you: Develop a severe headache or confusion. Have unusual weakness or numbness. Feel faint. Have severe pain in your chest or abdomen. Vomit repeatedly. Have trouble breathing. These symptoms may be an emergency. Get help right away. Call 911. Do not wait to see if the symptoms will go away. Do not drive yourself to the hospital. Summary Hypertension is when the force of blood pumping through your arteries is too strong. If this condition is not controlled, it may put you at risk for serious complications. Your personal target blood pressure may vary depending on your medical conditions, your age, and other factors. For most people, a normal blood pressure is less than 120/80.  Hypertension is treated with lifestyle changes, medicines, or a combination of both. Lifestyle changes include losing weight, eating a healthy, low-sodium diet, exercising more, and limiting alcohol. This information is not intended to replace advice given to you by your health care provider. Make sure you discuss any questions you have with your health care provider. Document Revised: 07/09/2021 Document Reviewed:  07/09/2021 Elsevier Patient Education  2024 Elsevier Inc.     Edwina Barth, MD [facility]

## 2023-11-17 NOTE — Assessment & Plan Note (Signed)
Diet and nutrition discussed. Advised to decrease amount of daily carbohydrate intake and daily calories and increase amount of plant based protein in her diet Benefits of exercise discussed.

## 2023-11-17 NOTE — Assessment & Plan Note (Signed)
 Elevated blood pressure reading in the office today Has been off medication for 4 days Cardiovascular risks associated with uncontrolled hypertension discussed Continue amlodipine-valsartan 10-320 mg daily along with hydrochlorothiazide 25 mg daily Dietary approaches to stop hypertension discussed Benefits of exercise discussed Blood work done today Follow-up in 6 months

## 2023-11-17 NOTE — Patient Instructions (Signed)
 Hypertension, Adult High blood pressure (hypertension) is when the force of blood pumping through the arteries is too strong. The arteries are the blood vessels that carry blood from the heart throughout the body. Hypertension forces the heart to work harder to pump blood and may cause arteries to become narrow or stiff. Untreated or uncontrolled hypertension can lead to a heart attack, heart failure, a stroke, kidney disease, and other problems. A blood pressure reading consists of a higher number over a lower number. Ideally, your blood pressure should be below 120/80. The first ("top") number is called the systolic pressure. It is a measure of the pressure in your arteries as your heart beats. The second ("bottom") number is called the diastolic pressure. It is a measure of the pressure in your arteries as the heart relaxes. What are the causes? The exact cause of this condition is not known. There are some conditions that result in high blood pressure. What increases the risk? Certain factors may make you more likely to develop high blood pressure. Some of these risk factors are under your control, including: Smoking. Not getting enough exercise or physical activity. Being overweight. Having too much fat, sugar, calories, or salt (sodium) in your diet. Drinking too much alcohol. Other risk factors include: Having a personal history of heart disease, diabetes, high cholesterol, or kidney disease. Stress. Having a family history of high blood pressure and high cholesterol. Having obstructive sleep apnea. Age. The risk increases with age. What are the signs or symptoms? High blood pressure may not cause symptoms. Very high blood pressure (hypertensive crisis) may cause: Headache. Fast or irregular heartbeats (palpitations). Shortness of breath. Nosebleed. Nausea and vomiting. Vision changes. Severe chest pain, dizziness, and seizures. How is this diagnosed? This condition is diagnosed by  measuring your blood pressure while you are seated, with your arm resting on a flat surface, your legs uncrossed, and your feet flat on the floor. The cuff of the blood pressure monitor will be placed directly against the skin of your upper arm at the level of your heart. Blood pressure should be measured at least twice using the same arm. Certain conditions can cause a difference in blood pressure between your right and left arms. If you have a high blood pressure reading during one visit or you have normal blood pressure with other risk factors, you may be asked to: Return on a different day to have your blood pressure checked again. Monitor your blood pressure at home for 1 week or longer. If you are diagnosed with hypertension, you may have other blood or imaging tests to help your health care provider understand your overall risk for other conditions. How is this treated? This condition is treated by making healthy lifestyle changes, such as eating healthy foods, exercising more, and reducing your alcohol intake. You may be referred for counseling on a healthy diet and physical activity. Your health care provider may prescribe medicine if lifestyle changes are not enough to get your blood pressure under control and if: Your systolic blood pressure is above 130. Your diastolic blood pressure is above 80. Your personal target blood pressure may vary depending on your medical conditions, your age, and other factors. Follow these instructions at home: Eating and drinking  Eat a diet that is high in fiber and potassium, and low in sodium, added sugar, and fat. An example of this eating plan is called the DASH diet. DASH stands for Dietary Approaches to Stop Hypertension. To eat this way: Eat  plenty of fresh fruits and vegetables. Try to fill one half of your plate at each meal with fruits and vegetables. Eat whole grains, such as whole-wheat pasta, brown rice, or whole-grain bread. Fill about one  fourth of your plate with whole grains. Eat or drink low-fat dairy products, such as skim milk or low-fat yogurt. Avoid fatty cuts of meat, processed or cured meats, and poultry with skin. Fill about one fourth of your plate with lean proteins, such as fish, chicken without skin, beans, eggs, or tofu. Avoid pre-made and processed foods. These tend to be higher in sodium, added sugar, and fat. Reduce your daily sodium intake. Many people with hypertension should eat less than 1,500 mg of sodium a day. Do not drink alcohol if: Your health care provider tells you not to drink. You are pregnant, may be pregnant, or are planning to become pregnant. If you drink alcohol: Limit how much you have to: 0-1 drink a day for women. 0-2 drinks a day for men. Know how much alcohol is in your drink. In the [place] one drink equals one 12 oz bottle of beer (355 mL), one 5 oz glass of wine (148 mL), or one 1 oz glass of hard liquor (44 mL). Lifestyle  Work with your health care provider to maintain a healthy body weight or to lose weight. Ask what an ideal weight is for you. Get at least 30 minutes of exercise that causes your heart to beat faster (aerobic exercise) most days of the week. Activities may include walking, swimming, or biking. Include exercise to strengthen your muscles (resistance exercise), such as Pilates or lifting weights, as part of your weekly exercise routine. Try to do these types of exercises for 30 minutes at least 3 days a week. Do not use any products that contain nicotine or tobacco. These products include cigarettes, chewing tobacco, and vaping devices, such as e-cigarettes. If you need help quitting, ask your health care provider. Monitor your blood pressure at home as told by your health care provider. Keep all follow-up visits. This is important. Medicines Take over-the-counter and prescription medicines only as told by your health care provider. Follow directions carefully. Blood  pressure medicines must be taken as prescribed. Do not skip doses of blood pressure medicine. Doing this puts you at risk for problems and can make the medicine less effective. Ask your health care provider about side effects or reactions to medicines that you should watch for. Contact a health care provider if you: Think you are having a reaction to a medicine you are taking. Have headaches that keep coming back (recurring). Feel dizzy. Have swelling in your ankles. Have trouble with your vision. Get help right away if you: Develop a severe headache or confusion. Have unusual weakness or numbness. Feel faint. Have severe pain in your chest or abdomen. Vomit repeatedly. Have trouble breathing. These symptoms may be an emergency. Get help right away. Call 911. Do not wait to see if the symptoms will go away. Do not drive yourself to the hospital. Summary Hypertension is when the force of blood pumping through your arteries is too strong. If this condition is not controlled, it may put you at risk for serious complications. Your personal target blood pressure may vary depending on your medical conditions, your age, and other factors. For most people, a normal blood pressure is less than 120/80. Hypertension is treated with lifestyle changes, medicines, or a combination of both. Lifestyle changes include losing weight, eating a healthy,  low-sodium diet, exercising more, and limiting alcohol. This information is not intended to replace advice given to you by your health care provider. Make sure you discuss any questions you have with your health care provider. Document Revised: 07/09/2021 Document Reviewed: 07/09/2021 Elsevier Patient Education  2024 [employer].

## 2024-02-04 ENCOUNTER — Other Ambulatory Visit: Payer: Self-pay | Admitting: Emergency Medicine

## 2024-05-19 ENCOUNTER — Ambulatory Visit: Admitting: Emergency Medicine

## 2024-05-19 ENCOUNTER — Encounter: Payer: Self-pay | Admitting: Emergency Medicine

## 2024-05-19 ENCOUNTER — Telehealth: Payer: Self-pay

## 2024-05-19 ENCOUNTER — Other Ambulatory Visit (HOSPITAL_COMMUNITY): Payer: Self-pay

## 2024-05-19 ENCOUNTER — Telehealth: Payer: Self-pay | Admitting: Radiology

## 2024-05-19 VITALS — BP 126/88 | HR 72 | Temp 98.4°F | Ht 63.0 in | Wt 203.0 lb

## 2024-05-19 DIAGNOSIS — I10 Essential (primary) hypertension: Secondary | ICD-10-CM

## 2024-05-19 MED ORDER — ZEPBOUND 2.5 MG/0.5ML ~~LOC~~ SOAJ: 2.5000 mg | SUBCUTANEOUS | 3 refills | Status: DC

## 2024-05-19 NOTE — Telephone Encounter (Signed)
PA for zepbound

## 2024-05-19 NOTE — Telephone Encounter (Signed)
 Pharmacy Patient Advocate Encounter   Received notification from Pt Calls Messages that prior authorization for Zepbound  2.5mg /0.69ml is required/requested.   Insurance verification completed.   The patient is insured through [employer] .   Per test claim: PA required; PA started via CoverMyMeds. KEY [id number] . Waiting for clinical questions to populate.

## 2024-05-19 NOTE — Assessment & Plan Note (Signed)
 Diet and nutrition discussed Advised to decrease amount of daily carbohydrate intake and daily calories and increase amount of plant-based protein in her diet Benefits of exercise discussed Will benefit from GLP-1 agonist as per insurance formulary Recommend Zepbound  or [facility]

## 2024-05-19 NOTE — Patient Instructions (Signed)
 Hypertension, Adult High blood pressure (hypertension) is when the force of blood pumping through the arteries is too strong. The arteries are the blood vessels that carry blood from the heart throughout the body. Hypertension forces the heart to work harder to pump blood and may cause arteries to become narrow or stiff. Untreated or uncontrolled hypertension can lead to a heart attack, heart failure, a stroke, kidney disease, and other problems. A blood pressure reading consists of a higher number over a lower number. Ideally, your blood pressure should be below 120/80. The first ("top") number is called the systolic pressure. It is a measure of the pressure in your arteries as your heart beats. The second ("bottom") number is called the diastolic pressure. It is a measure of the pressure in your arteries as the heart relaxes. What are the causes? The exact cause of this condition is not known. There are some conditions that result in high blood pressure. What increases the risk? Certain factors may make you more likely to develop high blood pressure. Some of these risk factors are under your control, including: Smoking. Not getting enough exercise or physical activity. Being overweight. Having too much fat, sugar, calories, or salt (sodium) in your diet. Drinking too much alcohol. Other risk factors include: Having a personal history of heart disease, diabetes, high cholesterol, or kidney disease. Stress. Having a family history of high blood pressure and high cholesterol. Having obstructive sleep apnea. Age. The risk increases with age. What are the signs or symptoms? High blood pressure may not cause symptoms. Very high blood pressure (hypertensive crisis) may cause: Headache. Fast or irregular heartbeats (palpitations). Shortness of breath. Nosebleed. Nausea and vomiting. Vision changes. Severe chest pain, dizziness, and seizures. How is this diagnosed? This condition is diagnosed by  measuring your blood pressure while you are seated, with your arm resting on a flat surface, your legs uncrossed, and your feet flat on the floor. The cuff of the blood pressure monitor will be placed directly against the skin of your upper arm at the level of your heart. Blood pressure should be measured at least twice using the same arm. Certain conditions can cause a difference in blood pressure between your right and left arms. If you have a high blood pressure reading during one visit or you have normal blood pressure with other risk factors, you may be asked to: Return on a different day to have your blood pressure checked again. Monitor your blood pressure at home for 1 week or longer. If you are diagnosed with hypertension, you may have other blood or imaging tests to help your health care provider understand your overall risk for other conditions. How is this treated? This condition is treated by making healthy lifestyle changes, such as eating healthy foods, exercising more, and reducing your alcohol intake. You may be referred for counseling on a healthy diet and physical activity. Your health care provider may prescribe medicine if lifestyle changes are not enough to get your blood pressure under control and if: Your systolic blood pressure is above 130. Your diastolic blood pressure is above 80. Your personal target blood pressure may vary depending on your medical conditions, your age, and other factors. Follow these instructions at home: Eating and drinking  Eat a diet that is high in fiber and potassium, and low in sodium, added sugar, and fat. An example of this eating plan is called the DASH diet. DASH stands for Dietary Approaches to Stop Hypertension. To eat this way: Eat  plenty of fresh fruits and vegetables. Try to fill one half of your plate at each meal with fruits and vegetables. Eat whole grains, such as whole-wheat pasta, brown rice, or whole-grain bread. Fill about one  fourth of your plate with whole grains. Eat or drink low-fat dairy products, such as skim milk or low-fat yogurt. Avoid fatty cuts of meat, processed or cured meats, and poultry with skin. Fill about one fourth of your plate with lean proteins, such as fish, chicken without skin, beans, eggs, or tofu. Avoid pre-made and processed foods. These tend to be higher in sodium, added sugar, and fat. Reduce your daily sodium intake. Many people with hypertension should eat less than 1,500 mg of sodium a day. Do not drink alcohol if: Your health care provider tells you not to drink. You are pregnant, may be pregnant, or are planning to become pregnant. If you drink alcohol: Limit how much you have to: 0-1 drink a day for women. 0-2 drinks a day for men. Know how much alcohol is in your drink. In the [place] one drink equals one 12 oz bottle of beer (355 mL), one 5 oz glass of wine (148 mL), or one 1 oz glass of hard liquor (44 mL). Lifestyle  Work with your health care provider to maintain a healthy body weight or to lose weight. Ask what an ideal weight is for you. Get at least 30 minutes of exercise that causes your heart to beat faster (aerobic exercise) most days of the week. Activities may include walking, swimming, or biking. Include exercise to strengthen your muscles (resistance exercise), such as Pilates or lifting weights, as part of your weekly exercise routine. Try to do these types of exercises for 30 minutes at least 3 days a week. Do not use any products that contain nicotine or tobacco. These products include cigarettes, chewing tobacco, and vaping devices, such as e-cigarettes. If you need help quitting, ask your health care provider. Monitor your blood pressure at home as told by your health care provider. Keep all follow-up visits. This is important. Medicines Take over-the-counter and prescription medicines only as told by your health care provider. Follow directions carefully. Blood  pressure medicines must be taken as prescribed. Do not skip doses of blood pressure medicine. Doing this puts you at risk for problems and can make the medicine less effective. Ask your health care provider about side effects or reactions to medicines that you should watch for. Contact a health care provider if you: Think you are having a reaction to a medicine you are taking. Have headaches that keep coming back (recurring). Feel dizzy. Have swelling in your ankles. Have trouble with your vision. Get help right away if you: Develop a severe headache or confusion. Have unusual weakness or numbness. Feel faint. Have severe pain in your chest or abdomen. Vomit repeatedly. Have trouble breathing. These symptoms may be an emergency. Get help right away. Call 911. Do not wait to see if the symptoms will go away. Do not drive yourself to the hospital. Summary Hypertension is when the force of blood pumping through your arteries is too strong. If this condition is not controlled, it may put you at risk for serious complications. Your personal target blood pressure may vary depending on your medical conditions, your age, and other factors. For most people, a normal blood pressure is less than 120/80. Hypertension is treated with lifestyle changes, medicines, or a combination of both. Lifestyle changes include losing weight, eating a healthy,  low-sodium diet, exercising more, and limiting alcohol. This information is not intended to replace advice given to you by your health care provider. Make sure you discuss any questions you have with your health care provider. Document Revised: 07/09/2021 Document Reviewed: 07/09/2021 Elsevier Patient Education  2024 [employer].

## 2024-05-19 NOTE — Assessment & Plan Note (Signed)
 BP Readings from Last 3 Encounters:  05/19/24 126/88  11/17/23 (!) 134/102  10/13/22 (!) 129/92  Well-controlled hypertension Continue amlodipine -valsartan  10-320 mg daily Cardiovascular risks associated with hypertension discussed Diet and nutrition discussed Benefits of exercise discussed Follow-up in 6 months

## 2024-05-19 NOTE — Progress Notes (Signed)
 Pamela Allen 48 y.o.   Chief Complaint  Patient presents with   Follow-up    Patient here for f/u for HTN. No other concerns    HISTORY OF PRESENT ILLNESS: This is a 48 y.o. female here for follow-up of hypertension Also concerned about her weight.  Wants to talk about GLP-1 agonists. Father recently diagnosed with pancreas cancer.  Stressful situation. No other complaints or medical concerns today.  HPI   Prior to Admission medications   Medication Sig Start Date End Date Taking? Authorizing Provider  amLODipine -valsartan  (EXFORGE ) 10-320 MG tablet Take 1 tablet by mouth daily. 11/17/23  Yes Keishana Klinger, Emil Schanz, MD  cetirizine  (ZYRTEC ) 10 MG tablet Take 1 tablet (10 mg total) by mouth daily. 11/17/23  Yes Shakir Petrosino, Emil Schanz, MD  fluconazole  (DIFLUCAN ) 150 MG tablet TAKE 1 TABLET(150 MG) BY MOUTH 1 TIME FOR 1 DOSE 02/04/24  Yes Duell Holdren, Emil Schanz, MD  montelukast  (SINGULAIR ) 10 MG tablet Take 1 tablet (10 mg total) by mouth at bedtime. 11/17/23  Yes Jillianne Gamino, Emil Schanz, MD  Multiple Vitamins-Minerals (MULTIVITAMIN WOMEN 50+ PO) Take by mouth.   Yes [provider]  Olopatadine  HCl (PATADAY ) 0.2 % SOLN Apply 1 drop to eye daily. 03/23/19  Yes Asante Ritacco, Emil Schanz, MD  tirzepatide  (ZEPBOUND ) 2.5 MG/0.5ML Pen Inject 2.5 mg into the skin once a week. Increase dose to 5 mg weekly after 3 to 4 weeks if side effects tolerated 05/19/24  Yes Vidur Knust, Emil Schanz, MD  ALPRAZolam  (XANAX ) 0.5 MG tablet TAKE 1 TABLET(0.5 MG) BY MOUTH TWICE DAILY AS NEEDED FOR ANXIETY Patient not taking: Reported on 05/19/2024 02/20/21   Allyah Heather Jose, MD  amoxicillin -clavulanate (AUGMENTIN ) 875-125 MG tablet Take 1 tablet by mouth every 12 (twelve) hours. Patient not taking: Reported on 05/19/2024 10/13/22   Emil Share, DO  escitalopram  (LEXAPRO ) 20 MG tablet TAKE 1 TABLET(20 MG) BY MOUTH DAILY Patient not taking: Reported on 05/19/2024 09/30/23   Dorr Perrot Jose, MD  Fluticasone-Umeclidin-Vilant  (TRELEGY ELLIPTA ) 100-62.5-25 MCG/ACT AEPB Inhale 1 puff into the lungs daily. Patient not taking: Reported on 05/19/2024 07/16/21   Plotnikov, Karlynn GAILS, MD  ibuprofen  (ADVIL ) 800 MG tablet Take 1 tablet (800 mg total) by mouth every 8 (eight) hours as needed. Patient not taking: Reported on 05/19/2024 03/14/22   Cleotilde Ronal RAMAN, MD  norethindrone  (AYGESTIN ) 5 MG tablet Take 1 tablet (5 mg total) by mouth daily. 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;  Service: Gynecology;  Laterality: N/A;   MASS EXCISION Left 01/17/2016   Procedure: EXCISION BIOPSY OF MASS LEFT BUTTOCK ;  Surgeon: Bernarda Ned, MD;  Location: [facility];  Service: General;  Laterality: Left;   TUBAL LIGATION Bilateral 10/30/2003   w/ Bilateral Salpingectomy   WISDOM TOOTH EXTRACTION  2017   lft side    WISDOM TOOTH  EXTRACTION Right 09/22/2022    Social History   Socioeconomic History   Marital status: Married    Spouse name: Not on file   Number of children: Not on file   Years of education: Not on file   Highest education level: 12th grade  Occupational History   Not on file  Tobacco Use   Smoking status: Never   Smokeless tobacco: Never  Vaping Use   Vaping status: Never Used  Substance and Sexual Activity   Alcohol use: Yes    Comment: occasional   Drug use: No   Sexual activity: Not on file  Other Topics Concern   Not on file  Social History Narrative   Married   Museum/gallery conservator / CNA   Social Drivers of Health   Financial Resource Strain: Low Risk  (05/18/2024)   Overall Financial Resource Strain (CARDIA)    Difficulty of Paying Living Expenses: Not hard at all  Food Insecurity: No Food Insecurity (05/18/2024)   Hunger Vital Sign    Worried About Running Out of Food in the Last Year: Never true    Ran Out of Food in the Last Year: Never true  Transportation Needs: No Transportation Needs (05/18/2024)   PRAPARE - Administrator, Civil Service (Medical): No    Lack of Transportation (Non-Medical): No  Physical Activity: Insufficiently Active (05/18/2024)   Exercise Vital Sign    Days of Exercise per Week: 3 days    Minutes of Exercise per Session: 30 min  Stress: No Stress Concern Present (05/18/2024)   [employer] of Occupational Health - Occupational Stress Questionnaire    Feeling of Stress: Only a little  Social Connections: Moderately Isolated (05/18/2024)   Social Connection and Isolation Panel    Frequency of Communication with Friends and Family: More than three times a week    Frequency of Social Gatherings with Friends and Family: Once a week    Attends Religious Services: More than 4 times per year    Active Member of [employer] or Organizations: No    Attends Engineer, structural: Not on file    Marital Status: Separated  Intimate  Partner Violence: Not on file    Family History  Problem Relation Age of Onset   Diabetes Mother    Heart disease Mother    Hypertension Mother    Stroke Mother    Deep vein thrombosis Mother    Cancer Father    Hypertension Father    Deep vein thrombosis Sister    Colon polyps Maternal Uncle    Colon cancer Neg Hx    Esophageal cancer Neg Hx    Rectal cancer Neg Hx    Stomach cancer Neg Hx      Review of Systems  Constitutional: Negative.  Negative for chills and fever.  HENT: Negative.  Negative for congestion and sore throat.   Respiratory: Negative.  Negative for cough and shortness of breath.   Cardiovascular:  Negative.  Negative for chest pain and palpitations.  Gastrointestinal:  Negative for abdominal pain, diarrhea, nausea and vomiting.  Genitourinary: Negative.  Negative for dysuria and hematuria.  Skin: Negative.  Negative for rash.  Neurological: Negative.  Negative for dizziness and headaches.  All other systems reviewed and are negative.   Vitals:   05/19/24 1036  BP: 126/88  Pulse: 72  Temp: 98.4 F (36.9 C)  SpO2: 98%    Physical Exam Vitals reviewed.  Constitutional:      Appearance: Normal appearance.  HENT:     Head: Normocephalic.  Eyes:     Extraocular Movements: Extraocular movements intact.  Cardiovascular:     Rate and Rhythm: Normal rate and regular rhythm.     Pulses: Normal pulses.     Heart sounds: Normal heart sounds.  Pulmonary:     Effort: Pulmonary effort is normal.     Breath sounds: Normal breath sounds.  Musculoskeletal:     Cervical back: No tenderness.  Lymphadenopathy:     Cervical: No cervical adenopathy.  Skin:    General: Skin is warm and dry.     Capillary Refill: Capillary refill takes less than 2 seconds.  Neurological:     General: No focal deficit present.     Mental Status: She is alert and oriented to person, place, and time.  Psychiatric:        Mood and Affect: Mood normal.        Behavior: Behavior  normal.      ASSESSMENT & PLAN: A total of 40 minutes was spent with the patient and counseling/coordination of care regarding preparing for this visit, review of most recent office visit notes, review of multiple chronic medical conditions and their management, cardiovascular risks associated with hypertension, review of all medications and instructions on how to use GLP-1 agonists and side effects, review of most recent bloodwork results, review of health maintenance items, education on nutrition, prognosis, documentation, and need for follow up.   Problem List Items Addressed This Visit       Cardiovascular and Mediastinum   Essential hypertension - Primary   BP Readings from Last 3 Encounters:  05/19/24 126/88  11/17/23 (!) 134/102  10/13/22 (!) 129/92  Well-controlled hypertension Continue amlodipine -valsartan  10-320 mg daily Cardiovascular risks associated with hypertension discussed Diet and nutrition discussed Benefits of exercise discussed Follow-up in 6 months         Other   Morbid obesity due to excess calories (HCC)   Diet and nutrition discussed Advised to decrease amount of daily carbohydrate intake and daily calories and increase amount of plant-based protein in her diet Benefits of exercise discussed Will benefit from GLP-1 agonist as per insurance formulary Recommend Zepbound  or Wegovy      Relevant Medications   tirzepatide  (ZEPBOUND ) 2.5 MG/0.5ML Pen   Patient Instructions  Hypertension, Adult High blood pressure (hypertension) is when the force of blood pumping through the arteries is too strong. The arteries are the blood vessels that carry blood from the heart throughout the body. Hypertension forces the heart to work harder to pump blood and may cause arteries to become narrow or stiff. Untreated or uncontrolled hypertension can lead to a heart attack, heart failure, a stroke, kidney disease, and other problems. A blood pressure reading consists of a  higher number over a lower number. Ideally, your blood pressure should be below 120/80. The first (top) number is called the systolic pressure. It is a measure of the pressure in your  arteries as your heart beats. The second (bottom) number is called the diastolic pressure. It is a measure of the pressure in your arteries as the heart relaxes. What are the causes? The exact cause of this condition is not known. There are some conditions that result in high blood pressure. What increases the risk? Certain factors may make you more likely to develop high blood pressure. Some of these risk factors are under your control, including: Smoking. Not getting enough exercise or physical activity. Being overweight. Having too much fat, sugar, calories, or salt (sodium) in your diet. Drinking too much alcohol. Other risk factors include: Having a personal history of heart disease, diabetes, high cholesterol, or kidney disease. Stress. Having a family history of high blood pressure and high cholesterol. Having obstructive sleep apnea. Age. The risk increases with age. What are the signs or symptoms? High blood pressure may not cause symptoms. Very high blood pressure (hypertensive crisis) may cause: Headache. Fast or irregular heartbeats (palpitations). Shortness of breath. Nosebleed. Nausea and vomiting. Vision changes. Severe chest pain, dizziness, and seizures. How is this diagnosed? This condition is diagnosed by measuring your blood pressure while you are seated, with your arm resting on a flat surface, your legs uncrossed, and your feet flat on the floor. The cuff of the blood pressure monitor will be placed directly against the skin of your upper arm at the level of your heart. Blood pressure should be measured at least twice using the same arm. Certain conditions can cause a difference in blood pressure between your right and left arms. If you have a high blood pressure reading during one  visit or you have normal blood pressure with other risk factors, you may be asked to: Return on a different day to have your blood pressure checked again. Monitor your blood pressure at home for 1 week or longer. If you are diagnosed with hypertension, you may have other blood or imaging tests to help your health care provider understand your overall risk for other conditions. How is this treated? This condition is treated by making healthy lifestyle changes, such as eating healthy foods, exercising more, and reducing your alcohol intake. You may be referred for counseling on a healthy diet and physical activity. Your health care provider may prescribe medicine if lifestyle changes are not enough to get your blood pressure under control and if: Your systolic blood pressure is above 130. Your diastolic blood pressure is above 80. Your personal target blood pressure may vary depending on your medical conditions, your age, and other factors. Follow these instructions at home: Eating and drinking  Eat a diet that is high in fiber and potassium, and low in sodium, added sugar, and fat. An example of this eating plan is called the DASH diet. DASH stands for Dietary Approaches to Stop Hypertension. To eat this way: Eat plenty of fresh fruits and vegetables. Try to fill one half of your plate at each meal with fruits and vegetables. Eat whole grains, such as whole-wheat pasta, brown rice, or whole-grain bread. Fill about one fourth of your plate with whole grains. Eat or drink low-fat dairy products, such as skim milk or low-fat yogurt. Avoid fatty cuts of meat, processed or cured meats, and poultry with skin. Fill about one fourth of your plate with lean proteins, such as fish, chicken without skin, beans, eggs, or tofu. Avoid pre-made and processed foods. These tend to be higher in sodium, added sugar, and fat. Reduce your daily sodium intake. Many  people with hypertension should eat less than 1,500 mg  of sodium a day. Do not drink alcohol if: Your health care provider tells you not to drink. You are pregnant, may be pregnant, or are planning to become pregnant. If you drink alcohol: Limit how much you have to: 0-1 drink a day for women. 0-2 drinks a day for men. Know how much alcohol is in your drink. In the [place] one drink equals one 12 oz bottle of beer (355 mL), one 5 oz glass of wine (148 mL), or one 1 oz glass of hard liquor (44 mL). Lifestyle  Work with your health care provider to maintain a healthy body weight or to lose weight. Ask what an ideal weight is for you. Get at least 30 minutes of exercise that causes your heart to beat faster (aerobic exercise) most days of the week. Activities may include walking, swimming, or biking. Include exercise to strengthen your muscles (resistance exercise), such as Pilates or lifting weights, as part of your weekly exercise routine. Try to do these types of exercises for 30 minutes at least 3 days a week. Do not use any products that contain nicotine or tobacco. These products include cigarettes, chewing tobacco, and vaping devices, such as e-cigarettes. If you need help quitting, ask your health care provider. Monitor your blood pressure at home as told by your health care provider. Keep all follow-up visits. This is important. Medicines Take over-the-counter and prescription medicines only as told by your health care provider. Follow directions carefully. Blood pressure medicines must be taken as prescribed. Do not skip doses of blood pressure medicine. Doing this puts you at risk for problems and can make the medicine less effective. Ask your health care provider about side effects or reactions to medicines that you should watch for. Contact a health care provider if you: Think you are having a reaction to a medicine you are taking. Have headaches that keep coming back (recurring). Feel dizzy. Have swelling in your ankles. Have trouble  with your vision. Get help right away if you: Develop a severe headache or confusion. Have unusual weakness or numbness. Feel faint. Have severe pain in your chest or abdomen. Vomit repeatedly. Have trouble breathing. These symptoms may be an emergency. Get help right away. Call 911. Do not wait to see if the symptoms will go away. Do not drive yourself to the hospital. Summary Hypertension is when the force of blood pumping through your arteries is too strong. If this condition is not controlled, it may put you at risk for serious complications. Your personal target blood pressure may vary depending on your medical conditions, your age, and other factors. For most people, a normal blood pressure is less than 120/80. Hypertension is treated with lifestyle changes, medicines, or a combination of both. Lifestyle changes include losing weight, eating a healthy, low-sodium diet, exercising more, and limiting alcohol. This information is not intended to replace advice given to you by your health care provider. Make sure you discuss any questions you have with your health care provider. Document Revised: 07/09/2021 Document Reviewed: 07/09/2021 Elsevier Patient Education  2024 Elsevier Inc.     Emil Schaumann, MD [facility]

## 2024-05-19 NOTE — Telephone Encounter (Signed)
 Pharmacy Patient Advocate Encounter  Received notification from [facility] that Prior Authorization for Zepbound  2.5mg /0.32ml has been CANCELLED due to Zepbound  is excluded from the patient's benefit and the drug is not covered by the plan.

## 2024-05-20 ENCOUNTER — Encounter: Payer: Self-pay | Admitting: Emergency Medicine

## 2024-05-20 ENCOUNTER — Other Ambulatory Visit: Payer: Self-pay | Admitting: Radiology

## 2024-05-20 MED ORDER — ZEPBOUND 2.5 MG/0.5ML ~~LOC~~ SOAJ: 2.5000 mg | SUBCUTANEOUS | 0 refills | Status: AC

## 2024-05-25 ENCOUNTER — Other Ambulatory Visit (HOSPITAL_COMMUNITY): Payer: Self-pay

## 2024-07-22 ENCOUNTER — Other Ambulatory Visit: Payer: Self-pay | Admitting: Emergency Medicine

## 2024-07-22 DIAGNOSIS — Z1231 Encounter for screening mammogram for malignant neoplasm of breast: Secondary | ICD-10-CM

## 2024-08-11 ENCOUNTER — Other Ambulatory Visit: Payer: Self-pay | Admitting: Medical Genetics

## 2024-08-17 ENCOUNTER — Ambulatory Visit

## 2024-08-18 ENCOUNTER — Ambulatory Visit: Admitting: Emergency Medicine

## 2024-08-22 ENCOUNTER — Inpatient Hospital Stay: Admission: RE | Admit: 2024-08-22 | Discharge: 2024-08-22 | Attending: Emergency Medicine

## 2024-08-22 ENCOUNTER — Ambulatory Visit: Admitting: Emergency Medicine

## 2024-08-22 ENCOUNTER — Encounter: Payer: Self-pay | Admitting: Emergency Medicine

## 2024-08-22 VITALS — BP 124/88 | HR 88 | Temp 98.2°F | Ht 63.0 in | Wt 208.0 lb

## 2024-08-22 DIAGNOSIS — I10 Essential (primary) hypertension: Secondary | ICD-10-CM

## 2024-08-22 DIAGNOSIS — Z1231 Encounter for screening mammogram for malignant neoplasm of breast: Secondary | ICD-10-CM

## 2024-08-22 NOTE — Assessment & Plan Note (Signed)
 Diet and nutrition discussed Advised to decrease amount of daily carbohydrate intake and daily calories and increase amount of plant-based protein in her diet Benefits of exercise discussed Will benefit from GLP-1 agonist as per insurance formulary Recommend Zepbound  or [facility]

## 2024-08-22 NOTE — Assessment & Plan Note (Signed)
 BP Readings from Last 3 Encounters:  08/22/24 124/88  05/19/24 126/88  11/17/23 (!) 134/102  Well-controlled hypertension Continue amlodipine -valsartan  10-320 mg daily Cardiovascular risks associated with hypertension discussed Diet and nutrition discussed Benefits of exercise discussed Follow-up in 6 months

## 2024-08-22 NOTE — Patient Instructions (Signed)
 Hypertension, Adult High blood pressure (hypertension) is when the force of blood pumping through the arteries is too strong. The arteries are the blood vessels that carry blood from the heart throughout the body. Hypertension forces the heart to work harder to pump blood and may cause arteries to become narrow or stiff. Untreated or uncontrolled hypertension can lead to a heart attack, heart failure, a stroke, kidney disease, and other problems. A blood pressure reading consists of a higher number over a lower number. Ideally, your blood pressure should be below 120/80. The first ("top") number is called the systolic pressure. It is a measure of the pressure in your arteries as your heart beats. The second ("bottom") number is called the diastolic pressure. It is a measure of the pressure in your arteries as the heart relaxes. What are the causes? The exact cause of this condition is not known. There are some conditions that result in high blood pressure. What increases the risk? Certain factors may make you more likely to develop high blood pressure. Some of these risk factors are under your control, including: Smoking. Not getting enough exercise or physical activity. Being overweight. Having too much fat, sugar, calories, or salt (sodium) in your diet. Drinking too much alcohol. Other risk factors include: Having a personal history of heart disease, diabetes, high cholesterol, or kidney disease. Stress. Having a family history of high blood pressure and high cholesterol. Having obstructive sleep apnea. Age. The risk increases with age. What are the signs or symptoms? High blood pressure may not cause symptoms. Very high blood pressure (hypertensive crisis) may cause: Headache. Fast or irregular heartbeats (palpitations). Shortness of breath. Nosebleed. Nausea and vomiting. Vision changes. Severe chest pain, dizziness, and seizures. How is this diagnosed? This condition is diagnosed by  measuring your blood pressure while you are seated, with your arm resting on a flat surface, your legs uncrossed, and your feet flat on the floor. The cuff of the blood pressure monitor will be placed directly against the skin of your upper arm at the level of your heart. Blood pressure should be measured at least twice using the same arm. Certain conditions can cause a difference in blood pressure between your right and left arms. If you have a high blood pressure reading during one visit or you have normal blood pressure with other risk factors, you may be asked to: Return on a different day to have your blood pressure checked again. Monitor your blood pressure at home for 1 week or longer. If you are diagnosed with hypertension, you may have other blood or imaging tests to help your health care provider understand your overall risk for other conditions. How is this treated? This condition is treated by making healthy lifestyle changes, such as eating healthy foods, exercising more, and reducing your alcohol intake. You may be referred for counseling on a healthy diet and physical activity. Your health care provider may prescribe medicine if lifestyle changes are not enough to get your blood pressure under control and if: Your systolic blood pressure is above 130. Your diastolic blood pressure is above 80. Your personal target blood pressure may vary depending on your medical conditions, your age, and other factors. Follow these instructions at home: Eating and drinking  Eat a diet that is high in fiber and potassium, and low in sodium, added sugar, and fat. An example of this eating plan is called the DASH diet. DASH stands for Dietary Approaches to Stop Hypertension. To eat this way: Eat  plenty of fresh fruits and vegetables. Try to fill one half of your plate at each meal with fruits and vegetables. Eat whole grains, such as whole-wheat pasta, brown rice, or whole-grain bread. Fill about one  fourth of your plate with whole grains. Eat or drink low-fat dairy products, such as skim milk or low-fat yogurt. Avoid fatty cuts of meat, processed or cured meats, and poultry with skin. Fill about one fourth of your plate with lean proteins, such as fish, chicken without skin, beans, eggs, or tofu. Avoid pre-made and processed foods. These tend to be higher in sodium, added sugar, and fat. Reduce your daily sodium intake. Many people with hypertension should eat less than 1,500 mg of sodium a day. Do not drink alcohol if: Your health care provider tells you not to drink. You are pregnant, may be pregnant, or are planning to become pregnant. If you drink alcohol: Limit how much you have to: 0-1 drink a day for women. 0-2 drinks a day for men. Know how much alcohol is in your drink. In the [place] one drink equals one 12 oz bottle of beer (355 mL), one 5 oz glass of wine (148 mL), or one 1 oz glass of hard liquor (44 mL). Lifestyle  Work with your health care provider to maintain a healthy body weight or to lose weight. Ask what an ideal weight is for you. Get at least 30 minutes of exercise that causes your heart to beat faster (aerobic exercise) most days of the week. Activities may include walking, swimming, or biking. Include exercise to strengthen your muscles (resistance exercise), such as Pilates or lifting weights, as part of your weekly exercise routine. Try to do these types of exercises for 30 minutes at least 3 days a week. Do not use any products that contain nicotine or tobacco. These products include cigarettes, chewing tobacco, and vaping devices, such as e-cigarettes. If you need help quitting, ask your health care provider. Monitor your blood pressure at home as told by your health care provider. Keep all follow-up visits. This is important. Medicines Take over-the-counter and prescription medicines only as told by your health care provider. Follow directions carefully. Blood  pressure medicines must be taken as prescribed. Do not skip doses of blood pressure medicine. Doing this puts you at risk for problems and can make the medicine less effective. Ask your health care provider about side effects or reactions to medicines that you should watch for. Contact a health care provider if you: Think you are having a reaction to a medicine you are taking. Have headaches that keep coming back (recurring). Feel dizzy. Have swelling in your ankles. Have trouble with your vision. Get help right away if you: Develop a severe headache or confusion. Have unusual weakness or numbness. Feel faint. Have severe pain in your chest or abdomen. Vomit repeatedly. Have trouble breathing. These symptoms may be an emergency. Get help right away. Call 911. Do not wait to see if the symptoms will go away. Do not drive yourself to the hospital. Summary Hypertension is when the force of blood pumping through your arteries is too strong. If this condition is not controlled, it may put you at risk for serious complications. Your personal target blood pressure may vary depending on your medical conditions, your age, and other factors. For most people, a normal blood pressure is less than 120/80. Hypertension is treated with lifestyle changes, medicines, or a combination of both. Lifestyle changes include losing weight, eating a healthy,  low-sodium diet, exercising more, and limiting alcohol. This information is not intended to replace advice given to you by your health care provider. Make sure you discuss any questions you have with your health care provider. Document Revised: 07/09/2021 Document Reviewed: 07/09/2021 Elsevier Patient Education  2024 [employer].

## 2024-08-22 NOTE — Progress Notes (Signed)
 Pamela Allen 48 y.o.   Chief Complaint  Patient presents with   Follow-up    HISTORY OF PRESENT ILLNESS: This is a 48 y.o. female here for follow-up of chronic medical conditions including hypertension Overall doing well.  Has no complaints or medical concerns today. Wt Readings from Last 3 Encounters:  08/22/24 208 lb (94.3 kg)  05/19/24 203 lb (92.1 kg)  11/17/23 198 lb (89.8 kg)     HPI   Prior to Admission medications   Medication Sig Start Date End Date Taking? Authorizing Provider  amLODipine -valsartan  (EXFORGE ) 10-320 MG tablet Take 1 tablet by mouth daily. 11/17/23  Yes Pattye Meda, Emil Schanz, MD  cetirizine  (ZYRTEC ) 10 MG tablet Take 1 tablet (10 mg total) by mouth daily. 11/17/23  Yes Soila Printup, Emil Schanz, MD  fluconazole  (DIFLUCAN ) 150 MG tablet TAKE 1 TABLET(150 MG) BY MOUTH 1 TIME FOR 1 DOSE 02/04/24  Yes Nike Southwell, Emil Schanz, MD  montelukast  (SINGULAIR ) 10 MG tablet Take 1 tablet (10 mg total) by mouth at bedtime. 11/17/23  Yes Taneeka Curtner, Emil Schanz, MD  Multiple Vitamins-Minerals (MULTIVITAMIN WOMEN 50+ PO) Take by mouth.   Yes [provider]  Olopatadine  HCl (PATADAY ) 0.2 % SOLN Apply 1 drop to eye daily. 03/23/19  Yes Crysten Kaman, Emil Schanz, MD  tirzepatide  (ZEPBOUND ) 2.5 MG/0.5ML Pen Inject 2.5 mg into the skin once a week. Increase dose to 5 mg weekly after 3 to 4 weeks if side effects tolerated 05/20/24  Yes Doreatha Offer, Emil Schanz, MD  ALPRAZolam  (XANAX ) 0.5 MG tablet TAKE 1 TABLET(0.5 MG) BY MOUTH TWICE DAILY AS NEEDED FOR ANXIETY Patient not taking: Reported on 08/22/2024 02/20/21   Herchel Hopkin Jose, MD  amoxicillin -clavulanate (AUGMENTIN ) 875-125 MG tablet Take 1 tablet by mouth every 12 (twelve) hours. Patient not taking: Reported on 08/22/2024 10/13/22   Emil Share, DO  escitalopram  (LEXAPRO ) 20 MG tablet TAKE 1 TABLET(20 MG) BY MOUTH DAILY Patient not taking: Reported on 08/22/2024 09/30/23   Lynde Ludwig Jose, MD  Fluticasone-Umeclidin-Vilant (TRELEGY  ELLIPTA) 100-62.5-25 MCG/ACT AEPB Inhale 1 puff into the lungs daily. Patient not taking: Reported on 08/22/2024 07/16/21   Plotnikov, Aleksei V, MD  ibuprofen  (ADVIL ) 800 MG tablet Take 1 tablet (800 mg total) by mouth every 8 (eight) hours as needed. Patient not taking: Reported on 08/22/2024 03/14/22   Cleotilde Ronal RAMAN, MD  norethindrone  (AYGESTIN ) 5 MG tablet Take 1 tablet (5 mg total) by mouth daily. 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   Vitamin D  deficiency 11/11/2014   Essential hypertension 11/21/2011    Past Medical History:  Diagnosis Date   Allergy    Anemia    yrs ago with pregnancy 28 yrs ago    Anxiety    Breast fibroadenoma    Hypertension     Past Surgical History:  Procedure Laterality Date   BREAST BIOPSY Left 2017   DILITATION & CURRETTAGE/HYSTROSCOPY WITH NOVASURE ABLATION  N/A 02/12/2022   Procedure: LELDON, POLYP RESECTION WITH MYOSURE, NOVASURE ABLATION;  Surgeon: Cleotilde Ronal RAMAN, MD;  Location: [facility];  Service: Gynecology;  Laterality: N/A;   MASS EXCISION Left 01/17/2016   Procedure: EXCISION BIOPSY OF MASS LEFT BUTTOCK ;  Surgeon: Bernarda Ned, MD;  Location: [facility];  Service: General;  Laterality: Left;   TUBAL LIGATION Bilateral 10/30/2003   w/ Bilateral Salpingectomy   WISDOM TOOTH EXTRACTION  2017   lft side    WISDOM TOOTH EXTRACTION  Right 09/22/2022    Social History   Socioeconomic History   Marital status: Married    Spouse name: Not on file   Number of children: Not on file   Years of education: Not on file   Highest education level: 12th grade  Occupational History   Not on file  Tobacco Use   Smoking status: Never   Smokeless tobacco: Never  Vaping Use   Vaping status: Never Used  Substance and Sexual Activity   Alcohol use: Yes    Comment: occasional   Drug use: No   Sexual activity: Not on file  Other Topics Concern   Not on file  Social History Narrative   Married   Museum/gallery Conservator / CNA   Social Drivers of Health   Financial Resource Strain: Low Risk  (05/18/2024)   Overall Financial Resource Strain (CARDIA)    Difficulty of Paying Living Expenses: Not hard at all  Food Insecurity: No Food Insecurity (05/18/2024)   Hunger Vital Sign    Worried About Running Out of Food in the Last Year: Never true    Ran Out of Food in the Last Year: Never true  Transportation Needs: No Transportation Needs (05/18/2024)   PRAPARE - Administrator, Civil Service (Medical): No    Lack of Transportation (Non-Medical): No  Physical Activity: Insufficiently Active (05/18/2024)   Exercise Vital Sign    Days of Exercise per Week: 3 days    Minutes of Exercise per Session: 30 min  Stress: No Stress Concern Present (05/18/2024)   [employer] of Occupational Health - Occupational Stress Questionnaire    Feeling of Stress: Only a little  Social Connections: Moderately Isolated (05/18/2024)   Social Connection and Isolation Panel    Frequency of Communication with Friends and Family: More than three times a week    Frequency of Social Gatherings with Friends and Family: Once a week    Attends Religious Services: More than 4 times per year    Active Member of [employer] or Organizations: No    Attends Engineer, Structural: Not on file    Marital Status: Separated  Intimate Partner  Violence: Not on file    Family History  Problem Relation Age of Onset   Diabetes Mother    Heart disease Mother    Hypertension Mother    Stroke Mother    Deep vein thrombosis Mother    Cancer Father    Hypertension Father    Deep vein thrombosis Sister    Colon polyps Maternal Uncle    Colon cancer Neg Hx    Esophageal cancer Neg Hx    Rectal cancer Neg Hx    Stomach cancer Neg Hx      Review of Systems  Constitutional: Negative.  Negative for chills and fever.  HENT: Negative.  Negative for congestion and sore throat.   Respiratory: Negative.  Negative for cough and shortness of  breath.   Cardiovascular: Negative.  Negative for chest pain and palpitations.  Gastrointestinal:  Negative for abdominal pain, diarrhea, nausea and vomiting.  Skin: Negative.  Negative for rash.  Neurological: Negative.  Negative for dizziness and headaches.  All other systems reviewed and are negative.   Vitals:   08/22/24 1044  BP: 124/88  Pulse: 88  Temp: 98.2 F (36.8 C)  SpO2: 93%    Physical Exam Vitals reviewed.  Constitutional:      Appearance: Normal appearance.  HENT:     Head: Normocephalic.     Mouth/Throat:     Mouth: Mucous membranes are moist.     Pharynx: Oropharynx is clear.  Eyes:     Extraocular Movements: Extraocular movements intact.     Conjunctiva/sclera: Conjunctivae normal.     Pupils: Pupils are equal, round, and reactive to light.  Cardiovascular:     Rate and Rhythm: Normal rate and regular rhythm.     Pulses: Normal pulses.     Heart sounds: Normal heart sounds.  Pulmonary:     Effort: Pulmonary effort is normal.     Breath sounds: Normal breath sounds.  Abdominal:     Palpations: Abdomen is soft.     Tenderness: There is no abdominal tenderness.  Musculoskeletal:     Cervical back: No tenderness.  Lymphadenopathy:     Cervical: No cervical adenopathy.  Skin:    General: Skin is warm and dry.     Capillary Refill: Capillary refill takes  less than 2 seconds.  Neurological:     General: No focal deficit present.     Mental Status: She is alert and oriented to person, place, and time.  Psychiatric:        Mood and Affect: Mood normal.        Behavior: Behavior normal.      ASSESSMENT & PLAN: Problem List Items Addressed This Visit       Cardiovascular and Mediastinum   Essential hypertension - Primary   BP Readings from Last 3 Encounters:  08/22/24 124/88  05/19/24 126/88  11/17/23 (!) 134/102  Well-controlled hypertension Continue amlodipine -valsartan  10-320 mg daily Cardiovascular risks associated with hypertension discussed Diet and nutrition discussed Benefits of exercise discussed Follow-up in 6 months         Other   Morbid obesity due to excess calories (HCC)   Diet and nutrition discussed Advised to decrease amount of daily carbohydrate intake and daily calories and increase amount of plant-based protein in her diet Benefits of exercise discussed Will benefit from GLP-1 agonist as per insurance formulary Recommend Zepbound  or Tzhncb      Patient Instructions  Hypertension, Adult High blood pressure (hypertension) is when the force of blood pumping through the arteries is too strong. The arteries are the blood vessels that carry blood from the heart throughout the body. Hypertension forces the heart to work harder to pump blood and may cause arteries to become narrow or stiff. Untreated or uncontrolled hypertension can lead to a heart attack, heart failure, a stroke, kidney disease, and other problems. A blood pressure reading consists of a higher number over a lower number. Ideally, your blood pressure should be below 120/80. The first (top) number is called the systolic pressure. It is a measure of the pressure in your arteries as your heart beats. The second (bottom) number is called the diastolic pressure. It is a measure of the pressure in your arteries as the heart relaxes. What are the  causes? The  exact cause of this condition is not known. There are some conditions that result in high blood pressure. What increases the risk? Certain factors may make you more likely to develop high blood pressure. Some of these risk factors are under your control, including: Smoking. Not getting enough exercise or physical activity. Being overweight. Having too much fat, sugar, calories, or salt (sodium) in your diet. Drinking too much alcohol. Other risk factors include: Having a personal history of heart disease, diabetes, high cholesterol, or kidney disease. Stress. Having a family history of high blood pressure and high cholesterol. Having obstructive sleep apnea. Age. The risk increases with age. What are the signs or symptoms? High blood pressure may not cause symptoms. Very high blood pressure (hypertensive crisis) may cause: Headache. Fast or irregular heartbeats (palpitations). Shortness of breath. Nosebleed. Nausea and vomiting. Vision changes. Severe chest pain, dizziness, and seizures. How is this diagnosed? This condition is diagnosed by measuring your blood pressure while you are seated, with your arm resting on a flat surface, your legs uncrossed, and your feet flat on the floor. The cuff of the blood pressure monitor will be placed directly against the skin of your upper arm at the level of your heart. Blood pressure should be measured at least twice using the same arm. Certain conditions can cause a difference in blood pressure between your right and left arms. If you have a high blood pressure reading during one visit or you have normal blood pressure with other risk factors, you may be asked to: Return on a different day to have your blood pressure checked again. Monitor your blood pressure at home for 1 week or longer. If you are diagnosed with hypertension, you may have other blood or imaging tests to help your health care provider understand your overall risk for  other conditions. How is this treated? This condition is treated by making healthy lifestyle changes, such as eating healthy foods, exercising more, and reducing your alcohol intake. You may be referred for counseling on a healthy diet and physical activity. Your health care provider may prescribe medicine if lifestyle changes are not enough to get your blood pressure under control and if: Your systolic blood pressure is above 130. Your diastolic blood pressure is above 80. Your personal target blood pressure may vary depending on your medical conditions, your age, and other factors. Follow these instructions at home: Eating and drinking  Eat a diet that is high in fiber and potassium, and low in sodium, added sugar, and fat. An example of this eating plan is called the DASH diet. DASH stands for Dietary Approaches to Stop Hypertension. To eat this way: Eat plenty of fresh fruits and vegetables. Try to fill one half of your plate at each meal with fruits and vegetables. Eat whole grains, such as whole-wheat pasta, brown rice, or whole-grain bread. Fill about one fourth of your plate with whole grains. Eat or drink low-fat dairy products, such as skim milk or low-fat yogurt. Avoid fatty cuts of meat, processed or cured meats, and poultry with skin. Fill about one fourth of your plate with lean proteins, such as fish, chicken without skin, beans, eggs, or tofu. Avoid pre-made and processed foods. These tend to be higher in sodium, added sugar, and fat. Reduce your daily sodium intake. Many people with hypertension should eat less than 1,500 mg of sodium a day. Do not drink alcohol if: Your health care provider tells you not to drink. You are pregnant, may be pregnant,  or are planning to become pregnant. If you drink alcohol: Limit how much you have to: 0-1 drink a day for women. 0-2 drinks a day for men. Know how much alcohol is in your drink. In the [place] one drink equals one 12 oz bottle of  beer (355 mL), one 5 oz glass of wine (148 mL), or one 1 oz glass of hard liquor (44 mL). Lifestyle  Work with your health care provider to maintain a healthy body weight or to lose weight. Ask what an ideal weight is for you. Get at least 30 minutes of exercise that causes your heart to beat faster (aerobic exercise) most days of the week. Activities may include walking, swimming, or biking. Include exercise to strengthen your muscles (resistance exercise), such as Pilates or lifting weights, as part of your weekly exercise routine. Try to do these types of exercises for 30 minutes at least 3 days a week. Do not use any products that contain nicotine or tobacco. These products include cigarettes, chewing tobacco, and vaping devices, such as e-cigarettes. If you need help quitting, ask your health care provider. Monitor your blood pressure at home as told by your health care provider. Keep all follow-up visits. This is important. Medicines Take over-the-counter and prescription medicines only as told by your health care provider. Follow directions carefully. Blood pressure medicines must be taken as prescribed. Do not skip doses of blood pressure medicine. Doing this puts you at risk for problems and can make the medicine less effective. Ask your health care provider about side effects or reactions to medicines that you should watch for. Contact a health care provider if you: Think you are having a reaction to a medicine you are taking. Have headaches that keep coming back (recurring). Feel dizzy. Have swelling in your ankles. Have trouble with your vision. Get help right away if you: Develop a severe headache or confusion. Have unusual weakness or numbness. Feel faint. Have severe pain in your chest or abdomen. Vomit repeatedly. Have trouble breathing. These symptoms may be an emergency. Get help right away. Call 911. Do not wait to see if the symptoms will go away. Do not drive  yourself to the hospital. Summary Hypertension is when the force of blood pumping through your arteries is too strong. If this condition is not controlled, it may put you at risk for serious complications. Your personal target blood pressure may vary depending on your medical conditions, your age, and other factors. For most people, a normal blood pressure is less than 120/80. Hypertension is treated with lifestyle changes, medicines, or a combination of both. Lifestyle changes include losing weight, eating a healthy, low-sodium diet, exercising more, and limiting alcohol. This information is not intended to replace advice given to you by your health care provider. Make sure you discuss any questions you have with your health care provider. Document Revised: 07/09/2021 Document Reviewed: 07/09/2021 Elsevier Patient Education  2024 Elsevier Inc.     Emil Schaumann, MD [facility]

## 2024-09-13 ENCOUNTER — Encounter: Payer: Self-pay | Admitting: Emergency Medicine

## 2024-09-13 ENCOUNTER — Ambulatory Visit (INDEPENDENT_AMBULATORY_CARE_PROVIDER_SITE_OTHER): Admitting: Emergency Medicine

## 2024-09-13 VITALS — BP 120/80 | HR 85 | Temp 100.0°F | Ht 63.0 in | Wt 199.0 lb

## 2024-09-13 DIAGNOSIS — J101 Influenza due to other identified influenza virus with other respiratory manifestations: Secondary | ICD-10-CM | POA: Insufficient documentation

## 2024-09-13 DIAGNOSIS — R6889 Other general symptoms and signs: Secondary | ICD-10-CM | POA: Insufficient documentation

## 2024-09-13 LAB — POC COVID19 BINAXNOW: SARS Coronavirus 2 Ag: NEGATIVE

## 2024-09-13 LAB — POCT INFLUENZA A/B
Influenza A, POC: NEGATIVE
Influenza B, POC: POSITIVE — AB

## 2024-09-13 MED ORDER — OSELTAMIVIR PHOSPHATE 75 MG PO CAPS: 75.0000 mg | ORAL_CAPSULE | Freq: Two times a day (BID) | ORAL | 0 refills | Status: AC

## 2024-09-13 MED ORDER — HYDROCODONE-ACETAMINOPHEN 5-325 MG PO TABS: 1.0000 | ORAL_TABLET | Freq: Four times a day (QID) | ORAL | 0 refills | Status: AC | PRN

## 2024-09-13 NOTE — Assessment & Plan Note (Signed)
 Clinically stable.  No red flag signs or symptoms. No complications detected Recommend Tamiflu 75 mg twice a day for 5 days Symptom management discussed Advised to rest and stay well-hydrated ED precautions given Advised to contact the office if no better or worse during the next several days

## 2024-09-13 NOTE — Progress Notes (Signed)
 Pamela Allen 48 y.o.   Chief Complaint  Patient presents with   URI    Body aches and fever, pt states that her symptoms stated Saturday     HISTORY OF PRESENT ILLNESS: Acute problem visit today. This is a 48 y.o. female complaining of acute onset flulike symptoms that started last Saturday late evening Mostly complaining of generalized achiness with fever and cough No other complaints or medical concerns today.  URI  Associated symptoms include congestion, coughing and headaches. Pertinent negatives include no abdominal pain, chest pain, diarrhea, dysuria, nausea, rash or vomiting.     Prior to Admission medications  Medication Sig Start Date End Date Taking? Authorizing Provider  ALPRAZolam  (XANAX ) 0.5 MG tablet TAKE 1 TABLET(0.5 MG) BY MOUTH TWICE DAILY AS NEEDED FOR ANXIETY Patient not taking: Reported on 08/22/2024 02/20/21   Coletta Lockner Jose, MD  amLODipine -valsartan  (EXFORGE ) 10-320 MG tablet Take 1 tablet by mouth daily. 11/17/23   Joselynn Amoroso Jose, MD  amoxicillin -clavulanate (AUGMENTIN ) 875-125 MG tablet Take 1 tablet by mouth every 12 (twelve) hours. Patient not taking: Reported on 08/22/2024 10/13/22   Emil Share, DO  cetirizine  (ZYRTEC ) 10 MG tablet Take 1 tablet (10 mg total) by mouth daily. 11/17/23   Purcell Emil Schanz, MD  escitalopram  (LEXAPRO ) 20 MG tablet TAKE 1 TABLET(20 MG) BY MOUTH DAILY Patient not taking: Reported on 08/22/2024 09/30/23   Amara Justen Jose, MD  fluconazole  (DIFLUCAN ) 150 MG tablet TAKE 1 TABLET(150 MG) BY MOUTH 1 TIME FOR 1 DOSE 02/04/24   Purcell Emil Schanz, MD  Fluticasone-Umeclidin-Vilant (TRELEGY ELLIPTA ) 100-62.5-25 MCG/ACT AEPB Inhale 1 puff into the lungs daily. Patient not taking: Reported on 08/22/2024 07/16/21   Plotnikov, Aleksei V, MD  ibuprofen  (ADVIL ) 800 MG tablet Take 1 tablet (800 mg total) by mouth every 8 (eight) hours as needed. Patient not taking: Reported on 08/22/2024 03/14/22   Cleotilde Ronal RAMAN, MD  montelukast   (SINGULAIR ) 10 MG tablet Take 1 tablet (10 mg total) by mouth at bedtime. 11/17/23   Purcell Emil Schanz, MD  Multiple Vitamins-Minerals (MULTIVITAMIN WOMEN 50+ PO) Take by mouth.    [provider]  norethindrone  (AYGESTIN ) 5 MG tablet Take 1 tablet (5 mg total) by mouth daily. Patient not taking: Reported on 08/22/2024 07/14/22   Cleotilde Ronal RAMAN, MD  Olopatadine  HCl (PATADAY ) 0.2 % SOLN Apply 1 drop to eye daily. 03/23/19   Nishtha Raider Jose, MD  tirzepatide  (ZEPBOUND ) 2.5 MG/0.5ML Pen Inject 2.5 mg into the skin once a week. 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   Vitamin D  deficiency 11/11/2014   Essential hypertension 11/21/2011    Past Medical History:  Diagnosis Date   Allergy    Anemia    yrs ago with pregnancy 28 yrs ago    Anxiety    Breast fibroadenoma    Hypertension     Past Surgical History:  Procedure Laterality Date   BREAST BIOPSY Left 2017  DILITATION & CURRETTAGE/HYSTROSCOPY WITH NOVASURE ABLATION N/A 02/12/2022   Procedure: /HYSTEROSCOPY, POLYP RESECTION WITH MYOSURE, NOVASURE ABLATION;  Surgeon: Cleotilde Ronal RAMAN, MD;  Location: [facility];  Service: Gynecology;  Laterality: N/A;   MASS EXCISION Left 01/17/2016   Procedure: EXCISION BIOPSY OF MASS LEFT BUTTOCK ;  Surgeon: Bernarda Ned, MD;  Location: [facility];  Service: General;  Laterality: Left;   TUBAL LIGATION Bilateral  10/30/2003   w/ Bilateral Salpingectomy   WISDOM TOOTH EXTRACTION  2017   lft side    WISDOM TOOTH EXTRACTION Right 09/22/2022    Social History   Socioeconomic History   Marital status: Married    Spouse name: Not on file   Number of children: Not on file   Years of education: Not on file   Highest education level: 12th grade  Occupational History   Not on file  Tobacco Use   Smoking status: Never   Smokeless tobacco: Never  Vaping Use   Vaping status: Never Used  Substance and Sexual Activity   Alcohol use: Yes    Comment: occasional   Drug use: No   Sexual activity: Not on file  Other Topics Concern   Not on file  Social History Narrative   Married   Museum/gallery Conservator / CNA   Social Drivers of Health   Tobacco Use: Low Risk (09/13/2024)   Patient History    Smoking Tobacco Use: Never    Smokeless Tobacco Use: Never    Passive Exposure: Not on file  Financial Resource Strain: Low Risk (09/11/2024)   Overall Financial Resource Strain (CARDIA)    Difficulty of Paying Living Expenses: Not very hard  Food Insecurity: No Food Insecurity (09/11/2024)   Epic    Worried About Radiation Protection Practitioner of Food in the Last Year: Never true    Ran Out of Food in the Last Year: Never true  Transportation Needs: No Transportation Needs (09/11/2024)   Epic    Lack of Transportation (Medical): No    Lack of Transportation (Non-Medical): No  Physical Activity: Insufficiently Active (09/11/2024)   Exercise Vital Sign    Days of Exercise per Week: 3 days    Minutes of Exercise per Session: 30 min  Stress: No Stress Concern Present (09/11/2024)   [employer] of Occupational Health - Occupational Stress Questionnaire    Feeling of Stress: Only a little  Social Connections: Moderately Isolated (09/11/2024)   Social Connection and Isolation Panel    Frequency of Communication with Friends and Family: More than three times a week    Frequency of Social Gatherings with  Friends and Family: More than three times a week    Attends Religious Services: More than 4 times per year    Active Member of [employer] or Organizations: No    Attends Engineer, Structural: Not on file    Marital Status: Separated  Intimate Partner Violence: Not on file  Depression (PHQ2-9): Low Risk (08/22/2024)   Depression (PHQ2-9)    PHQ-2 Score: 1  Alcohol Screen: Low Risk (09/11/2024)   Alcohol Screen    Last Alcohol Screening Score (AUDIT): 1  Housing: Low Risk (09/11/2024)   Epic    Unable to Pay for Housing in the Last Year: No    Number of Times Moved in the Last Year: 0    Homeless in the Last Year: No  Utilities: Not on file  Health Literacy: Not on file    Family History  Problem Relation Age of Onset   Diabetes Mother    Heart disease Mother    Hypertension Mother    Stroke Mother    Deep vein thrombosis Mother    Cancer Father    Hypertension Father    Deep vein thrombosis Sister    Colon polyps Maternal Uncle    Colon cancer Neg Hx    Esophageal cancer Neg Hx    Rectal cancer Neg Hx    Stomach cancer Neg Hx      Review of Systems  Constitutional:  Positive for chills and fever.  HENT:  Positive for congestion.   Respiratory:  Positive for cough.   Cardiovascular: Negative.  Negative for chest pain and palpitations.  Gastrointestinal:  Negative for abdominal pain, diarrhea, nausea and vomiting.  Genitourinary: Negative.  Negative for dysuria and hematuria.  Musculoskeletal:  Positive for myalgias.  Skin: Negative.  Negative for rash.  Neurological:  Positive for headaches.    Vitals:   09/13/24 1607  BP: 120/80  Pulse: 85  Temp: 100 F (37.8 C)  SpO2: 98%    Physical Exam Vitals reviewed.  Constitutional:      Appearance: Normal appearance.  HENT:     Head: Normocephalic.     Mouth/Throat:     Mouth: Mucous membranes are moist.     Pharynx: Oropharynx is clear.  Eyes:     Extraocular Movements: Extraocular movements intact.      Pupils: Pupils are equal, round, and reactive to light.  Cardiovascular:     Rate and Rhythm: Normal rate and regular rhythm.     Pulses: Normal pulses.     Heart sounds: Normal heart sounds.  Pulmonary:     Effort: Pulmonary effort is normal.     Breath sounds: Normal breath sounds.  Skin:    General: Skin is warm and dry.  Neurological:     Mental Status: She is alert and oriented to person, place, and time.  Psychiatric:        Mood and Affect: Mood normal.        Behavior: Behavior normal.    Results for orders placed or performed in visit on 09/13/24 (from the past 24 hours)  POCT Influenza A/B     Status: Abnormal   Collection Time: 09/13/24  4:20 PM  Result Value Ref Range   Influenza A, POC Negative Negative   Influenza B, POC Positive (A) Negative  POC COVID-19     Status: Normal   Collection Time: 09/13/24  4:20 PM  Result Value Ref Range   SARS Coronavirus 2 Ag Negative Negative     ASSESSMENT & PLAN: Problem List Items Addressed This Visit       Respiratory   Influenza B - Primary   Clinically stable.  No red flag signs or symptoms. No complications detected Recommend Tamiflu 75 mg twice a day for 5 days Symptom management discussed Advised to rest and stay well-hydrated ED precautions given Advised to contact the office if no better or worse during the next several days      Relevant Medications   oseltamivir (TAMIFLU) 75 MG capsule   HYDROcodone -acetaminophen  (NORCO/VICODIN) 5-325 MG tablet     Other   Flu-like symptoms   Symptom management discussed Advised to rest and stay well-hydrated Recommend Tylenol  and or Advil  for mild to moderate pain and Norco for moderate to severe pain.      Relevant Medications   oseltamivir (TAMIFLU) 75 MG capsule   HYDROcodone -acetaminophen  (NORCO/VICODIN)  5-325 MG tablet   Other Relevant Orders   POCT Influenza A/B (Completed)   POC COVID-19 (Completed)   Patient Instructions  Influenza, Adult Influenza  is also called the flu. It's an infection that affects your respiratory tract. This includes your nose, throat, windpipe, and lungs. The flu is contagious. This means it spreads easily from person to person. It causes symptoms that are like a cold. It can also cause a high fever and body aches. What are the causes? The flu is caused by the influenza virus. You can get it by: Breathing in droplets that are in the air after an infected person coughs or sneezes. Touching something that has the virus on it and then touching your mouth, nose, or eyes. What increases the risk? You may be more likely to get the flu if: You don't wash your hands often. You're near a lot of people during cold and flu season. You touch your mouth, eyes, or nose without washing your hands first. You don't get a flu shot each year. You may also be more at risk for the flu and serious problems, such as a lung infection called pneumonia, if: You're older than 65. You're pregnant. Your immune system is weak. Your immune system is your body's defense system. You have a long-term, or chronic, condition, such as: Heart, kidney, or lung disease. Diabetes. A liver disorder. Asthma. You're very overweight. You have anemia. This is when you don't have enough red blood cells in your body. What are the signs or symptoms? Flu symptoms often start all of a sudden. They may last 4-14 days and include: Fever and chills. Headaches, body aches, or muscle aches. Sore throat. Cough. Runny or stuffy nose. Discomfort in your chest. Not wanting to eat as much as normal. Feeling weak or tired. Feeling dizzy. Nausea or vomiting. How is this diagnosed? The flu may be diagnosed based on your symptoms and medical history. You may also have a physical exam. A swab may be taken from your nose or throat and tested for the virus. How is this treated? If the flu is found early, you can be treated with antiviral medicine. This may be given  to you by mouth or through an IV. It can help you feel less sick and get better faster. Taking care of yourself at home can also help your symptoms get better. Your health care provider may tell you to: Take over-the-counter medicines. Drink lots of fluids. The flu often goes away on its own. If you have very bad symptoms or problems caused by the flu, you may need to be treated in a hospital. Follow these instructions at home: Activity Rest as needed. Get lots of sleep. Stay home from work or school as told by your provider. Leave home only to go see your provider. Do not leave home for other reasons until you don't have a fever for 24 hours without taking medicine. Eating and drinking Take an oral rehydration solution (ORS). This is a drink that is sold at pharmacies and stores. Drink enough fluid to keep your pee pale yellow. Try to drink small amounts of clear fluids. These include water, ice chips, fruit juice mixed with water, and low-calorie sports drinks. Try to eat bland foods that are easy to digest. These include bananas, applesauce, rice, lean meats, toast, and crackers. Avoid drinks that have a lot of sugar or caffeine in them. These include energy drinks, regular sports drinks, and soda. Do not drink alcohol. Do not  eat spicy or fatty foods. General instructions     Take your medicines only as told by your provider. Use a cool mist humidifier to add moisture to the air in your home. This can make it easier for you to breathe. You should also clean the humidifier every day. To do so: Empty the water. Pour clean water in. Cover your mouth and nose when you cough or sneeze. Wash your hands with soap and water often and for at least 20 seconds. It's extra important to do so after you cough or sneeze. If you can't use soap and water, use hand sanitizer. How is this prevented?  Get a flu shot every year. Ask your provider when you should get your flu shot. Stay away from  people who are sick during fall and winter. Fall and winter are cold and flu season. Contact a health care provider if: You get new symptoms. You have chest pain. You have watery poop, also called diarrhea. You have a fever. Your cough gets worse. You start to have more mucus. You feel like you may vomit, or you vomit. Get help right away if: You become short of breath or have trouble breathing. Your skin or nails turn blue. You have very bad pain or stiffness in your neck. You get a sudden headache or pain in your face or ear. You vomit each time you eat or drink. These symptoms may be an emergency. Call 911 right away. Do not wait to see if the symptoms will go away. Do not drive yourself to the hospital. This information is not intended to replace advice given to you by your health care provider. Make sure you discuss any questions you have with your health care provider. Document Revised: 06/04/2023 Document Reviewed: 10/09/2022 Elsevier Patient Education  2024 Elsevier Inc.     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

## 2024-09-13 NOTE — Patient Instructions (Signed)

## 2024-09-13 NOTE — Assessment & Plan Note (Signed)
 Symptom management discussed Advised to rest and stay well-hydrated Recommend Tylenol  and or Advil  for mild to moderate pain and Norco for moderate to severe pain.

## 2024-09-29 ENCOUNTER — Other Ambulatory Visit: Payer: Self-pay | Admitting: Medical Genetics

## 2024-09-29 DIAGNOSIS — Z006 Encounter for examination for normal comparison and control in clinical research program: Secondary | ICD-10-CM

## 2024-10-06 ENCOUNTER — Encounter: Payer: Self-pay | Admitting: Emergency Medicine

## 2024-10-07 NOTE — Telephone Encounter (Signed)
 She is not diabetic.  Insurance will not cover Ozempic unless you are diabetic.  Zepbound  is exclusively for obese patients and weight loss.

## 2024-10-07 NOTE — Telephone Encounter (Signed)
 Please advise.

## 2024-10-12 NOTE — Telephone Encounter (Signed)
 Please advise.

## 2024-10-12 NOTE — Telephone Encounter (Signed)
 Zepbound  is indicated for weight loss.  It is okay to use it if covered by insurance or patient willing to pay for it.

## 2024-10-20 LAB — GENECONNECT MOLECULAR SCREEN
